# Patient Record
Sex: Female | Born: 1940 | Race: White | Hispanic: No | Marital: Married | Smoking: Former smoker
Health system: Southern US, Community
[De-identification: ages and names within clinical notes are randomized; demographics above are authoritative.]

## PROBLEM LIST (undated history)

## (undated) DIAGNOSIS — M810 Age-related osteoporosis without current pathological fracture: Secondary | ICD-10-CM

## (undated) DIAGNOSIS — I499 Cardiac arrhythmia, unspecified: Secondary | ICD-10-CM

## (undated) DIAGNOSIS — H269 Unspecified cataract: Secondary | ICD-10-CM

## (undated) DIAGNOSIS — I482 Chronic atrial fibrillation, unspecified: Secondary | ICD-10-CM

## (undated) DIAGNOSIS — E039 Hypothyroidism, unspecified: Secondary | ICD-10-CM

## (undated) DIAGNOSIS — N390 Urinary tract infection, site not specified: Secondary | ICD-10-CM

## (undated) DIAGNOSIS — M1990 Unspecified osteoarthritis, unspecified site: Secondary | ICD-10-CM

## (undated) DIAGNOSIS — T7840XA Allergy, unspecified, initial encounter: Secondary | ICD-10-CM

## (undated) DIAGNOSIS — I38 Endocarditis, valve unspecified: Secondary | ICD-10-CM

## (undated) DIAGNOSIS — E079 Disorder of thyroid, unspecified: Secondary | ICD-10-CM

## (undated) HISTORY — DX: Allergy, unspecified, initial encounter: T78.40XA

## (undated) HISTORY — DX: Disorder of thyroid, unspecified: E07.9

## (undated) HISTORY — DX: Unspecified osteoarthritis, unspecified site: M19.90

## (undated) HISTORY — PX: TUBAL LIGATION: SHX77

## (undated) HISTORY — DX: Unspecified cataract: H26.9

## (undated) HISTORY — PX: COLONOSCOPY: SHX174

## (undated) HISTORY — DX: Age-related osteoporosis without current pathological fracture: M81.0

## (undated) HISTORY — DX: Chronic atrial fibrillation, unspecified: I48.20

---

## 2000-04-22 ENCOUNTER — Encounter: Payer: Self-pay | Admitting: Emergency Medicine

## 2000-04-22 ENCOUNTER — Emergency Department (HOSPITAL_COMMUNITY): Admission: EM | Admit: 2000-04-22 | Discharge: 2000-04-22 | Payer: Self-pay | Admitting: Emergency Medicine

## 2012-03-04 ENCOUNTER — Ambulatory Visit (INDEPENDENT_AMBULATORY_CARE_PROVIDER_SITE_OTHER): Payer: Medicare Other | Admitting: Emergency Medicine

## 2012-03-04 VITALS — BP 122/80 | HR 72 | Temp 97.6°F | Resp 16 | Ht 60.5 in | Wt 124.6 lb

## 2012-03-04 DIAGNOSIS — E039 Hypothyroidism, unspecified: Secondary | ICD-10-CM

## 2012-03-04 LAB — TSH: TSH: 96.356 u[IU]/mL — ABNORMAL HIGH (ref 0.350–4.500)

## 2012-03-04 NOTE — Progress Notes (Signed)
   Date:  03/04/2012   Name:  Kelly Mcdowell   DOB:  24-Sep-1940   MRN:  [id number] Gender: female Age: 71 y.o.  PCP:  No primary provider on file.    Chief Complaint: Labs Only   History of Present Illness:  Kelly Mcdowell is a 71 y.o. pleasant patient who presents with the following:  History of hypothyroidism.  Has come in today for labs and a refill on her medications.  Has not had a level done in nearly one year. Not symptomatic.  No complaints.  There is no problem list on file for this patient.   No past medical history on file.  No past surgical history on file.  History  Substance Use Topics  . Smoking status: Former Smoker    Quit date: 03/04/2005  . Smokeless tobacco: Not on file  . Alcohol Use: Not on file    No family history on file.  No Known Allergies  Medication list has been reviewed and updated.  Current Outpatient Prescriptions on File Prior to Visit  Medication Sig Dispense Refill  . Calcium Carbonate-Vitamin D (CALCIUM-VITAMIN D) 500-200 MG-UNIT per tablet Take 1 tablet by mouth 2 (two) times daily with a meal.      . levothyroxine (SYNTHROID, LEVOTHROID) 75 MCG tablet Take 75 mcg by mouth daily.        Review of Systems:  As per HPI, otherwise negative.    Physical Examination: Filed Vitals:   03/04/12 1338  BP: 122/80  Pulse: 72  Temp: 97.6 F (36.4 C)  Resp: 16   Filed Vitals:   03/04/12 1338  Height: 5' 0.5" (1.537 m)  Weight: 124 lb 9.6 oz (56.518 kg)   Body mass index is 23.93 kg/(m^2). Ideal Body Weight: Weight in (lb) to have BMI = 25: 129.9    GEN: WDWN, NAD, Non-toxic, Alert & Oriented x 3 HEENT: Atraumatic, Normocephalic.  Ears and Nose: No external deformity. EXTR: No clubbing/cyanosis/edema NEURO: Normal gait.  PSYCH: Normally interactive. Conversant. Not depressed or anxious appearing.  Calm demeanor.    Assessment and Plan: Hypothyroid Labs Call in med refill based on labs Follow up as needed  Carmelina Dane, MD

## 2012-03-05 ENCOUNTER — Other Ambulatory Visit: Payer: Self-pay | Admitting: Emergency Medicine

## 2012-03-05 MED ORDER — SYNTHROID 125 MCG PO TABS: 125.0000 ug | ORAL_TABLET | Freq: Every day | ORAL | Status: DC

## 2012-06-18 ENCOUNTER — Ambulatory Visit (INDEPENDENT_AMBULATORY_CARE_PROVIDER_SITE_OTHER): Payer: Medicare Other | Admitting: Emergency Medicine

## 2012-06-18 VITALS — BP 102/66 | HR 63 | Temp 98.2°F | Resp 17 | Ht 60.0 in | Wt 125.0 lb

## 2012-06-18 DIAGNOSIS — E039 Hypothyroidism, unspecified: Secondary | ICD-10-CM

## 2012-06-18 LAB — TSH: TSH: 0.043 u[IU]/mL — ABNORMAL LOW (ref 0.350–4.500)

## 2012-06-18 NOTE — Progress Notes (Signed)
[facility] [address] [place] [address] [phone]  Date:  06/18/2012   Name:  Kelly Mcdowell   DOB:  10/18/1940   MRN:  [id number]  PCP:  No primary provider on file.    Chief Complaint: Follow-up   History of Present Illness:  Kelly Mcdowell is a 71 y.o. very pleasant female patient who presents with the following:  Seen in September with TSH over 90 and increased her synthroid.  Now for recheck.  Says she is not as cold as she was but has not detected any difference in skin, hair, energy, fatigue, or mood with increased dose.  Weight is stable.  Not constipated.  Patient Active Problem List  Diagnosis  . Hypothyroidism (acquired)    No past medical history on file.  No past surgical history on file.  History  Substance Use Topics  . Smoking status: Former Smoker    Quit date: 03/04/2005  . Smokeless tobacco: Not on file  . Alcohol Use: Not on file    No family history on file.  No Known Allergies  Medication list has been reviewed and updated.  Current Outpatient Prescriptions on File Prior to Visit  Medication Sig Dispense Refill  . Calcium Carbonate-Vitamin D (CALCIUM-VITAMIN D) 500-200 MG-UNIT per tablet Take 1 tablet by mouth 2 (two) times daily with a meal.      . Multiple Vitamins-Minerals (MULTIVITAMIN WITH MINERALS) tablet Take 1 tablet by mouth daily.      Marland Kitchen SYNTHROID 125 MCG tablet Take 1 tablet (125 mcg total) by mouth daily.  90 tablet  0    Review of Systems:  As per HPI, otherwise negative.    Physical Examination: Filed Vitals:   06/18/12 1045  BP: 102/66  Pulse: 63  Temp: 98.2 F (36.8 C)  Resp: 17   Filed Vitals:   06/18/12 1045  Height: 5' (1.524 m)  Weight: 125 lb (56.7 kg)   Body mass index is 24.41 kg/(m^2). Ideal Body Weight: Weight in (lb) to have BMI = 25: 127.7   GEN: WDWN, NAD, Non-toxic, A & O x 3 HEENT: Atraumatic, Normocephalic. Neck supple. No masses, No LAD. Ears and Nose: No  external deformity. Neck:  No thyromegaly CV: RRR, No M/G/R. No JVD. No thrill. No extra heart sounds. PULM: CTA B, no wheezes, crackles, rhonchi. No retractions. No resp. distress. No accessory muscle use. ABD: S, NT, ND, +BS. No rebound. No HSM. EXTR: No c/c/e NEURO Normal gait.  PSYCH: Normally interactive. Conversant. Not depressed or anxious appearing.  Calm demeanor.    Assessment and Plan: Hypothyroidism Labs Follow up based on labs  Carmelina Dane, MD

## 2012-06-20 ENCOUNTER — Other Ambulatory Visit: Payer: Self-pay | Admitting: Emergency Medicine

## 2012-06-20 MED ORDER — CALCIUM-VITAMIN D 500-200 MG-UNIT PO TABS: 1.0000 | ORAL_TABLET | Freq: Two times a day (BID) | ORAL | Status: DC

## 2012-06-20 MED ORDER — SYNTHROID 100 MCG PO TABS: 100.0000 ug | ORAL_TABLET | Freq: Every day | ORAL | Status: DC

## 2012-10-02 ENCOUNTER — Ambulatory Visit (INDEPENDENT_AMBULATORY_CARE_PROVIDER_SITE_OTHER): Payer: Medicare Other | Admitting: Emergency Medicine

## 2012-10-02 VITALS — BP 112/62 | HR 56 | Temp 98.1°F | Resp 16 | Ht 60.5 in | Wt 125.0 lb

## 2012-10-02 DIAGNOSIS — E039 Hypothyroidism, unspecified: Secondary | ICD-10-CM | POA: Diagnosis not present

## 2012-10-02 LAB — TSH: TSH: 0.092 u[IU]/mL — ABNORMAL LOW (ref 0.350–4.500)

## 2012-10-02 NOTE — Progress Notes (Signed)
Patient here for labs only. 

## 2012-10-04 ENCOUNTER — Other Ambulatory Visit: Payer: Self-pay

## 2012-10-04 MED ORDER — LEVOTHYROXINE SODIUM 88 MCG PO TABS: 88.0000 ug | ORAL_TABLET | Freq: Every day | ORAL | Status: DC

## 2012-10-04 MED ORDER — SYNTHROID 100 MCG PO TABS: 100.0000 ug | ORAL_TABLET | Freq: Every day | ORAL | Status: DC

## 2012-10-04 NOTE — Addendum Note (Signed)
Addended by: Carmelina Dane on: 10/04/2012 03:31 PM   Modules accepted: Orders

## 2012-10-04 NOTE — Telephone Encounter (Signed)
Pt CB and verified that she has been taking the 100 mcg, not the 88 mcg, name brand only. Dr Moishe Spice the 100 mcg in lab results and I have sent in the RFs.

## 2013-06-01 ENCOUNTER — Telehealth: Payer: Self-pay

## 2013-06-01 NOTE — Telephone Encounter (Signed)
Patient calling to refill synthroid. States she called last week and we told her to call pharmacy and have pharmacy send request. She called pharmacy who states they sent request with no response from [place]. Told patient to call pharmacy and make sure they sent it electronically and that I would put in request as well. Knows she needs OV for blood work but she is not feeling well and would like someone to rx one month until she can come in to be seen.

## 2013-06-02 MED ORDER — SYNTHROID 100 MCG PO TABS: 100.0000 ug | ORAL_TABLET | Freq: Every day | ORAL | Status: DC

## 2013-06-02 NOTE — Telephone Encounter (Signed)
There has been nothing from the pharmacy.  I have sent her in a 1 month supply.

## 2013-06-02 NOTE — Telephone Encounter (Signed)
Pt.notified

## 2013-06-05 ENCOUNTER — Ambulatory Visit (INDEPENDENT_AMBULATORY_CARE_PROVIDER_SITE_OTHER): Payer: Medicare Other | Admitting: Family Medicine

## 2013-06-05 ENCOUNTER — Ambulatory Visit: Payer: Medicare Other

## 2013-06-05 VITALS — BP 110/70 | HR 65 | Temp 98.3°F | Resp 18 | Ht 60.5 in | Wt 130.0 lb

## 2013-06-05 DIAGNOSIS — M25559 Pain in unspecified hip: Secondary | ICD-10-CM | POA: Diagnosis not present

## 2013-06-05 DIAGNOSIS — M7062 Trochanteric bursitis, left hip: Secondary | ICD-10-CM

## 2013-06-05 DIAGNOSIS — M25552 Pain in left hip: Secondary | ICD-10-CM

## 2013-06-05 DIAGNOSIS — E039 Hypothyroidism, unspecified: Secondary | ICD-10-CM | POA: Diagnosis not present

## 2013-06-05 MED ORDER — SYNTHROID 100 MCG PO TABS: 100.0000 ug | ORAL_TABLET | Freq: Every day | ORAL | Status: DC

## 2013-06-05 MED ORDER — DICLOFENAC SODIUM 75 MG PO TBEC: 75.0000 mg | DELAYED_RELEASE_TABLET | Freq: Two times a day (BID) | ORAL | Status: DC

## 2013-06-05 NOTE — Patient Instructions (Addendum)
Apply ice tonight and tomorrow as directed  Take diclofenac 75 mg one twice daily for pain and inflammation. You can take Tylenol in addition to that if needed.  Return if not improved  Continue same dose of thyroid medicine. Return as directed or in one year

## 2013-06-05 NOTE — Progress Notes (Signed)
Subjective Patient has been having problems for a couple of months with her left hip hurting her. When she lays on that side it hurts. The pain goes from the lateral area of the hip down into the upper lateral thigh. Knows of no specific injury.  She also is out of her lowering medications. She missed a couple doses this weekend until was called in. She would like a renewed prescription for that. She is overdue to get checked for that.  Objective: Pleasant alert lady in no distress. The thyroid is not enlarged. Chest clear. Heart regular without murmurs. Her abdomen was soft. The left hip is painful laterally and tender over the greater trochanter. Straight leg raise test is negative.  Assessment: Pain left hip, probably trochanteric bursitis Hypothyroidism  Plan: TSH X-ray hip  [facility] reading (PRIMARY) by  Dr. Alwyn Ren Normal pelvis and hip joints.. The greater trochanters on both sides have a little prominence of calcifications consistent with a little calcification of the tendons in that area.   Assessment: Probable trochanter bursitiS    Plan: Inject with cortisone  Procedure note: The surgery was explained to the patient and she agrees, knowing that it may hurt more the next day or 2. She was prepped, using Betadine and alcohol. The area was superficially anesthetized with ethyl chloride. 40 mg of Depo-Medrol and 1 cc of 2% lidocaine were injected without difficulty into the trigger point area of the greater trochanter. This tolerated the procedure well.   She will be given a little course of diclofenac to take for pain and inflammation

## 2013-06-07 ENCOUNTER — Encounter: Payer: Self-pay | Admitting: Family Medicine

## 2013-08-30 ENCOUNTER — Other Ambulatory Visit: Payer: Self-pay | Admitting: Family Medicine

## 2013-08-30 DIAGNOSIS — Z1231 Encounter for screening mammogram for malignant neoplasm of breast: Secondary | ICD-10-CM

## 2013-09-05 ENCOUNTER — Ambulatory Visit (HOSPITAL_COMMUNITY): Payer: Self-pay

## 2013-09-16 DIAGNOSIS — M545 Low back pain, unspecified: Secondary | ICD-10-CM | POA: Diagnosis not present

## 2013-09-16 DIAGNOSIS — M25559 Pain in unspecified hip: Secondary | ICD-10-CM | POA: Diagnosis not present

## 2013-09-16 DIAGNOSIS — M79605 Pain in left leg: Secondary | ICD-10-CM | POA: Insufficient documentation

## 2013-09-18 ENCOUNTER — Ambulatory Visit (HOSPITAL_COMMUNITY): Payer: Self-pay

## 2013-09-23 DIAGNOSIS — M25559 Pain in unspecified hip: Secondary | ICD-10-CM | POA: Diagnosis not present

## 2013-09-23 DIAGNOSIS — M7150 Other bursitis, not elsewhere classified, unspecified site: Secondary | ICD-10-CM | POA: Diagnosis not present

## 2013-09-26 DIAGNOSIS — M25559 Pain in unspecified hip: Secondary | ICD-10-CM | POA: Diagnosis not present

## 2013-09-26 DIAGNOSIS — M7150 Other bursitis, not elsewhere classified, unspecified site: Secondary | ICD-10-CM | POA: Diagnosis not present

## 2014-03-28 DIAGNOSIS — H2510 Age-related nuclear cataract, unspecified eye: Secondary | ICD-10-CM | POA: Diagnosis not present

## 2014-03-28 DIAGNOSIS — H40039 Anatomical narrow angle, unspecified eye: Secondary | ICD-10-CM | POA: Diagnosis not present

## 2014-03-28 DIAGNOSIS — H01029 Squamous blepharitis unspecified eye, unspecified eyelid: Secondary | ICD-10-CM | POA: Diagnosis not present

## 2014-04-04 DIAGNOSIS — H40033 Anatomical narrow angle, bilateral: Secondary | ICD-10-CM | POA: Diagnosis not present

## 2014-04-18 DIAGNOSIS — H40033 Anatomical narrow angle, bilateral: Secondary | ICD-10-CM | POA: Diagnosis not present

## 2014-04-18 DIAGNOSIS — H2513 Age-related nuclear cataract, bilateral: Secondary | ICD-10-CM | POA: Diagnosis not present

## 2014-04-18 DIAGNOSIS — H35373 Puckering of macula, bilateral: Secondary | ICD-10-CM | POA: Diagnosis not present

## 2014-04-18 DIAGNOSIS — H01022 Squamous blepharitis right lower eyelid: Secondary | ICD-10-CM | POA: Diagnosis not present

## 2014-06-21 ENCOUNTER — Other Ambulatory Visit: Payer: Self-pay | Admitting: Family Medicine

## 2014-08-11 ENCOUNTER — Ambulatory Visit (INDEPENDENT_AMBULATORY_CARE_PROVIDER_SITE_OTHER): Payer: Medicare Other | Admitting: Family Medicine

## 2014-08-11 VITALS — BP 116/70 | HR 65 | Temp 97.7°F | Resp 18 | Ht 60.5 in | Wt 118.4 lb

## 2014-08-11 DIAGNOSIS — R002 Palpitations: Secondary | ICD-10-CM

## 2014-08-11 DIAGNOSIS — E039 Hypothyroidism, unspecified: Secondary | ICD-10-CM | POA: Diagnosis not present

## 2014-08-11 DIAGNOSIS — L299 Pruritus, unspecified: Secondary | ICD-10-CM

## 2014-08-11 MED ORDER — SYNTHROID 100 MCG PO TABS: 100.0000 ug | ORAL_TABLET | Freq: Every day | ORAL | Status: DC

## 2014-08-11 MED ORDER — HYDROXYZINE HCL 25 MG PO TABS: ORAL_TABLET | ORAL | Status: DC

## 2014-08-11 NOTE — Progress Notes (Addendum)
[facility] [address], [place] [address] [phone]  Date:  08/11/2014   Name:  Kelly Mcdowell   DOB:  1941-04-25   MRN:  [id number]  PCP:  No primary care provider on file.    Chief Complaint: Follow-up   History of Present Illness:  Kelly Mcdowell is a 74 y.o. very pleasant female patient who presents with the following:  Here today to follow-up her TSH. Last TSH 06/2013.  She is on 100 mcg; she has been on this for about one year and has been taking it consistently.  She feels well, she does not notice any suggestion that her dosage needs adjustment.   Sometimes at night she will feel like she is having heart palpations. She does not notice any chest pain. Palpitations may last 10- 15 minutes.  Sitting up or distracting herself generally helps.  She does not notice these during the day She has noted the palpitations for about 2 months.   She is not aware of any heart trouble. No chest pain with activity.    Patient Active Problem List   Diagnosis Date Noted  . Hypothyroidism (acquired) 06/18/2012    Past Medical History  Diagnosis Date  . Thyroid disease   . Arthritis   . Chronic kidney disease     Past Surgical History  Procedure Laterality Date  . Tubal ligation    . Cesarean section      History  Substance Use Topics  . Smoking status: Former Smoker    Quit date: 03/04/2005  . Smokeless tobacco: Not on file  . Alcohol Use: No    Family History  Problem Relation Age of Onset  . Congestive Heart Failure Father   . Cancer Sister     breast  . Cancer Maternal Grandmother     No Known Allergies  Medication list has been reviewed and updated.  Current Outpatient Prescriptions on File Prior to Visit  Medication Sig Dispense Refill  . Calcium Carbonate-Vitamin D (CALCIUM-VITAMIN D) 500-200 MG-UNIT per tablet Take 1 tablet by mouth 2 (two) times daily with a meal. 60 tablet 5  . diclofenac (VOLTAREN) 75 MG EC tablet Take 1  tablet (75 mg total) by mouth 2 (two) times daily. 20 tablet 0  . Multiple Vitamins-Minerals (MULTIVITAMIN WITH MINERALS) tablet Take 1 tablet by mouth daily.    Marland Kitchen SYNTHROID 100 MCG tablet Take 1 tablet (100 mcg total) by mouth daily. PATIENT NEEDS OFFICE VISIT FOR ADDITIONAL REFILLS 30 tablet 0   No current facility-administered medications on file prior to visit.    Review of Systems:  As per HPI- otherwise negative.   Physical Examination: Filed Vitals:   08/11/14 1419  BP: 116/70  Pulse: 65  Temp: 97.7 F (36.5 C)  Resp: 18   Filed Vitals:   08/11/14 1419  Height: 5' 0.5" (1.537 m)  Weight: 118 lb 6.4 oz (53.706 kg)   Body mass index is 22.73 kg/(m^2). Ideal Body Weight: Weight in (lb) to have BMI = 25: 129.9  GEN: WDWN, NAD, Non-toxic, A & O x 3, slight build, looks well HEENT: Atraumatic, Normocephalic. Neck supple. No masses, No LAD. Ears and Nose: No external deformity. CV: RRR, No M/G/R. No JVD. No thrill. No extra heart sounds. PULM: CTA B, no wheezes, crackles, rhonchi. No retractions. No resp. distress. No accessory muscle use. ABD: S, NT, ND, +BS. No rebound. No HSM. EXTR: No c/c/e NEURO Normal gait.  PSYCH: Normally interactive.  Conversant. Not depressed or anxious appearing.  Calm demeanor.   EKG:  NSR, rate of 59.  q in V2 but no old for comparison  Assessment and Plan: Acquired hypothyroidism - Plan: EKG 12-Lead, SYNTHROID 100 MCG tablet  Palpitations - Plan: TSH  Itching - Plan: hydrOXYzine (ATARAX/VISTARIL) 25 MG tablet  Check TSH and adjust dose if needed. Also refilled her atarax that she uses for occasional itching Palpitations could be due to thyroid issue, await level.  Discussed abnormal EKG with her. It is possible that she could have had an MI.  She doubts this as she has never had any CP.  Offered cardiology referral but she prefers to wait on TSH.  Showed her how to feel her radial pulse. She will check this when she feels palpitations to  see if her rhythm is irregular Will plan further follow- up pending labs.   Signed Abbe AmsterdamJessica Copland, MD  2/9: received her TSH,  Called and LMOM that her level is ok, continue same dosage.  Please let me know if she would like to go ahead with cardiology referral  Results for orders placed or performed in visit on 08/11/14  TSH  Result Value Ref Range   TSH 1.112 0.350 - 4.500 uIU/mL

## 2014-08-11 NOTE — Patient Instructions (Signed)
I will be in touch with your thyroid labs when they come in. Your EKG looks fine- let me know if you want further evaluation for your heart palpitations assuming that your thyroid is normal

## 2014-08-12 ENCOUNTER — Encounter: Payer: Self-pay | Admitting: Family Medicine

## 2014-08-12 LAB — TSH: TSH: 1.112 u[IU]/mL (ref 0.350–4.500)

## 2014-10-29 ENCOUNTER — Telehealth: Payer: Self-pay | Admitting: Family Medicine

## 2014-10-29 ENCOUNTER — Encounter: Payer: Self-pay | Admitting: Family Medicine

## 2014-10-30 NOTE — Telephone Encounter (Signed)
Opened note in error.

## 2015-04-27 DIAGNOSIS — H35373 Puckering of macula, bilateral: Secondary | ICD-10-CM | POA: Diagnosis not present

## 2015-04-27 DIAGNOSIS — H40033 Anatomical narrow angle, bilateral: Secondary | ICD-10-CM | POA: Diagnosis not present

## 2015-04-27 DIAGNOSIS — H2513 Age-related nuclear cataract, bilateral: Secondary | ICD-10-CM | POA: Diagnosis not present

## 2015-09-27 ENCOUNTER — Other Ambulatory Visit: Payer: Self-pay | Admitting: Family Medicine

## 2015-10-28 ENCOUNTER — Ambulatory Visit (INDEPENDENT_AMBULATORY_CARE_PROVIDER_SITE_OTHER): Payer: Medicare Other | Admitting: Physician Assistant

## 2015-10-28 VITALS — BP 112/74 | HR 70 | Temp 97.6°F | Resp 18 | Ht 60.5 in | Wt 120.2 lb

## 2015-10-28 DIAGNOSIS — E039 Hypothyroidism, unspecified: Secondary | ICD-10-CM

## 2015-10-28 LAB — TSH: TSH: 0.95 m[IU]/L

## 2015-10-28 MED ORDER — SYNTHROID 100 MCG PO TABS: 100.0000 ug | ORAL_TABLET | Freq: Every day | ORAL | Status: DC

## 2015-10-28 NOTE — Progress Notes (Signed)
   10/28/2015 12:04 PM   DOB: 1941-05-16 / MRN: [id number]  SUBJECTIVE:  Kelly Mcdowell is a 75 y.o. female presenting for a thyroid check.  She is taking Levothyroxine 100 mcg daily.  CHL reveals no significant change in weight or vitals.  She denies significant hair and nail changes, and does not complain of diarrhea or constipation.   She has No Known Allergies.   She  has a past medical history of Thyroid disease; Arthritis; and Chronic kidney disease.    She  reports that she quit smoking about 10 years ago. She does not have any smokeless tobacco history on file. She reports that she does not drink alcohol or use illicit drugs. She  reports that she does not engage in sexual activity. The patient  has past surgical history that includes Tubal ligation and Cesarean section.  Her family history includes Cancer in her maternal grandmother and sister; Congestive Heart Failure in her father.  ROS   Per HPI  Problem list and medications reviewed and updated by myself where necessary, and exist elsewhere in the encounter.   OBJECTIVE:  BP 112/74 mmHg  Pulse 70  Temp(Src) 97.6 F (36.4 C) (Oral)  Resp 18  Ht 5' 0.5" (1.537 m)  Wt 120 lb 3.2 oz (54.522 kg)  BMI 23.08 kg/m2  SpO2 98%  Physical Exam  Constitutional: She is oriented to person, place, and time. She appears well-nourished. No distress.  Eyes: EOM are normal. Pupils are equal, round, and reactive to light.  Neck: No thyroid mass and no thyromegaly present.  Cardiovascular: Normal rate.   Pulmonary/Chest: Effort normal.  Abdominal: She exhibits no distension.  Neurological: She is alert and oriented to person, place, and time. No cranial nerve deficit. Gait normal.  Skin: Skin is dry. She is not diaphoretic.  Psychiatric: She has a normal mood and affect.  Vitals reviewed.   No results found for this or any previous visit (from the past 72 hour(s)).  No results found.  ASSESSMENT AND PLAN  Para MarchJeanette was seen  today for medication refill.  Diagnoses and all orders for this visit:  Hypothyroidism, unspecified hypothyroidism type: She is doing very well.  Will refill for one year.   -     TSH -     SYNTHROID 100 MCG tablet; Take 1 tablet (100 mcg total) by mouth daily before breakfast. Return to clinic in April 2018 for refills.    The patient was advised to call or return to clinic if she does not see an improvement in symptoms or to seek the care of the closest emergency department if she worsens with the above plan.   Deliah BostonMichael Javious Hallisey, MHS, PA-C [facility] 10/28/2015 12:04 PM

## 2015-10-28 NOTE — Patient Instructions (Signed)
     IF you received an x-ray today, you will receive an invoice from [facility]. Please contact [facility] at [phone] with questions or concerns regarding your invoice.   IF you received labwork today, you will receive an invoice from [facility]. Please contact [facility] at [phone] with questions or concerns regarding your invoice.   Our billing staff will not be able to assist you with questions regarding bills from these companies.  You will be contacted with the lab results as soon as they are available. The fastest way to get your results is to activate your My Chart account. Instructions are located on the last page of this paperwork. If you have not heard from us regarding the results in 2 weeks, please contact this office.      

## 2016-04-27 DIAGNOSIS — H35373 Puckering of macula, bilateral: Secondary | ICD-10-CM | POA: Diagnosis not present

## 2016-04-27 DIAGNOSIS — H2513 Age-related nuclear cataract, bilateral: Secondary | ICD-10-CM | POA: Diagnosis not present

## 2016-04-27 DIAGNOSIS — H40033 Anatomical narrow angle, bilateral: Secondary | ICD-10-CM | POA: Diagnosis not present

## 2016-12-15 DIAGNOSIS — E039 Hypothyroidism, unspecified: Secondary | ICD-10-CM | POA: Insufficient documentation

## 2016-12-15 DIAGNOSIS — Z13820 Encounter for screening for osteoporosis: Secondary | ICD-10-CM | POA: Diagnosis not present

## 2016-12-15 DIAGNOSIS — Z13220 Encounter for screening for lipoid disorders: Secondary | ICD-10-CM | POA: Diagnosis not present

## 2016-12-15 DIAGNOSIS — Z1231 Encounter for screening mammogram for malignant neoplasm of breast: Secondary | ICD-10-CM | POA: Diagnosis not present

## 2016-12-15 DIAGNOSIS — Z1211 Encounter for screening for malignant neoplasm of colon: Secondary | ICD-10-CM | POA: Diagnosis not present

## 2016-12-15 DIAGNOSIS — E559 Vitamin D deficiency, unspecified: Secondary | ICD-10-CM | POA: Diagnosis not present

## 2016-12-16 ENCOUNTER — Other Ambulatory Visit: Payer: Self-pay | Admitting: Nurse Practitioner

## 2016-12-16 DIAGNOSIS — Z1211 Encounter for screening for malignant neoplasm of colon: Secondary | ICD-10-CM

## 2016-12-16 DIAGNOSIS — R5381 Other malaise: Secondary | ICD-10-CM

## 2016-12-24 DIAGNOSIS — E039 Hypothyroidism, unspecified: Secondary | ICD-10-CM | POA: Diagnosis not present

## 2017-03-13 ENCOUNTER — Encounter: Payer: Self-pay | Admitting: Family Medicine

## 2017-03-13 ENCOUNTER — Ambulatory Visit (INDEPENDENT_AMBULATORY_CARE_PROVIDER_SITE_OTHER): Payer: Medicare Other | Admitting: Family Medicine

## 2017-03-13 VITALS — BP 138/68 | HR 63 | Temp 97.7°F | Resp 16 | Ht 60.24 in | Wt 118.0 lb

## 2017-03-13 DIAGNOSIS — Z1239 Encounter for other screening for malignant neoplasm of breast: Secondary | ICD-10-CM

## 2017-03-13 DIAGNOSIS — Z1211 Encounter for screening for malignant neoplasm of colon: Secondary | ICD-10-CM

## 2017-03-13 DIAGNOSIS — Z13820 Encounter for screening for osteoporosis: Secondary | ICD-10-CM | POA: Diagnosis not present

## 2017-03-13 DIAGNOSIS — Z1231 Encounter for screening mammogram for malignant neoplasm of breast: Secondary | ICD-10-CM | POA: Diagnosis not present

## 2017-03-13 DIAGNOSIS — E039 Hypothyroidism, unspecified: Secondary | ICD-10-CM | POA: Diagnosis not present

## 2017-03-13 DIAGNOSIS — E2839 Other primary ovarian failure: Secondary | ICD-10-CM

## 2017-03-13 DIAGNOSIS — Z23 Encounter for immunization: Secondary | ICD-10-CM | POA: Diagnosis not present

## 2017-03-13 MED ORDER — LEVOTHYROXINE SODIUM 100 MCG PO TABS
100.0000 ug | ORAL_TABLET | Freq: Every day | ORAL | 1 refills | Status: DC
Start: 2017-03-13 — End: 2017-03-29

## 2017-03-13 NOTE — Progress Notes (Signed)
Subjective:    Patient ID: Kelly Mcdowell, female    DOB: 10-05-1940, 76 y.o.   MRN: [id number]  HPI Kelly Mcdowell is a 76 y.o. female Presents today for: Chief Complaint  Patient presents with  . Hypothyroidism    follow-up   History of hypothyroidism, here for medication refill and labs. I'm listed as her primary care provider, but no recent visit with me.. Takes Synthroid 100 MCG daily. Was seen by urgent care recently, did have bloodwork and results were ok. Did have generic Rx, but had been taking name brand prior.  Has been on generic  Lab Results  Component Value Date   TSH 0.95 10/28/2015  tsh: 2.31, free t4 2.8 on 12/15/16.   Health maintenance: Reviewed notes from [facility]: Planned to refer for mammogram, bone density, and referred to gastroenterology, but only if she was planning to continue care at that office.   Patient Active Problem List   Diagnosis Date Noted  . Hypothyroidism (acquired) 06/18/2012   Past Medical History:  Diagnosis Date  . Arthritis   . Chronic kidney disease   . Thyroid disease    Past Surgical History:  Procedure Laterality Date  . CESAREAN SECTION    . TUBAL LIGATION     No Known Allergies Prior to Admission medications   Medication Sig Start Date End Date Taking? Authorizing Provider  Calcium Carbonate-Vitamin D (CALCIUM-VITAMIN D) 500-200 MG-UNIT per tablet Take 1 tablet by mouth 2 (two) times daily with a meal. 06/20/12  Yes Carmelina Dane, MD  hydrOXYzine (ATARAX/VISTARIL) 25 MG tablet Take 1/2 tablet to one whole tablet by mouth daily as needed for itching 08/11/14  Yes Copland, Gwenlyn Found, MD  Multiple Vitamins-Minerals (MULTIVITAMIN WITH MINERALS) tablet Take 1 tablet by mouth daily.   Yes [provider]  SYNTHROID 100 MCG tablet Take 1 tablet (100 mcg total) by mouth daily before breakfast. Return to clinic in April 2018 for refills. 10/28/15  Yes Ofilia Neas, PA-C   Social History   Social History    . Marital status: Married    Spouse name: N/A  . Number of children: N/A  . Years of education: N/A   Occupational History  . Not on file.   Social History Main Topics  . Smoking status: Former Smoker    Quit date: 03/04/2005  . Smokeless tobacco: Never Used  . Alcohol use No  . Drug use: No  . Sexual activity: No   Other Topics Concern  . Not on file   Social History Narrative  . No narrative on file     Review of Systems  Constitutional: Negative for fatigue and unexpected weight change.  Respiratory: Negative for chest tightness and shortness of breath.   Cardiovascular: Negative for chest pain, palpitations and leg swelling.  Gastrointestinal: Negative for abdominal pain and blood in stool.  Neurological: Negative for dizziness, syncope, light-headedness and headaches.       Objective:   Physical Exam  Constitutional: She is oriented to person, place, and time. She appears well-developed and well-nourished.  HENT:  Head: Normocephalic and atraumatic.  Eyes: Pupils are equal, round, and reactive to light. Conjunctivae and EOM are normal.  Neck: Neck supple. Carotid bruit is not present. No thyromegaly present.  Cardiovascular: Normal rate, regular rhythm, normal heart sounds and intact distal pulses.   Pulmonary/Chest: Effort normal and breath sounds normal.  Abdominal: Soft. She exhibits no pulsatile midline mass. There is no tenderness.  Neurological: She  is alert and oriented to person, place, and time.  Skin: Skin is warm and dry.  Psychiatric: She has a normal mood and affect. Her behavior is normal.  Vitals reviewed.  Vitals:   03/13/17 1406 03/13/17 1407  BP: (!) 154/63 138/68  Pulse: 63   Resp: 16   Temp: 97.7 F (36.5 C)   TempSrc: Oral   SpO2: 98%   Weight: 118 lb (53.5 kg)   Height: 5' 0.24" (1.53 m)         Assessment & Plan:   Kelly Mcdowell is a 76 y.o. female Hypothyroidism, unspecified type - Plan: TSH, levothyroxine (SYNTHROID)  100 MCG tablet  - Check TSH, continue same dose of Synthroid for now. Suspect TSH will be stable, but as switched from brand name to generic, will check TSH for stability  Screen for colon cancer - Plan: Ambulatory referral to Gastroenterology  Screening for breast cancer - Plan: MM Digital Screening  Need for prophylactic vaccination against Streptococcus pneumoniae (pneumococcus) - Plan: Pneumococcal conjugate vaccine 13-valent IM  Screening for osteoporosis - Plan: DG Bone Density Estrogen deficiency - Plan: DG Bone Density   Meds ordered this encounter  Medications  . levothyroxine (SYNTHROID) 100 MCG tablet    Sig: Take 1 tablet (100 mcg total) by mouth daily before breakfast.    Dispense:  90 tablet    Refill:  1   Patient Instructions   No change in thyroid medication dosing for now. Since you were switched from name brand to generic, I will repeat your thyroid test today. I referred you for colonoscopy, bone density screening, and mammogram. First of 2 pneumonia vaccines given today. Please schedule a physical in the next 3-6 months. If you change your mind on flu vaccine, return here or other location in community prescribing that medication.   IF you received an x-ray today, you will receive an invoice from [facility]. Please contact [facility] at [phone] with questions or concerns regarding your invoice.   IF you received labwork today, you will receive an invoice from [facility]. Please contact [facility] at [phone] with questions or concerns regarding your invoice.   Our billing staff will not be able to assist you with questions regarding bills from these companies.  You will be contacted with the lab results as soon as they are available. The fastest way to get your results is to activate your My Chart account. Instructions are located on the last page of this paperwork. If you have not heard from us regarding the results in 2 weeks, please  contact this office.       I personally performed the services described in this documentation, which was scribed in my presence. The recorded information has been reviewed and considered for accuracy and completeness, addended by me as needed, and agree with information above.  Signed,   Meredith StaggersJeffrey Kortny Lirette, MD [facility].  03/15/17 4:42 PM

## 2017-03-13 NOTE — Patient Instructions (Addendum)
No change in thyroid medication dosing for now. Since you were switched from name brand to generic, I will repeat your thyroid test today. I referred you for colonoscopy, bone density screening, and mammogram. First of 2 pneumonia vaccines given today. Please schedule a physical in the next 3-6 months. If you change your mind on flu vaccine, return here or other location in community prescribing that medication.   IF you received an x-ray today, you will receive an invoice from [facility]. Please contact [facility] at [phone] with questions or concerns regarding your invoice.   IF you received labwork today, you will receive an invoice from [facility]. Please contact [facility] at [phone] with questions or concerns regarding your invoice.   Our billing staff will not be able to assist you with questions regarding bills from these companies.  You will be contacted with the lab results as soon as they are available. The fastest way to get your results is to activate your My Chart account. Instructions are located on the last page of this paperwork. If you have not heard from us regarding the results in 2 weeks, please contact this office.

## 2017-03-14 LAB — TSH: TSH: 0.33 u[IU]/mL — ABNORMAL LOW (ref 0.450–4.500)

## 2017-03-17 ENCOUNTER — Other Ambulatory Visit: Payer: Self-pay | Admitting: Family Medicine

## 2017-03-17 DIAGNOSIS — E2839 Other primary ovarian failure: Secondary | ICD-10-CM

## 2017-03-19 ENCOUNTER — Other Ambulatory Visit: Payer: Self-pay | Admitting: Family Medicine

## 2017-03-19 DIAGNOSIS — E039 Hypothyroidism, unspecified: Secondary | ICD-10-CM

## 2017-03-19 NOTE — Progress Notes (Signed)
Low TSH on recent labs, orders placed for lab only visit.

## 2017-03-21 ENCOUNTER — Ambulatory Visit (INDEPENDENT_AMBULATORY_CARE_PROVIDER_SITE_OTHER): Payer: Medicare Other | Admitting: Family Medicine

## 2017-03-21 DIAGNOSIS — E039 Hypothyroidism, unspecified: Secondary | ICD-10-CM | POA: Diagnosis not present

## 2017-03-22 LAB — TSH+FREE T4
FREE T4: 2.02 ng/dL — AB (ref 0.82–1.77)
TSH: 0.244 u[IU]/mL — AB (ref 0.450–4.500)

## 2017-03-22 LAB — T3, FREE: T3 FREE: 3.1 pg/mL (ref 2.0–4.4)

## 2017-03-24 ENCOUNTER — Encounter: Payer: Self-pay | Admitting: Gastroenterology

## 2017-03-24 NOTE — Progress Notes (Signed)
Lab visit only. 

## 2017-03-29 ENCOUNTER — Other Ambulatory Visit: Payer: Self-pay | Admitting: Family Medicine

## 2017-03-29 DIAGNOSIS — E039 Hypothyroidism, unspecified: Secondary | ICD-10-CM

## 2017-03-29 MED ORDER — LEVOTHYROXINE SODIUM 88 MCG PO TABS: 88.0000 ug | ORAL_TABLET | Freq: Every day | ORAL | 1 refills | Status: DC

## 2017-03-29 NOTE — Progress Notes (Signed)
See lab notes.  Low tsh, elevated free t4. Will decrease to dose of synthroid, recheck levels in 6 weeks.

## 2017-03-30 ENCOUNTER — Ambulatory Visit
Admission: RE | Admit: 2017-03-30 | Discharge: 2017-03-30 | Disposition: A | Discharge status: Home / Self Care | Payer: Medicare Other | Source: Ambulatory Visit | Attending: Family Medicine | Admitting: Family Medicine

## 2017-03-30 DIAGNOSIS — Z1239 Encounter for other screening for malignant neoplasm of breast: Secondary | ICD-10-CM

## 2017-03-30 DIAGNOSIS — Z1231 Encounter for screening mammogram for malignant neoplasm of breast: Secondary | ICD-10-CM | POA: Diagnosis not present

## 2017-04-03 ENCOUNTER — Other Ambulatory Visit: Payer: Self-pay | Admitting: Family Medicine

## 2017-04-03 DIAGNOSIS — R928 Other abnormal and inconclusive findings on diagnostic imaging of breast: Secondary | ICD-10-CM

## 2017-04-06 ENCOUNTER — Ambulatory Visit: Payer: Medicare Other

## 2017-04-06 ENCOUNTER — Ambulatory Visit
Admission: RE | Admit: 2017-04-06 | Discharge: 2017-04-06 | Disposition: A | Discharge status: Home / Self Care | Payer: Medicare Other | Source: Ambulatory Visit | Attending: Family Medicine | Admitting: Family Medicine

## 2017-04-06 DIAGNOSIS — R928 Other abnormal and inconclusive findings on diagnostic imaging of breast: Secondary | ICD-10-CM | POA: Diagnosis not present

## 2017-04-19 ENCOUNTER — Ambulatory Visit
Admission: RE | Admit: 2017-04-19 | Discharge: 2017-04-19 | Disposition: A | Discharge status: Home / Self Care | Payer: Medicare Other | Source: Ambulatory Visit | Attending: Family Medicine | Admitting: Family Medicine

## 2017-04-19 DIAGNOSIS — Z780 Asymptomatic menopausal state: Secondary | ICD-10-CM | POA: Diagnosis not present

## 2017-04-19 DIAGNOSIS — M810 Age-related osteoporosis without current pathological fracture: Secondary | ICD-10-CM | POA: Diagnosis not present

## 2017-04-19 DIAGNOSIS — E2839 Other primary ovarian failure: Secondary | ICD-10-CM

## 2017-04-28 DIAGNOSIS — H35373 Puckering of macula, bilateral: Secondary | ICD-10-CM | POA: Diagnosis not present

## 2017-04-28 DIAGNOSIS — H40033 Anatomical narrow angle, bilateral: Secondary | ICD-10-CM | POA: Diagnosis not present

## 2017-04-28 DIAGNOSIS — H2513 Age-related nuclear cataract, bilateral: Secondary | ICD-10-CM | POA: Diagnosis not present

## 2017-05-02 ENCOUNTER — Ambulatory Visit (INDEPENDENT_AMBULATORY_CARE_PROVIDER_SITE_OTHER): Payer: Medicare Other

## 2017-05-02 ENCOUNTER — Telehealth: Payer: Self-pay

## 2017-05-02 VITALS — BP 140/80 | HR 80 | Temp 97.8°F | Ht 60.0 in | Wt 117.4 lb

## 2017-05-02 DIAGNOSIS — Z0000 Encounter for general adult medical examination without abnormal findings: Secondary | ICD-10-CM | POA: Diagnosis not present

## 2017-05-02 NOTE — Telephone Encounter (Signed)
FYI- Patient was here today for her AWV and I was prepared to order a Lipid, CMP and CBC on this patient. Patient stated that she has an appointment with you next week for recheck on her thyroid and wanted those levels checked also. I explained to patient that thyroid levels are not apart of my standing orders and she wants to have all the blood work drawn at her appointment on 05/09/17 with you so she only gets stuck once.

## 2017-05-02 NOTE — Patient Instructions (Addendum)
Ms. Kelly Mcdowell , Thank you for taking time to come for your Medicare Wellness Visit. I appreciate your ongoing commitment to your health goals. Please review the following plan we discussed and let me know if I can assist you in the future.   Screening recommendations/referrals: Colonoscopy: due, You state you have this scheduled for May 22, 2017. Mammogram: up to date, next due 04/07/2019 Bone Density: up to date, next due 04/19/2022 Recommended yearly ophthalmology/optometry visit for glaucoma screening and checkup Recommended yearly dental visit for hygiene and checkup  Vaccinations: Influenza vaccine: declined Pneumococcal vaccine: up to date, Pneumovax 23 due after 03/13/2018 Tdap vaccine: declined due to insurance Shingles vaccine: Check with your pharmacy about receiving this.     Advanced directives: copy in chart  Conditions/risks identified: Try to improve your posture and hopefully you will be able to stand straight up.    Next appointment: 05/09/17 @ 10:20 am with Dr. Neva SeatGreene   Preventive Care 65 Years and Older, Female Preventive care refers to lifestyle choices and visits with your health care provider that can promote health and wellness. What does preventive care include?  A yearly physical exam. This is also called an annual well check.  Dental exams once or twice a year.  Routine eye exams. Ask your health care provider how often you should have your eyes checked.  Personal lifestyle choices, including:  Daily care of your teeth and gums.  Regular physical activity.  Eating a healthy diet.  Avoiding tobacco and drug use.  Limiting alcohol use.  Practicing safe sex.  Taking low-dose aspirin every day.  Taking vitamin and mineral supplements as recommended by your health care provider. What happens during an annual well check? The services and screenings done by your health care provider during your annual well check will depend on your age, overall health,  lifestyle risk factors, and family history of disease. Counseling  Your health care provider may ask you questions about your:  Alcohol use.  Tobacco use.  Drug use.  Emotional well-being.  Home and relationship well-being.  Sexual activity.  Eating habits.  History of falls.  Memory and ability to understand (cognition).  Work and work Astronomerenvironment.  Reproductive health. Screening  You may have the following tests or measurements:  Height, weight, and BMI.  Blood pressure.  Lipid and cholesterol levels. These may be checked every 5 years, or more frequently if you are over 74 years old.  Skin check.  Lung cancer screening. You may have this screening every year starting at age 74 if you have a 30-pack-year history of smoking and currently smoke or have quit within the past 15 years.  Fecal occult blood test (FOBT) of the stool. You may have this test every year starting at age 74.  Flexible sigmoidoscopy or colonoscopy. You may have a sigmoidoscopy every 5 years or a colonoscopy every 10 years starting at age 74.  Hepatitis C blood test.  Hepatitis B blood test.  Sexually transmitted disease (STD) testing.  Diabetes screening. This is done by checking your blood sugar (glucose) after you have not eaten for a while (fasting). You may have this done every 1-3 years.  Bone density scan. This is done to screen for osteoporosis. You may have this done starting at age 74.  Mammogram. This may be done every 1-2 years. Talk to your health care provider about how often you should have regular mammograms. Talk with your health care provider about your test results, treatment options, and if  necessary, the need for more tests. Vaccines  Your health care provider may recommend certain vaccines, such as:  Influenza vaccine. This is recommended every year.  Tetanus, diphtheria, and acellular pertussis (Tdap, Td) vaccine. You may need a Td booster every 10 years.  Zoster  vaccine. You may need this after age 24.  Pneumococcal 13-valent conjugate (PCV13) vaccine. One dose is recommended after age 27.  Pneumococcal polysaccharide (PPSV23) vaccine. One dose is recommended after age 23. Talk to your health care provider about which screenings and vaccines you need and how often you need them. This information is not intended to replace advice given to you by your health care provider. Make sure you discuss any questions you have with your health care provider. Document Released: 07/17/2015 Document Revised: 03/09/2016 Document Reviewed: 04/21/2015 Elsevier Interactive Patient Education  2017 [place] Prevention in the Home Falls can cause injuries. They can happen to people of all ages. There are many things you can do to make your home safe and to help prevent falls. What can I do on the outside of my home?  Regularly fix the edges of walkways and driveways and fix any cracks.  Remove anything that might make you trip as you walk through a door, such as a raised step or threshold.  Trim any bushes or trees on the path to your home.  Use bright outdoor lighting.  Clear any walking paths of anything that might make someone trip, such as rocks or tools.  Regularly check to see if handrails are loose or broken. Make sure that both sides of any steps have handrails.  Any raised decks and porches should have guardrails on the edges.  Have any leaves, snow, or ice cleared regularly.  Use sand or salt on walking paths during winter.  Clean up any spills in your garage right away. This includes oil or grease spills. What can I do in the bathroom?  Use night lights.  Install grab bars by the toilet and in the tub and shower. Do not use towel bars as grab bars.  Use non-skid mats or decals in the tub or shower.  If you need to sit down in the shower, use a plastic, non-slip stool.  Keep the floor dry. Clean up any water that spills on the  floor as soon as it happens.  Remove soap buildup in the tub or shower regularly.  Attach bath mats securely with double-sided non-slip rug tape.  Do not have throw rugs and other things on the floor that can make you trip. What can I do in the bedroom?  Use night lights.  Make sure that you have a light by your bed that is easy to reach.  Do not use any sheets or blankets that are too big for your bed. They should not hang down onto the floor.  Have a firm chair that has side arms. You can use this for support while you get dressed.  Do not have throw rugs and other things on the floor that can make you trip. What can I do in the kitchen?  Clean up any spills right away.  Avoid walking on wet floors.  Keep items that you use a lot in easy-to-reach places.  If you need to reach something above you, use a strong step stool that has a grab bar.  Keep electrical cords out of the way.  Do not use floor polish or wax that makes floors slippery. If you must  use wax, use non-skid floor wax.  Do not have throw rugs and other things on the floor that can make you trip. What can I do with my stairs?  Do not leave any items on the stairs.  Make sure that there are handrails on both sides of the stairs and use them. Fix handrails that are broken or loose. Make sure that handrails are as long as the stairways.  Check any carpeting to make sure that it is firmly attached to the stairs. Fix any carpet that is loose or worn.  Avoid having throw rugs at the top or bottom of the stairs. If you do have throw rugs, attach them to the floor with carpet tape.  Make sure that you have a light switch at the top of the stairs and the bottom of the stairs. If you do not have them, ask someone to add them for you. What else can I do to help prevent falls?  Wear shoes that:  Do not have high heels.  Have rubber bottoms.  Are comfortable and fit you well.  Are closed at the toe. Do not wear  sandals.  If you use a stepladder:  Make sure that it is fully opened. Do not climb a closed stepladder.  Make sure that both sides of the stepladder are locked into place.  Ask someone to hold it for you, if possible.  Clearly mark and make sure that you can see:  Any grab bars or handrails.  First and last steps.  Where the edge of each step is.  Use tools that help you move around (mobility aids) if they are needed. These include:  Canes.  Walkers.  Scooters.  Crutches.  Turn on the lights when you go into a dark area. Replace any light bulbs as soon as they burn out.  Set up your furniture so you have a clear path. Avoid moving your furniture around.  If any of your floors are uneven, fix them.  If there are any pets around you, be aware of where they are.  Review your medicines with your doctor. Some medicines can make you feel dizzy. This can increase your chance of falling. Ask your doctor what other things that you can do to help prevent falls. This information is not intended to replace advice given to you by your health care provider. Make sure you discuss any questions you have with your health care provider. Document Released: 04/16/2009 Document Revised: 11/26/2015 Document Reviewed: 07/25/2014 Elsevier Interactive Patient Education  2017 [employer].

## 2017-05-02 NOTE — Telephone Encounter (Signed)
No problem. I'm happy to do the blood work next week. I did adjust her Synthroid last visit, so can recheck those levels at that time. Thank you.

## 2017-05-02 NOTE — Progress Notes (Signed)
Subjective:   Kelly Mcdowell is a 76 y.o. female who presents for an Initial Medicare Annual Wellness Visit.  Review of Systems    N/A  Cardiac Risk Factors include: advanced age (>[id number], 39>65 women)     Objective:    Today's Vitals   05/02/17 1027  BP: 140/80  Pulse: 80  Temp: 97.8 F (36.6 C)  TempSrc: Oral  SpO2: 96%  Weight: 117 lb 6 oz (53.2 kg)  Height: 5' (1.524 m)   Body mass index is 22.92 kg/m.   Current Medications (verified) Outpatient Encounter Prescriptions as of 05/02/2017  Medication Sig  . Calcium Carbonate-Vitamin D (CALCIUM-VITAMIN D) 500-200 MG-UNIT per tablet Take 1 tablet by mouth 2 (two) times daily with a meal.  . hydrOXYzine (ATARAX/VISTARIL) 25 MG tablet Take 1/2 tablet to one whole tablet by mouth daily as needed for itching  . levothyroxine (SYNTHROID, LEVOTHROID) 88 MCG tablet Take 1 tablet (88 mcg total) by mouth daily before breakfast.  . Multiple Vitamins-Minerals (MULTIVITAMIN WITH MINERALS) tablet Take 1 tablet by mouth daily.   No facility-administered encounter medications on file as of 05/02/2017.     Allergies (verified) Patient has no known allergies.   History: Past Medical History:  Diagnosis Date  . Arthritis   . Chronic kidney disease   . Thyroid disease    Past Surgical History:  Procedure Laterality Date  . CESAREAN SECTION    . TUBAL LIGATION     Family History  Problem Relation Age of Onset  . Congestive Heart Failure Father   . Cancer Sister        breast  . Breast cancer Sister   . Cancer Maternal Grandmother   . Breast cancer Maternal Aunt    Social History   Occupational History  . Not on file.   Social History Main Topics  . Smoking status: Former Smoker    Quit date: 03/04/2005  . Smokeless tobacco: Never Used  . Alcohol use No  . Drug use: No  . Sexual activity: No    Tobacco Counseling Counseling given: Not Answered   Activities of Daily Living In your present state of health, do  you have any difficulty performing the following activities: 05/02/2017  Hearing? N  Vision? N  Difficulty concentrating or making decisions? N  Walking or climbing stairs? N  Dressing or bathing? N  Doing errands, shopping? N  Preparing Food and eating ? N  Using the Toilet? N  In the past six months, have you accidently leaked urine? Y  Comment Wears a pantyliner sometimes for this issue  Do you have problems with loss of bowel control? Y  Comment has issues with loss of bowel control sometimes  Managing your Medications? N  Managing your Finances? N  Housekeeping or managing your Housekeeping? N  Some recent data might be hidden    Immunizations and Health Maintenance Immunization History  Administered Date(s) Administered  . Pneumococcal Conjugate-13 03/13/2017   Health Maintenance Due  Topic Date Due  . COLONOSCOPY  04/23/1993    Patient Care Team: Shade FloodGreene, Jeffrey R, MD as PCP - General (Family Medicine) Sallye LatGroat, Christopher, MD as Consulting Physician (Ophthalmology)  Indicate any recent Medical Services you may have received from other than [facility] providers in the past year (date may be approximate).     Assessment:   This is a routine wellness examination for Kelly Mcdowell.   Hearing/Vision screen Vision Screening Comments: Patient sees Dr. Dione BoozeGroat for routine eye exams. She goes on  a yearly basis.   Dietary issues and exercise activities discussed: Current Exercise Habits: The patient does not participate in regular exercise at present (stays active at work), Exercise limited by: None identified  Goals    . improve posture          Patient states that she wants to try to improve her posture and be able to stand straight up.       Depression Screen PHQ 2/9 Scores 05/02/2017 03/13/2017 10/28/2015  PHQ - 2 Score 0 0 0    Fall Risk Fall Risk  05/02/2017 03/13/2017 10/28/2015  Falls in the past year? Yes Yes No  Number falls in past yr: 2 or more 1 -  Injury with  Fall? No No -  Risk for fall due to : (No Data) - -  Risk for fall due to: Comment wearing flip flops tripped and fell - -  Follow up Falls prevention discussed - -    Cognitive Function:     6CIT Screen 05/02/2017  What Year? 0 points  What month? 0 points  What time? 0 points  Count back from 20 0 points  Months in reverse 0 points  Repeat phrase 0 points  Total Score 0    Screening Tests Health Maintenance  Topic Date Due  . COLONOSCOPY  04/23/1993  . INFLUENZA VACCINE  05/02/2018 (Originally 02/01/2017)  . TETANUS/TDAP  05/02/2018 (Originally 04/23/1962)  . PNA vac Low Risk Adult (2 of 2 - PPSV23) 03/13/2018  . MAMMOGRAM  03/31/2019  . DEXA SCAN  Completed      Plan:   I have personally reviewed and noted the following in the patient's chart:   . Medical and social history . Use of alcohol, tobacco or illicit drugs  . Current medications and supplements . Functional ability and status . Nutritional status . Physical activity . Advanced directives . List of other physicians . Hospitalizations, surgeries, and ER visits in previous 12 months . Vitals . Screenings to include cognitive, depression, and falls . Referrals and appointments  In addition, I have reviewed and discussed with patient certain preventive protocols, quality metrics, and best practice recommendations. A written personalized care plan for preventive services as well as general preventive health recommendations were provided to patient.  Patient has a colonoscopy scheduled for May 22, 2017.  Patient declined flu vaccine Patient wants to have a blood work completed at her visit next week with her PCP. Message sent to Dr. Neva Seat notifying him of this.    Janalyn Shy, LPN   [id number]

## 2017-05-08 ENCOUNTER — Ambulatory Visit (AMBULATORY_SURGERY_CENTER): Payer: Self-pay | Admitting: *Deleted

## 2017-05-08 VITALS — Ht 60.0 in | Wt 117.8 lb

## 2017-05-08 DIAGNOSIS — Z1211 Encounter for screening for malignant neoplasm of colon: Secondary | ICD-10-CM

## 2017-05-08 MED ORDER — NA SULFATE-K SULFATE-MG SULF 17.5-3.13-1.6 GM/177ML PO SOLN: 1.0000 [IU] | Freq: Once | ORAL | 0 refills | Status: AC

## 2017-05-08 NOTE — Progress Notes (Signed)
No egg or soy allergy known to patient  No issues with past sedation with any surgeries  or procedures, no intubation problems  No diet pills per patient No home 02 use per patient  No blood thinners per patient  Pt denies issues with constipation  No A fib or A flutter  EMMI video sent to pt's e mail  Pt. declined 

## 2017-05-09 ENCOUNTER — Encounter: Payer: Self-pay | Admitting: Family Medicine

## 2017-05-09 ENCOUNTER — Ambulatory Visit (INDEPENDENT_AMBULATORY_CARE_PROVIDER_SITE_OTHER): Payer: Medicare Other | Admitting: Family Medicine

## 2017-05-09 VITALS — BP 128/78 | HR 69 | Temp 98.2°F | Resp 17 | Ht 60.0 in | Wt 118.0 lb

## 2017-05-09 DIAGNOSIS — R9431 Abnormal electrocardiogram [ECG] [EKG]: Secondary | ICD-10-CM | POA: Diagnosis not present

## 2017-05-09 DIAGNOSIS — R55 Syncope and collapse: Secondary | ICD-10-CM | POA: Diagnosis not present

## 2017-05-09 DIAGNOSIS — Z131 Encounter for screening for diabetes mellitus: Secondary | ICD-10-CM

## 2017-05-09 DIAGNOSIS — Z13220 Encounter for screening for lipoid disorders: Secondary | ICD-10-CM

## 2017-05-09 DIAGNOSIS — R002 Palpitations: Secondary | ICD-10-CM

## 2017-05-09 DIAGNOSIS — E039 Hypothyroidism, unspecified: Secondary | ICD-10-CM

## 2017-05-09 NOTE — Progress Notes (Signed)
Subjective:    Patient ID: Kelly Mcdowell, female    DOB: 1941-01-17, 76 y.o.   MRN: [id number]  HPI Kelly Mcdowell is a 77 y.o. female Presents today for: Chief Complaint  Patient presents with  . Follow-up  . Thyroid Problem     TSH 0.95 last year, relatively low at 0.33 on September 10. Repeated at 0.244 with elevated free T4 of 2.02 on September 18. Synthroid was decreased to 88 MCG daily. Here for repeat testing. Feels about the same - has not noticed changes in feeling overall.   Palpitations:  Has fast heart rates at times, but feels like this is occurring less since change in dose. Noticed about once per month prior, only 1 episode since. No chest pain, dizziness or dyspnea during those episodes. Had some palpitations in 2016 - EKG at that time was slightly abnormal, with possible old MI, but declined further workup at that time. No recent EKG.   Plans on screening tests today that were discussed at recent [facility].   Feels like she is going to pass out at times with yawning - only notices few times per year. Symptoms for years, no recent changes.  Osteoporosis: Bone density testing 04/19/17, T score of -3.0 at t spine, -2.3 at left femoral neck. No known hx of osteoporosis.  No known hx of fractures/fragility fractures. No known FH of osteoporosis. Hx of tobacco use, quit 13 years ago. 1 pack per week for 40 years. Taking one a day vitamin unknown amount of calcium and vitamin D.   Wt Readings from Last 3 Encounters:  05/09/17 118 lb (53.5 kg)  05/08/17 117 lb 12.8 oz (53.4 kg)  05/02/17 117 lb 6 oz (53.2 kg)    Patient Active Problem List   Diagnosis Date Noted  . Hypothyroidism (acquired) 06/18/2012   Past Medical History:  Diagnosis Date  . Allergy   . Arthritis   . Cataract    both eyes  . 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 Thyroid disease    Past Surgical History:  Procedure Laterality Date  . CESAREAN SECTION    . TUBAL LIGATION       No Known Allergies Prior to Admission medications   Medication Sig Start Date End Date Taking? Authorizing Provider  GENERIC EXTERNAL MEDICATION 1 tablet daily as needed. As needed for diarrhea/OTC antidiarrheal medication    [provider]  hydrOXYzine (ATARAX/VISTARIL) 25 MG tablet Take 1/2 tablet to one whole tablet by mouth daily as needed for itching Patient not taking: Reported on 05/08/2017 08/11/14   Copland, Gwenlyn Found, MD  ibuprofen (ADVIL,MOTRIN) 400 MG tablet Take 400 mg every 6 (six) hours as needed by mouth.    [provider]  levothyroxine (SYNTHROID, LEVOTHROID) 88 MCG tablet Take 1 tablet (88 mcg total) by mouth daily before breakfast. 03/29/17   [name], MD  Multiple Vitamins-Minerals (MULTIVITAMIN WITH MINERALS) tablet Take 1 tablet by mouth daily.    [provider]   Social History   Socioeconomic History  . Marital status: Married    Spouse name: Not on file  . Number of children: Not on file  . Years of education: Not on file  . Highest education level: Not on file  Social Needs  . Financial resource strain: Not on file  . Food insecurity - worry: Not on file  . Food insecurity - inability: Not on file  . Transportation needs - medical: Not on  file  . Transportation needs - non-medical: Not on file  Occupational History  . Not on file  Tobacco Use  . Smoking status: Former Smoker    Last attempt to quit: 03/04/2005    Years since quitting: 12.1  . Smokeless tobacco: Never Used  Substance and Sexual Activity  . Alcohol use: No  . Drug use: No  . Sexual activity: No    Birth control/protection: Surgical  Other Topics Concern  . Not on file  Social History Narrative  . Not on file   Review of Systems  Constitutional: Negative for fatigue and unexpected weight change.  Respiratory: Negative for chest tightness and shortness of breath.   Cardiovascular: Negative for chest pain, palpitations and leg swelling.   Gastrointestinal: Negative for abdominal pain and blood in stool.  Neurological: Negative for dizziness, syncope, light-headedness and headaches.      Objective:   Physical Exam  Constitutional: She is oriented to person, place, and time. She appears well-developed and well-nourished.  HENT:  Head: Normocephalic and atraumatic.  Eyes: Conjunctivae and EOM are normal. Pupils are equal, round, and reactive to light.  Neck: Trachea normal. No JVD present. Carotid bruit is not present. No thyroid mass and no thyromegaly present.  Cardiovascular: Normal rate, regular rhythm, normal heart sounds and intact distal pulses.  Pulmonary/Chest: Effort normal and breath sounds normal.  Abdominal: Soft. She exhibits no pulsatile midline mass. There is no tenderness.  Neurological: She is alert and oriented to person, place, and time.  Skin: Skin is warm and dry.  Psychiatric: She has a normal mood and affect. Her behavior is normal.  Vitals reviewed.   Vitals:   05/09/17 1028  BP: 128/78  Pulse: 69  Resp: 17  Temp: 98.2 F (36.8 C)  TempSrc: Oral  SpO2: 98%  Weight: 118 lb (53.5 kg)  Height: 5' (1.524 m)   EKG: Sinus bradycardia, flat T-wave in lead III, no apparent acute findings or significant changes from EKG in 2016.    Assessment & Plan:  Kelly Mcdowell is a 76 y.o. female Hypothyroidism, unspecified type - Plan: TSH + free T4  - Discussed TSH, free T4 and interpretation. Recent change in dosing, check stability with labs today.  Heart palpitations - Plan: Comprehensive metabolic panel, CBC, EKG 12-Lead, Ambulatory referral to Cardiology  -Rare symptoms, but with reported episodic near syncope with yawning, would like to have her evaluated by cardiology. Did not hear a carotid bruit, but possible positional vagal symptoms with yawning. Symptoms are intermittent, but RTC/ER precautions discussed  -Repeat TSH as above, check CBC as well.  Nonspecific abnormal electrocardiogram (ECG)  (EKG) - Plan: EKG 12-Lead, Ambulatory referral to Cardiology  -Overall reassuring at current visit, prior EKG had questionable previous MI, but asymptomatic. Suspect normal variant, but as above we'll have evaluated further by cardiology.  Screening for diabetes mellitus - Plan: Comprehensive metabolic panel  Screening for hyperlipidemia - Plan: Lipid panel  Near syncope - Plan: Ambulatory referral to Cardiology  - As above, RTC/ER precautions.  No orders of the defined types were placed in this encounter.  Patient Instructions   I will check thyroid testing again to see if current dose is stable. If so, then repeat testing in 3 months.   For heart palpitations, possible abnormal EKG and the episodic symptoms of feeling like you will pass out with yawning, I would like you to meet with cardiology to see if other testing is recommended. We will refer you. Your EKG  today appears similar to prior one, but looks ok.   I check other blood tests today as well, and we will let you know the results.   For osteoporosis, see information below, including treatment options. Make sure you get at least 1200-1500mg  calcium per day and 800-1000units vitamin D per day. Please follow up in next few weeks to review treatment options further.   Return to the clinic or go to the nearest emergency room if any of your symptoms worsen or new symptoms occur. Let me know.    Osteoporosis Osteoporosis is the thinning and loss of density in the bones. Osteoporosis makes the bones more brittle, fragile, and likely to break (fracture). Over time, osteoporosis can cause the bones to become so weak that they fracture after a simple fall. The bones most likely to fracture are the bones in the hip, wrist, and spine. What are the causes? The exact cause is not known. What increases the risk? Anyone can develop osteoporosis. You may be at greater risk if you have a family history of the condition or have poor nutrition.  You may also have a higher risk if you are:  Female.  34 years old or older.  A smoker.  Not physically active.  White or Asian.  Slender.  What are the signs or symptoms? A fracture might be the first sign of the disease, especially if it results from a fall or injury that would not usually cause a bone to break. Other signs and symptoms include:  Low back and neck pain.  Stooped posture.  Height loss.  How is this diagnosed? To make a diagnosis, your health care provider may:  Take a medical history.  Perform a physical exam.  Order tests, such as: ? A bone mineral density test. ? A dual-energy X-ray absorptiometry test.  How is this treated? The goal of osteoporosis treatment is to strengthen your bones to reduce your risk of a fracture. Treatment may involve:  Making lifestyle changes, such as: ? Eating a diet rich in calcium. ? Doing weight-bearing and muscle-strengthening exercises. ? Stopping tobacco use. ? Limiting alcohol intake.  Taking medicine to slow the process of bone loss or to increase bone density.  Monitoring your levels of calcium and vitamin D.  Follow these instructions at home:  Include calcium and vitamin D in your diet. Calcium is important for bone health, and vitamin D helps the body absorb calcium.  Perform weight-bearing and muscle-strengthening exercises as directed by your health care provider.  Do not use any tobacco products, including cigarettes, chewing tobacco, and electronic cigarettes. If you need help quitting, ask your health care provider.  Limit your alcohol intake.  Take medicines only as directed by your health care provider.  Keep all follow-up visits as directed by your health care provider. This is important.  Take precautions at home to lower your risk of falling, such as: ? Keeping rooms well lit and clutter free. ? Installing safety rails on stairs. ? Using rubber mats in the bathroom and other areas  that are often wet or slippery. Get help right away if: You fall or injure yourself. This information is not intended to replace advice given to you by your health care provider. Make sure you discuss any questions you have with your health care provider. Document Released: 03/30/2005 Document Revised: 11/23/2015 Document Reviewed: 11/28/2013 Elsevier Interactive Patient Education  2017 [employer].    IF you received an x-ray today, you will receive an invoice  from [facility]. Please contact [facility] at [phone] with questions or concerns regarding your invoice.   IF you received labwork today, you will receive an invoice from [facility]. Please contact [facility] at [phone] with questions or concerns regarding your invoice.   Our billing staff will not be able to assist you with questions regarding bills from these companies.  You will be contacted with the lab results as soon as they are available. The fastest way to get your results is to activate your My Chart account. Instructions are located on the last page of this paperwork. If you have not heard from us regarding the results in 2 weeks, please contact this office.       Signed,   Meredith StaggersJeffrey Shanyce Daris, MD Primary Care at [facility].  05/10/17 2:22 PM

## 2017-05-09 NOTE — Patient Instructions (Addendum)
I will check thyroid testing again to see if current dose is stable. If so, then repeat testing in 3 months.   For heart palpitations, possible abnormal EKG and the episodic symptoms of feeling like you will pass out with yawning, I would like you to meet with cardiology to see if other testing is recommended. We will refer you. Your EKG today appears similar to prior one, but looks ok.   I check other blood tests today as well, and we will let you know the results.   For osteoporosis, see information below, including treatment options. Make sure you get at least 1200-1500mg  calcium per day and 800-1000units vitamin D per day. Please follow up in next few weeks to review treatment options further.   Return to the clinic or go to the nearest emergency room if any of your symptoms worsen or new symptoms occur. Let me know.    Osteoporosis Osteoporosis is the thinning and loss of density in the bones. Osteoporosis makes the bones more brittle, fragile, and likely to break (fracture). Over time, osteoporosis can cause the bones to become so weak that they fracture after a simple fall. The bones most likely to fracture are the bones in the hip, wrist, and spine. What are the causes? The exact cause is not known. What increases the risk? Anyone can develop osteoporosis. You may be at greater risk if you have a family history of the condition or have poor nutrition. You may also have a higher risk if you are:  Female.  74 years old or older.  A smoker.  Not physically active.  White or Asian.  Slender.  What are the signs or symptoms? A fracture might be the first sign of the disease, especially if it results from a fall or injury that would not usually cause a bone to break. Other signs and symptoms include:  Low back and neck pain.  Stooped posture.  Height loss.  How is this diagnosed? To make a diagnosis, your health care provider may:  Take a medical history.  Perform a  physical exam.  Order tests, such as: ? A bone mineral density test. ? A dual-energy X-ray absorptiometry test.  How is this treated? The goal of osteoporosis treatment is to strengthen your bones to reduce your risk of a fracture. Treatment may involve:  Making lifestyle changes, such as: ? Eating a diet rich in calcium. ? Doing weight-bearing and muscle-strengthening exercises. ? Stopping tobacco use. ? Limiting alcohol intake.  Taking medicine to slow the process of bone loss or to increase bone density.  Monitoring your levels of calcium and vitamin D.  Follow these instructions at home:  Include calcium and vitamin D in your diet. Calcium is important for bone health, and vitamin D helps the body absorb calcium.  Perform weight-bearing and muscle-strengthening exercises as directed by your health care provider.  Do not use any tobacco products, including cigarettes, chewing tobacco, and electronic cigarettes. If you need help quitting, ask your health care provider.  Limit your alcohol intake.  Take medicines only as directed by your health care provider.  Keep all follow-up visits as directed by your health care provider. This is important.  Take precautions at home to lower your risk of falling, such as: ? Keeping rooms well lit and clutter free. ? Installing safety rails on stairs. ? Using rubber mats in the bathroom and other areas that are often wet or slippery. Get help right away if: You fall or  injure yourself. This information is not intended to replace advice given to you by your health care provider. Make sure you discuss any questions you have with your health care provider. Document Released: 03/30/2005 Document Revised: 11/23/2015 Document Reviewed: 11/28/2013 Elsevier Interactive Patient Education  2017 [employer]    IF you received an x-ray today, you will receive an invoice from [facility]. Please contact [facility] at  [phone] with questions or concerns regarding your invoice.   IF you received labwork today, you will receive an invoice from [facility]. Please contact [facility] at [phone] with questions or concerns regarding your invoice.   Our billing staff will not be able to assist you with questions regarding bills from these companies.  You will be contacted with the lab results as soon as they are available. The fastest way to get your results is to activate your My Chart account. Instructions are located on the last page of this paperwork. If you have not heard from us regarding the results in 2 weeks, please contact this office.

## 2017-05-10 ENCOUNTER — Encounter: Payer: Self-pay | Admitting: Family Medicine

## 2017-05-10 LAB — LIPID PANEL
CHOLESTEROL TOTAL: 271 mg/dL — AB (ref 100–199)
Chol/HDL Ratio: 3.9 ratio (ref 0.0–4.4)
HDL: 70 mg/dL (ref >39)
LDL Calculated: 188 mg/dL — ABNORMAL HIGH (ref 0–99)
TRIGLYCERIDES: 64 mg/dL (ref 0–149)
VLDL Cholesterol Cal: 13 mg/dL (ref 5–40)

## 2017-05-10 LAB — COMPREHENSIVE METABOLIC PANEL
A/G RATIO: 2 (ref 1.2–2.2)
ALT: 15 IU/L (ref 0–32)
AST: 22 IU/L (ref 0–40)
Albumin: 4.5 g/dL (ref 3.5–4.8)
Alkaline Phosphatase: 99 IU/L (ref 39–117)
BUN/Creatinine Ratio: 19 (ref 12–28)
BUN: 15 mg/dL (ref 8–27)
Bilirubin Total: 0.4 mg/dL (ref 0.0–1.2)
CALCIUM: 9.4 mg/dL (ref 8.7–10.3)
CO2: 22 mmol/L (ref 20–29)
CREATININE: 0.79 mg/dL (ref 0.57–1.00)
Chloride: 106 mmol/L (ref 96–106)
GFR, EST AFRICAN AMERICAN: 85 mL/min/{1.73_m2} (ref >59)
GFR, EST NON AFRICAN AMERICAN: 74 mL/min/{1.73_m2} (ref >59)
GLOBULIN, TOTAL: 2.3 g/dL (ref 1.5–4.5)
Glucose: 82 mg/dL (ref 65–99)
POTASSIUM: 4 mmol/L (ref 3.5–5.2)
Sodium: 143 mmol/L (ref 134–144)
TOTAL PROTEIN: 6.8 g/dL (ref 6.0–8.5)

## 2017-05-10 LAB — CBC
HEMATOCRIT: 39.9 % (ref 34.0–46.6)
HEMOGLOBIN: 13.9 g/dL (ref 11.1–15.9)
MCH: 30 pg (ref 26.6–33.0)
MCHC: 34.8 g/dL (ref 31.5–35.7)
MCV: 86 fL (ref 79–97)
Platelets: 301 10*3/uL (ref 150–379)
RBC: 4.64 x10E6/uL (ref 3.77–5.28)
RDW: 14.1 % (ref 12.3–15.4)
WBC: 5.2 10*3/uL (ref 3.4–10.8)

## 2017-05-10 LAB — TSH+FREE T4
Free T4: 1.57 ng/dL (ref 0.82–1.77)
TSH: 3.15 u[IU]/mL (ref 0.450–4.500)

## 2017-05-22 ENCOUNTER — Encounter: Payer: Self-pay | Admitting: Gastroenterology

## 2017-05-22 ENCOUNTER — Ambulatory Visit (AMBULATORY_SURGERY_CENTER): Payer: Medicare Other | Admitting: Gastroenterology

## 2017-05-22 VITALS — BP 112/62 | HR 71 | Temp 97.5°F | Resp 13 | Ht 60.0 in | Wt 117.0 lb

## 2017-05-22 DIAGNOSIS — Z1211 Encounter for screening for malignant neoplasm of colon: Secondary | ICD-10-CM | POA: Diagnosis not present

## 2017-05-22 DIAGNOSIS — Z1212 Encounter for screening for malignant neoplasm of rectum: Secondary | ICD-10-CM | POA: Diagnosis not present

## 2017-05-22 DIAGNOSIS — E039 Hypothyroidism, unspecified: Secondary | ICD-10-CM | POA: Diagnosis not present

## 2017-05-22 MED ORDER — SODIUM CHLORIDE 0.9 % IV SOLN: 500.0000 mL | INTRAVENOUS | Status: DC

## 2017-05-22 NOTE — Op Note (Signed)
[facility] Patient Name: Kelly SantosJeanette Taite Procedure Date: 05/22/2017 10:48 AM MRN: [id number] Endoscopist: Viviann SpareSteven P. Abdulmalik Darco MD, MD Age: 76 Referring MD:  Date of Birth: 1940/09/14 Gender: Female Account #: [id number] Procedure:                Colonoscopy Indications:              Screening for colorectal malignant neoplasm, This                            is the patient's first colonoscopy Medicines:                Monitored Anesthesia Care Procedure:                Pre-Anesthesia Assessment:                           - Prior to the procedure, a History and Physical                            was performed, and patient medications and                            allergies were reviewed. The patient's tolerance of                            previous anesthesia was also reviewed. The risks                            and benefits of the procedure and the sedation                            options and risks were discussed with the patient.                            All questions were answered, and informed consent                            was obtained. Prior Anticoagulants: The patient has                            taken no previous anticoagulant or antiplatelet                            agents. ASA Grade Assessment: II - A patient with                            mild systemic disease. After reviewing the risks                            and benefits, the patient was deemed in                            satisfactory condition to undergo the procedure.  After obtaining informed consent, the colonoscope                            was passed under direct vision. Throughout the                            procedure, the patient's blood pressure, pulse, and                            oxygen saturations were monitored continuously. The                            Model PCF-[id number]) scope was introduced                            through the  anus and advanced to the the cecum,                            identified by appendiceal orifice and ileocecal                            valve. The colonoscopy was performed without                            difficulty. The patient tolerated the procedure                            well. The quality of the bowel preparation was                            adequate. The ileocecal valve, appendiceal orifice,                            and rectum were photographed. Scope In: 10:55:56 AM Scope Out: 11:13:48 AM Scope Withdrawal Time: 0 hours 12 minutes 52 seconds  Total Procedure Duration: 0 hours 17 minutes 52 seconds  Findings:                 The perianal and digital rectal examinations were                            normal.                           A few small-mouthed diverticula were found in the                            sigmoid colon.                           The exam was otherwise without abnormality. No                            polyps or mass lesions noted. Complications:            No immediate complications. Estimated blood  loss:                            None. Estimated Blood Loss:     Estimated blood loss: none. Impression:               - Diverticulosis in the sigmoid colon.                           - The examination was otherwise normal.                           - No specimens collected. Recommendation:           - Patient has a contact number available for                            emergencies. The signs and symptoms of potential                            delayed complications were discussed with the                            patient. Return to normal activities tomorrow.                            Written discharge instructions were provided to the                            patient.                           - Resume previous diet.                           - Continue present medications.                           - No repeat colonoscopy given you will not  need                            further screening for another 10 years, at which                            time you will be 76 years old. an age at which most                            patients stop screening for colon cancer Willaim RayasSteven P. Patsy Varma MD, MD 05/22/2017 11:16:54 AM This report has been signed electronically.

## 2017-05-22 NOTE — Progress Notes (Signed)
No problems noted in the recovery room. maw 

## 2017-05-22 NOTE — Progress Notes (Signed)
To recovery, report to RN, VSS. 

## 2017-05-22 NOTE — Patient Instructions (Signed)
YOU HAD AN ENDOSCOPIC PROCEDURE TODAY AT THE [facility]:   Refer to the procedure report that was given to you for any specific questions about what was found during the examination.  If the procedure report does not answer your questions, please call your gastroenterologist to clarify.  If you requested that your care partner not be given the details of your procedure findings, then the procedure report has been included in a sealed envelope for you to review at your convenience later.  YOU SHOULD EXPECT: Some feelings of bloating in the abdomen. Passage of more gas than usual.  Walking can help get rid of the air that was put into your GI tract during the procedure and reduce the bloating. If you had a lower endoscopy (such as a colonoscopy or flexible sigmoidoscopy) you may notice spotting of blood in your stool or on the toilet paper. If you underwent a bowel prep for your procedure, you may not have a normal bowel movement for a few days.  Please Note:  You might notice some irritation and congestion in your nose or some drainage.  This is from the oxygen used during your procedure.  There is no need for concern and it should clear up in a day or so.  SYMPTOMS TO REPORT IMMEDIATELY:   Following lower endoscopy (colonoscopy or flexible sigmoidoscopy):  Excessive amounts of blood in the stool  Significant tenderness or worsening of abdominal pains  Swelling of the abdomen that is new, acute  Fever of 100F or higher  For urgent or emergent issues, a gastroenterologist can be reached at any hour by calling [phone].   DIET:  We do recommend a small meal at first, but then you may proceed to your regular diet.  Drink plenty of fluids but you should avoid alcoholic beverages for 24 hours.  ACTIVITY:  You should plan to take it easy for the rest of today and you should NOT DRIVE or use heavy machinery until tomorrow (because of the sedation medicines used during the test).     FOLLOW UP: Our staff will call the number listed on your records the next business day following your procedure to check on you and address any questions or concerns that you may have regarding the information given to you following your procedure. If we do not reach you, we will leave a message.  However, if you are feeling well and you are not experiencing any problems, there is no need to return our call.  We will assume that you have returned to your regular daily activities without incident.  If any biopsies were taken you will be contacted by phone or by letter within the next 1-3 weeks.  Please call us at [phone] if you have not heard about the biopsies in 3 weeks.    SIGNATURES/CONFIDENTIALITY: You and/or your care partner have signed paperwork which will be entered into your electronic medical record.  These signatures attest to the fact that that the information above on your After Visit Summary has been reviewed and is understood.  Full responsibility of the confidentiality of this discharge information lies with you and/or your care-partner.    Handouts were given to your care partner on diverticulosis. You may resume your current medications today. No repeat screening colonoscopy due to age. Please call if any questions or concerns.

## 2017-05-23 ENCOUNTER — Telehealth: Payer: Self-pay | Admitting: *Deleted

## 2017-05-23 NOTE — Telephone Encounter (Signed)
  Follow up Call-  Call back number 05/22/2017  Post procedure Call Back phone  # [phone]  Permission to leave phone message Yes  Some recent data might be hidden     Patient questions:  Do you have a fever, pain , or abdominal swelling? No. Pain Score  0 *  Have you tolerated food without any problems? Yes.    Have you been able to return to your normal activities? Yes.    Do you have any questions about your discharge instructions: Diet   No. Medications  No. Follow up visit  No.  Do you have questions or concerns about your Care? No.  Actions: * If pain score is 4 or above: No action needed, pain <4.

## 2017-05-30 ENCOUNTER — Encounter: Payer: Self-pay | Admitting: Family Medicine

## 2017-05-30 ENCOUNTER — Other Ambulatory Visit: Payer: Self-pay

## 2017-05-30 ENCOUNTER — Ambulatory Visit (INDEPENDENT_AMBULATORY_CARE_PROVIDER_SITE_OTHER): Payer: Medicare Other | Admitting: Family Medicine

## 2017-05-30 VITALS — BP 132/62 | HR 60 | Temp 98.0°F | Resp 16 | Ht 60.0 in | Wt 117.6 lb

## 2017-05-30 DIAGNOSIS — L299 Pruritus, unspecified: Secondary | ICD-10-CM | POA: Diagnosis not present

## 2017-05-30 DIAGNOSIS — E039 Hypothyroidism, unspecified: Secondary | ICD-10-CM

## 2017-05-30 DIAGNOSIS — R002 Palpitations: Secondary | ICD-10-CM

## 2017-05-30 DIAGNOSIS — M810 Age-related osteoporosis without current pathological fracture: Secondary | ICD-10-CM | POA: Diagnosis not present

## 2017-05-30 MED ORDER — HYDROXYZINE HCL 25 MG PO TABS: ORAL_TABLET | ORAL | 1 refills | Status: DC

## 2017-05-30 NOTE — Patient Instructions (Addendum)
Keep follow up with cardiology. Im glad to hear the palpitations have improved.   I will refer you to specialist to discuss treatment options further for osteoporosis. Continue at least 1200-1500mg  calcium and at least 800 units of vitamin D per day.   Hydroxyzine refilled, but see info on dry skin and itching below.   Follow up in 6 months for thyroid.   Drink at least 64 ounces of water daily. Consider a humidifier for the room where you sleep. Bathe once daily. Avoid using HOT water, as it dries skin.  Avoid deodorant soaps (Dial is the worst!) and stick with gentle cleansers (I like Cetaphil Liquid Cleanser). After bathing, dry off completely, then apply a thick emollient cream (I like Cetaphil Moisturizing Cream). Apply the cream twice daily, or more!   Osteoporosis Osteoporosis is the thinning and loss of density in the bones. Osteoporosis makes the bones more brittle, fragile, and likely to break (fracture). Over time, osteoporosis can cause the bones to become so weak that they fracture after a simple fall. The bones most likely to fracture are the bones in the hip, wrist, and spine. What are the causes? The exact cause is not known. What increases the risk? Anyone can develop osteoporosis. You may be at greater risk if you have a family history of the condition or have poor nutrition. You may also have a higher risk if you are:  Female.  74 years old or older.  A smoker.  Not physically active.  White or Asian.  Slender.  What are the signs or symptoms? A fracture might be the first sign of the disease, especially if it results from a fall or injury that would not usually cause a bone to break. Other signs and symptoms include:  Low back and neck pain.  Stooped posture.  Height loss.  How is this diagnosed? To make a diagnosis, your health care provider may:  Take a medical history.  Perform a physical exam.  Order tests, such as: ? A bone mineral density  test. ? A dual-energy X-ray absorptiometry test.  How is this treated? The goal of osteoporosis treatment is to strengthen your bones to reduce your risk of a fracture. Treatment may involve:  Making lifestyle changes, such as: ? Eating a diet rich in calcium. ? Doing weight-bearing and muscle-strengthening exercises. ? Stopping tobacco use. ? Limiting alcohol intake.  Taking medicine to slow the process of bone loss or to increase bone density.  Monitoring your levels of calcium and vitamin D.  Follow these instructions at home:  Include calcium and vitamin D in your diet. Calcium is important for bone health, and vitamin D helps the body absorb calcium.  Perform weight-bearing and muscle-strengthening exercises as directed by your health care provider.  Do not use any tobacco products, including cigarettes, chewing tobacco, and electronic cigarettes. If you need help quitting, ask your health care provider.  Limit your alcohol intake.  Take medicines only as directed by your health care provider.  Keep all follow-up visits as directed by your health care provider. This is important.  Take precautions at home to lower your risk of falling, such as: ? Keeping rooms well lit and clutter free. ? Installing safety rails on stairs. ? Using rubber mats in the bathroom and other areas that are often wet or slippery. Get help right away if: You fall or injure yourself. This information is not intended to replace advice given to you by your health care provider. Make  sure you discuss any questions you have with your health care provider. Document Released: 03/30/2005 Document Revised: 11/23/2015 Document Reviewed: 11/28/2013 Elsevier Interactive Patient Education  2017 Elsevier Inc. Pruritus Pruritus is an itching feeling. There are many different conditions and factors that can make your skin itchy. Dry skin is one of the most common causes of itching. Most cases of itching do not  require medical attention. Itchy skin can turn into a rash. Follow these instructions at home: Watch your pruritus for any changes. Take these steps to help with your condition: Skin Care  Moisturize your skin as needed. A moisturizer that contains petroleum jelly is best for keeping moisture in your skin.  Take or apply medicines only as directed by your health care provider. This may include: ? Corticosteroid cream. ? Anti-itch lotions. ? Oral anti-histamines.  Apply cool compresses to the affected areas.  Try taking a bath with: ? Epsom salts. Follow the instructions on the packaging. You can get these at your local pharmacy or grocery store. ? Baking soda. Pour a small amount into the bath as directed by your health care provider. ? Colloidal oatmeal. Follow the instructions on the packaging. You can get this at your local pharmacy or grocery store.  Try applying baking soda paste to your skin. Stir water into baking soda until it reaches a paste-like consistency.  Do not scratch your skin.  Avoid hot showers or baths, which can make itching worse. A cold shower may help with itching as long as you use a moisturizer after.  Avoid scented soaps, detergents, and perfumes. Use gentle soaps, detergents, perfumes, and other cosmetic products. General instructions  Avoid wearing tight clothes.  Keep a journal to help track what causes your itch. Write down: ? What you eat. ? What cosmetic products you use. ? What you drink. ? What you wear. This includes jewelry.  Use a humidifier. This keeps the air moist, which helps to prevent dry skin. Contact a health care provider if:  The itching does not go away after several days.  You sweat at night.  You have weight loss.  You are unusually thirsty.  You urinate more than normal.  You are more tired than normal.  You have abdominal pain.  Your skin tingles.  You feel weak.  Your skin or the whites of your eyes look  yellow (jaundice).  Your skin feels numb. This information is not intended to replace advice given to you by your health care provider. Make sure you discuss any questions you have with your health care provider. Document Released: 03/02/2011 Document Revised: 11/26/2015 Document Reviewed: 06/16/2014 Elsevier Interactive Patient Education  2018 [employer]   IF you received an x-ray today, you will receive an invoice from [facility]. Please contact [facility] at [phone] with questions or concerns regarding your invoice.   IF you received labwork today, you will receive an invoice from [facility]. Please contact [facility] at [phone] with questions or concerns regarding your invoice.   Our billing staff will not be able to assist you with questions regarding bills from these companies.  You will be contacted with the lab results as soon as they are available. The fastest way to get your results is to activate your My Chart account. Instructions are located on the last page of this paperwork. If you have not heard from us regarding the results in 2 weeks, please contact this office.

## 2017-05-30 NOTE — Progress Notes (Signed)
Subjective:  By signing my name below, I, Kelly Mcdowell, attest that this documentation has been prepared under the direction and in the presence of Kelly FloodJeffrey R Maleke Feria, MD Electronically Signed: Charline BillsEssence Mcdowell, ED Scribe 05/30/2017 at 11:05 AM.   Patient ID: Kelly Mcdowell, female    DOB: 1940-11-21, 76 y.o.   MRN: [id number]  Chief Complaint  Patient presents with  . Follow-up    3 month f/u TSH recheck  . Medication Refill    hydroxyzine   HPI Kelly Mcdowell" is a 76 y.o. female who presents to Primary Care at [facility] for follow-up.   Hypothyroidism   Lab Results  Component Value Date   TSH 3.150 05/09/2017  Slightly elevated free T4 of 2.02 9/18. Synthroid decreased to 88 mcg qd.  Heart Palpitations  Discussed last visit on 11/6. Noticed some episodic vagal symptoms with yawning. Referred to cardiology for further eval. Normal CBC and TSH 11/6. [facility]. Pt has noticed decreased heart palpitations, reporting maybe 1-2 within the past week.   Osteoporosis  Bone density 10/17. T score -3.0 at T spine, -2.3 L femoral neck. She is currently taking 1200 mg calcium and 600 mg vitamin D.  Pruritus  Pt uses hydroxyzine for generalized pruritus a few days/week. States pruritis is exacerbated in the winter. She does use a humidifier but heats with wood stove which she states drys the air out.   Patient Active Problem List   Diagnosis Date Noted  . Hypothyroidism (acquired) 06/18/2012   Past Medical History:  Diagnosis Date  . Allergy   . Arthritis   . Cataract    both eyes  . 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 Thyroid disease    Past Surgical History:  Procedure Laterality Date  . CESAREAN SECTION    . TUBAL LIGATION     No Known Allergies Prior to Admission medications   Medication Sig Start Date End Date Taking? Authorizing Provider  Calcium Carb-Cholecalciferol (CALCIUM-VITAMIN D) 500-200 MG-UNIT tablet  Take 500 tablets 1 day or 1 dose by mouth. 2 tablets once daily    [provider]  GENERIC EXTERNAL MEDICATION 1 tablet daily as needed. As needed for diarrhea/OTC antidiarrheal medication    [provider]  hydrOXYzine (ATARAX/VISTARIL) 25 MG tablet Take 1/2 tablet to one whole tablet by mouth daily as needed for itching Patient not taking: Reported on 05/08/2017 08/11/14   Copland, Gwenlyn FoundJessica C, MD  ibuprofen (ADVIL,MOTRIN) 400 MG tablet Take 400 mg every 6 (six) hours as needed by mouth.    [provider]  levothyroxine (SYNTHROID, LEVOTHROID) 88 MCG tablet Take 1 tablet (88 mcg total) by mouth daily before breakfast. 03/29/17   Kelly FloodGreene, Nakima Fluegge R, MD  Multiple Vitamins-Minerals (MULTIVITAMIN WITH MINERALS) tablet Take 1 tablet by mouth daily.    [provider]   Social History   Socioeconomic History  . Marital status: Married    Spouse name: Not on file  . Number of children: Not on file  . Years of education: Not on file  . Highest education level: Not on file  Social Needs  . Financial resource strain: Not on file  . Food insecurity - worry: Not on file  . Food insecurity - inability: Not on file  . Transportation needs - medical: Not on file  . Transportation needs - non-medical: Not on file  Occupational History  . Not on file  Tobacco Use  .  Smoking status: Former Smoker    Last attempt to quit: 03/04/2005    Years since quitting: 12.2  . Smokeless tobacco: Never Used  Substance and Sexual Activity  . Alcohol use: No  . Drug use: No  . Sexual activity: No    Birth control/protection: Surgical  Other Topics Concern  . Not on file  Social History Narrative  . Not on file   Review of Systems  Cardiovascular: Positive for palpitations (improving).      Objective:   Physical Exam  Constitutional: She is oriented to person, place, and time. She appears well-developed and well-nourished. No distress.  HENT:  Head: Normocephalic and  atraumatic.  Eyes: Conjunctivae and EOM are normal. Pupils are equal, round, and reactive to light.  Neck: Neck supple. Carotid bruit is not present. No tracheal deviation present.  Cardiovascular: Normal rate, regular rhythm, normal heart sounds and intact distal pulses.  Pulmonary/Chest: Effort normal and breath sounds normal. No respiratory distress.  Abdominal: Soft. She exhibits no pulsatile midline mass. There is no tenderness.  Musculoskeletal: Normal range of motion.  Neurological: She is alert and oriented to person, place, and time.  Skin: Skin is warm and dry.  Dry skin with few excoriated areas on LE, L greater than R.  Psychiatric: She has a normal mood and affect. Her behavior is normal.  Nursing note and vitals reviewed.  Vitals:   05/30/17 1037  BP: 132/62  Pulse: 60  Resp: 16  Temp: 98 F (36.7 C)  TempSrc: Oral  SpO2: 98%  Weight: 117 lb 9.6 oz (53.3 kg)  Height: 5' (1.524 m)      Assessment & Plan:    Kelly Mcdowell is a 76 y.o. female Heart palpitations  - Improved/resolving. May have been related to abnormal TSH. Still would recommend continued follow-up with cardiology given her prior vagal symptoms with yawning and abnormal EKG. RTC precautions/ER precautions if worsening  Hypothyroidism, unspecified type  -Stable on new dose by last TSH. Continue same dose of 88 g daily, recheck in 6 months  Itching - Plan: hydrOXYzine (ATARAX/VISTARIL) 25 MG tablet  Suspect component of dry skin dermatitis. Refilled hydroxyzine, but other avoidance measures discussed with sufficient fluids, humidifier, lotion application, and avoidance of drying soaps  Osteoporosis without current pathological fracture, unspecified osteoporosis type - Plan: Ambulatory referral to Endocrinology  -Handout given with various treatment options last visit, discussed again weekly versus yearly therapy or other treatments. Would like to meet with endocrinology to discuss options further.  Can decide if other testing needed there as well. Referral placed  Meds ordered this encounter  Medications  . hydrOXYzine (ATARAX/VISTARIL) 25 MG tablet    Sig: Take 1/2 tablet to one whole tablet by mouth daily as needed for itching    Dispense:  30 tablet    Refill:  1   Patient Instructions   Keep follow up with cardiology. Im glad to hear the palpitations have improved.   I will refer you to specialist to discuss treatment options further for osteoporosis. Continue at least 1200-1500mg  calcium and at least 800 units of vitamin D per day.   Hydroxyzine refilled, but see info on dry skin and itching below.   Follow up in 6 months for thyroid.   Drink at least 64 ounces of water daily. Consider a humidifier for the room where you sleep. Bathe once daily. Avoid using HOT water, as it dries skin.  Avoid deodorant soaps (Dial is the worst!) and stick with gentle  cleansers (I like Cetaphil Liquid Cleanser). After bathing, dry off completely, then apply a thick emollient cream (I like Cetaphil Moisturizing Cream). Apply the cream twice daily, or more!   Osteoporosis Osteoporosis is the thinning and loss of density in the bones. Osteoporosis makes the bones more brittle, fragile, and likely to break (fracture). Over time, osteoporosis can cause the bones to become so weak that they fracture after a simple fall. The bones most likely to fracture are the bones in the hip, wrist, and spine. What are the causes? The exact cause is not known. What increases the risk? Anyone can develop osteoporosis. You may be at greater risk if you have a family history of the condition or have poor nutrition. You may also have a higher risk if you are:  Female.  41 years old or older.  A smoker.  Not physically active.  White or Asian.  Slender.  What are the signs or symptoms? A fracture might be the first sign of the disease, especially if it results from a fall or injury that would not  usually cause a bone to break. Other signs and symptoms include:  Low back and neck pain.  Stooped posture.  Height loss.  How is this diagnosed? To make a diagnosis, your health care provider may:  Take a medical history.  Perform a physical exam.  Order tests, such as: ? A bone mineral density test. ? A dual-energy X-ray absorptiometry test.  How is this treated? The goal of osteoporosis treatment is to strengthen your bones to reduce your risk of a fracture. Treatment may involve:  Making lifestyle changes, such as: ? Eating a diet rich in calcium. ? Doing weight-bearing and muscle-strengthening exercises. ? Stopping tobacco use. ? Limiting alcohol intake.  Taking medicine to slow the process of bone loss or to increase bone density.  Monitoring your levels of calcium and vitamin D.  Follow these instructions at home:  Include calcium and vitamin D in your diet. Calcium is important for bone health, and vitamin D helps the body absorb calcium.  Perform weight-bearing and muscle-strengthening exercises as directed by your health care provider.  Do not use any tobacco products, including cigarettes, chewing tobacco, and electronic cigarettes. If you need help quitting, ask your health care provider.  Limit your alcohol intake.  Take medicines only as directed by your health care provider.  Keep all follow-up visits as directed by your health care provider. This is important.  Take precautions at home to lower your risk of falling, such as: ? Keeping rooms well lit and clutter free. ? Installing safety rails on stairs. ? Using rubber mats in the bathroom and other areas that are often wet or slippery. Get help right away if: You fall or injure yourself. This information is not intended to replace advice given to you by your health care provider. Make sure you discuss any questions you have with your health care provider. Document Released: 03/30/2005 Document  Revised: 11/23/2015 Document Reviewed: 11/28/2013 Elsevier Interactive Patient Education  2017 Elsevier Inc. Pruritus Pruritus is an itching feeling. There are many different conditions and factors that can make your skin itchy. Dry skin is one of the most common causes of itching. Most cases of itching do not require medical attention. Itchy skin can turn into a rash. Follow these instructions at home: Watch your pruritus for any changes. Take these steps to help with your condition: Skin Care  Moisturize your skin as needed. A moisturizer that contains  petroleum jelly is best for keeping moisture in your skin.  Take or apply medicines only as directed by your health care provider. This may include: ? Corticosteroid cream. ? Anti-itch lotions. ? Oral anti-histamines.  Apply cool compresses to the affected areas.  Try taking a bath with: ? Epsom salts. Follow the instructions on the packaging. You can get these at your local pharmacy or grocery store. ? Baking soda. Pour a small amount into the bath as directed by your health care provider. ? Colloidal oatmeal. Follow the instructions on the packaging. You can get this at your local pharmacy or grocery store.  Try applying baking soda paste to your skin. Stir water into baking soda until it reaches a paste-like consistency.  Do not scratch your skin.  Avoid hot showers or baths, which can make itching worse. A cold shower may help with itching as long as you use a moisturizer after.  Avoid scented soaps, detergents, and perfumes. Use gentle soaps, detergents, perfumes, and other cosmetic products. General instructions  Avoid wearing tight clothes.  Keep a journal to help track what causes your itch. Write down: ? What you eat. ? What cosmetic products you use. ? What you drink. ? What you wear. This includes jewelry.  Use a humidifier. This keeps the air moist, which helps to prevent dry skin. Contact a health care provider  if:  The itching does not go away after several days.  You sweat at night.  You have weight loss.  You are unusually thirsty.  You urinate more than normal.  You are more tired than normal.  You have abdominal pain.  Your skin tingles.  You feel weak.  Your skin or the whites of your eyes look yellow (jaundice).  Your skin feels numb. This information is not intended to replace advice given to you by your health care provider. Make sure you discuss any questions you have with your health care provider. Document Released: 03/02/2011 Document Revised: 11/26/2015 Document Reviewed: 06/16/2014 Elsevier Interactive Patient Education  2018 [employer]   IF you received an x-ray today, you will receive an invoice from [facility]. Please contact [facility] at [phone] with questions or concerns regarding your invoice.   IF you received labwork today, you will receive an invoice from [facility]. Please contact [facility] at [phone] with questions or concerns regarding your invoice.   Our billing staff will not be able to assist you with questions regarding bills from these companies.  You will be contacted with the lab results as soon as they are available. The fastest way to get your results is to activate your My Chart account. Instructions are located on the last page of this paperwork. If you have not heard from us regarding the results in 2 weeks, please contact this office.      I personally performed the services described in this documentation, which was scribed in my presence. The recorded information has been reviewed and considered for accuracy and completeness, addended by me as needed, and agree with information above.  Signed,   Meredith StaggersJeffrey Randalyn Ahmed, MD Primary Care at [facility].  05/30/17 12:01 PM

## 2017-06-13 ENCOUNTER — Encounter: Payer: Medicare Other | Admitting: Family Medicine

## 2017-11-28 ENCOUNTER — Ambulatory Visit: Payer: Medicare Other | Admitting: Family Medicine

## 2017-12-01 ENCOUNTER — Ambulatory Visit (INDEPENDENT_AMBULATORY_CARE_PROVIDER_SITE_OTHER): Payer: Medicare Other | Admitting: Family Medicine

## 2017-12-01 ENCOUNTER — Encounter: Payer: Self-pay | Admitting: Family Medicine

## 2017-12-01 VITALS — BP 134/66 | HR 62 | Temp 98.3°F | Resp 17 | Ht 60.0 in | Wt 120.0 lb

## 2017-12-01 DIAGNOSIS — E039 Hypothyroidism, unspecified: Secondary | ICD-10-CM | POA: Diagnosis not present

## 2017-12-01 MED ORDER — LEVOTHYROXINE SODIUM 88 MCG PO TABS: 88.0000 ug | ORAL_TABLET | Freq: Every day | ORAL | 1 refills | Status: DC

## 2017-12-01 NOTE — Progress Notes (Signed)
Subjective:  By signing my name below, I, Kelly Mcdowell, attest that this documentation has been prepared under the direction and in the presence of [name], MD Electronically Signed: Charline Bills, ED Scribe 12/01/2017 at 11:42 AM.   Patient ID: Kelly Mcdowell, female    DOB: 10-11-1940, 76 y.o.   MRN: [id number]  Chief Complaint  Patient presents with  . recheck    thyroid    HPI Kelly Mcdowell is a 77 y.o. female who presents to Primary Care at [facility] for f/u of hypothyroidism. I did see her in Nov. She did have prev palpitations, referred to cardiology. Palpitations were decreasing at last OV. We decreased her synthroid to 88 mcg qd. - Pt states palpitations have improved overall and are very seldom. She never received a call from cardiology. Denies heat/cold intolerance, fatigue, changes in weight, skin, hair, nails.  Lab Results  Component Value Date   TSH 3.150 05/09/2017    Patient Active Problem List   Diagnosis Date Noted  . Hypothyroidism (acquired) 06/18/2012   Past Medical History:  Diagnosis Date  . Allergy   . Arthritis   . Cataract    both eyes  . 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 Thyroid disease    Past Surgical History:  Procedure Laterality Date  . CESAREAN SECTION    . TUBAL LIGATION     No Known Allergies Prior to Admission medications   Medication Sig Start Date End Date Taking? Authorizing Provider  Calcium Carb-Cholecalciferol (CALCIUM-VITAMIN D) 500-200 MG-UNIT tablet Take 1 tablet by mouth 1 day or 1 dose. 2 tablets once daily    [provider]  GENERIC EXTERNAL MEDICATION 1 tablet daily as needed. As needed for diarrhea/OTC antidiarrheal medication    [provider]  hydrOXYzine (ATARAX/VISTARIL) 25 MG tablet Take 1/2 tablet to one whole tablet by mouth daily as needed for itching 05/30/17   [name], MD  ibuprofen (ADVIL,MOTRIN) 400 MG tablet Take 400 mg every 6 (six) hours as  needed by mouth.    [provider]  levothyroxine (SYNTHROID, LEVOTHROID) 88 MCG tablet Take 1 tablet (88 mcg total) by mouth daily before breakfast. 03/29/17   [name], MD  Multiple Vitamins-Minerals (MULTIVITAMIN WITH MINERALS) tablet Take 1 tablet by mouth daily.    [provider]   Social History   Socioeconomic History  . Marital status: Married    Spouse name: Not on file  . Number of children: Not on file  . Years of education: Not on file  . Highest education level: Not on file  Occupational History  . Not on file  Social Needs  . Financial resource strain: Not on file  . Food insecurity:    Worry: Not on file    Inability: Not on file  . Transportation needs:    Medical: Not on file    Non-medical: Not on file  Tobacco Use  . Smoking status: Former Smoker    Last attempt to quit: 03/04/2005    Years since quitting: 12.7  . Smokeless tobacco: Never Used  Substance and Sexual Activity  . Alcohol use: No  . Drug use: No  . Sexual activity: Never    Birth control/protection: Surgical  Lifestyle  . Physical activity:    Days per week: Not on file    Minutes per session: Not on file  . Stress: Not on file  Relationships  . Social connections:  Talks on phone: Not on file    Gets together: Not on file    Attends religious service: Not on file    Active member of club or organization: Not on file    Attends meetings of clubs or organizations: Not on file    Relationship status: Not on file  . Intimate partner violence:    Fear of current or ex partner: Not on file    Emotionally abused: Not on file    Physically abused: Not on file    Forced sexual activity: Not on file  Other Topics Concern  . Not on file  Social History Narrative  . Not on file   Review of Systems  Constitutional: Negative for fatigue and unexpected weight change.  Endocrine: Negative for cold intolerance and heat intolerance.  Skin: Negative for color change.        Objective:   Physical Exam  Constitutional: She is oriented to person, place, and time. She appears well-developed and well-nourished. No distress.  HENT:  Head: Normocephalic and atraumatic.  Eyes: Pupils are equal, round, and reactive to light. Conjunctivae and EOM are normal.  Neck: Neck supple. Carotid bruit is not present. No tracheal deviation present. No thyroid mass and no thyromegaly present.  Cardiovascular: Normal rate, regular rhythm, normal heart sounds and intact distal pulses.  Pulmonary/Chest: Effort normal and breath sounds normal. No respiratory distress.  Abdominal: Soft. She exhibits no pulsatile midline mass. There is no tenderness.  Musculoskeletal: Normal range of motion.  Neurological: She is alert and oriented to person, place, and time.  Skin: Skin is warm and dry.  Psychiatric: She has a normal mood and affect. Her behavior is normal.  Nursing note and vitals reviewed.  Vitals:   12/01/17 1108  BP: 134/66  Pulse: 62  Resp: 17  Temp: 98.3 F (36.8 C)  TempSrc: Oral  SpO2: 98%  Weight: 120 lb (54.4 kg)  Height: 5' (1.524 m)      Assessment & Plan:    Kelly Mcdowell is a 77 y.o. female Hypothyroidism, unspecified type - Plan: TSH + free T4, levothyroxine (SYNTHROID, LEVOTHROID) 88 MCG tablet  -Tolerating current dose of Synthroid, has had decrease in palpitations, possibly slightly overtreated previously.  Continue same dose of 88 mcg, check TSH, free T4, repeat evaluation in 6 months.  Meds ordered this encounter  Medications  . levothyroxine (SYNTHROID, LEVOTHROID) 88 MCG tablet    Sig: Take 1 tablet (88 mcg total) by mouth daily before breakfast.    Dispense:  90 tablet    Refill:  1   Patient Instructions   I'm glad to hear that the heart palpitations have improved. I will recheck your thyroid tests today. No med changes for now as current dose appears stable.   Thank you for coming in today. Recheck in 6 months.     Hypothyroidism Hypothyroidism is a disorder of the thyroid. The thyroid is a large gland that is located in the lower front of the neck. The thyroid releases hormones that control how the body works. With hypothyroidism, the thyroid does not make enough of these hormones. What are the causes? Causes of hypothyroidism may include:  Viral infections.  Pregnancy.  Your own defense system (immune system) attacking your thyroid.  Certain medicines.  Birth defects.  Past radiation treatments to your head or neck.  Past treatment with radioactive iodine.  Past surgical removal of part or all of your thyroid.  Problems with the gland that is located  in the center of your brain (pituitary).  What are the signs or symptoms? Signs and symptoms of hypothyroidism may include:  Feeling as though you have no energy (lethargy).  Inability to tolerate cold.  Weight gain that is not explained by a change in diet or exercise habits.  Dry skin.  Coarse hair.  Menstrual irregularity.  Slowing of thought processes.  Constipation.  Sadness or depression.  How is this diagnosed? Your health care provider may diagnose hypothyroidism with blood tests and ultrasound tests. How is this treated? Hypothyroidism is treated with medicine that replaces the hormones that your body does not make. After you begin treatment, it may take several weeks for symptoms to go away. Follow these instructions at home:  Take medicines only as directed by your health care provider.  If you start taking any new medicines, tell your health care provider.  Keep all follow-up visits as directed by your health care provider. This is important. As your condition improves, your dosage needs may change. You will need to have blood tests regularly so that your health care provider can watch your condition. Contact a health care provider if:  Your symptoms do not get better with treatment.  You are taking  thyroid replacement medicine and: ? You sweat excessively. ? You have tremors. ? You feel anxious. ? You lose weight rapidly. ? You cannot tolerate heat. ? You have emotional swings. ? You have diarrhea. ? You feel weak. Get help right away if:  You develop chest pain.  You develop an irregular heartbeat.  You develop a rapid heartbeat. This information is not intended to replace advice given to you by your health care provider. Make sure you discuss any questions you have with your health care provider. Document Released: 06/20/2005 Document Revised: 11/26/2015 Document Reviewed: 11/05/2013 Elsevier Interactive Patient Education  2018 [employer].  IF you received an x-ray today, you will receive an invoice from [facility]. Please contact [facility] at [phone] with questions or concerns regarding your invoice.   IF you received labwork today, you will receive an invoice from [facility]. Please contact [facility] at [phone] with questions or concerns regarding your invoice.   Our billing staff will not be able to assist you with questions regarding bills from these companies.  You will be contacted with the lab results as soon as they are available. The fastest way to get your results is to activate your My Chart account. Instructions are located on the last page of this paperwork. If you have not heard from [place] regarding the results in 2 weeks, please contact this office.       I personally performed the services described in this documentation, which was scribed in my presence. The recorded information has been reviewed and considered for accuracy and completeness, addended by me as needed, and agree with information above.  Signed,   Meredith Staggers, MD Primary Care at [facility].  12/01/17 7:20 PM

## 2017-12-01 NOTE — Patient Instructions (Addendum)
I'm glad to hear that the heart palpitations have improved. I will recheck your thyroid tests today. No med changes for now as current dose appears stable.   Thank you for coming in today. Recheck in 6 months.    Hypothyroidism Hypothyroidism is a disorder of the thyroid. The thyroid is a large gland that is located in the lower front of the neck. The thyroid releases hormones that control how the body works. With hypothyroidism, the thyroid does not make enough of these hormones. What are the causes? Causes of hypothyroidism may include:  Viral infections.  Pregnancy.  Your own defense system (immune system) attacking your thyroid.  Certain medicines.  Birth defects.  Past radiation treatments to your head or neck.  Past treatment with radioactive iodine.  Past surgical removal of part or all of your thyroid.  Problems with the gland that is located in the center of your brain (pituitary).  What are the signs or symptoms? Signs and symptoms of hypothyroidism may include:  Feeling as though you have no energy (lethargy).  Inability to tolerate cold.  Weight gain that is not explained by a change in diet or exercise habits.  Dry skin.  Coarse hair.  Menstrual irregularity.  Slowing of thought processes.  Constipation.  Sadness or depression.  How is this diagnosed? Your health care provider may diagnose hypothyroidism with blood tests and ultrasound tests. How is this treated? Hypothyroidism is treated with medicine that replaces the hormones that your body does not make. After you begin treatment, it may take several weeks for symptoms to go away. Follow these instructions at home:  Take medicines only as directed by your health care provider.  If you start taking any new medicines, tell your health care provider.  Keep all follow-up visits as directed by your health care provider. This is important. As your condition improves, your dosage needs may change.  You will need to have blood tests regularly so that your health care provider can watch your condition. Contact a health care provider if:  Your symptoms do not get better with treatment.  You are taking thyroid replacement medicine and: ? You sweat excessively. ? You have tremors. ? You feel anxious. ? You lose weight rapidly. ? You cannot tolerate heat. ? You have emotional swings. ? You have diarrhea. ? You feel weak. Get help right away if:  You develop chest pain.  You develop an irregular heartbeat.  You develop a rapid heartbeat. This information is not intended to replace advice given to you by your health care provider. Make sure you discuss any questions you have with your health care provider. Document Released: 06/20/2005 Document Revised: 11/26/2015 Document Reviewed: 11/05/2013 Elsevier Interactive Patient Education  2018 [employer].  IF you received an x-ray today, you will receive an invoice from [facility]. Please contact [facility] at [phone] with questions or concerns regarding your invoice.   IF you received labwork today, you will receive an invoice from [facility]. Please contact [facility] at [phone] with questions or concerns regarding your invoice.   Our billing staff will not be able to assist you with questions regarding bills from these companies.  You will be contacted with the lab results as soon as they are available. The fastest way to get your results is to activate your My Chart account. Instructions are located on the last page of this paperwork. If you have not heard from [place] regarding the results in 2 weeks, please contact this office.

## 2017-12-02 LAB — TSH+FREE T4
Free T4: 1.31 ng/dL (ref 0.82–1.77)
TSH: 2.12 u[IU]/mL (ref 0.450–4.500)

## 2017-12-11 ENCOUNTER — Encounter: Payer: Self-pay | Admitting: *Deleted

## 2018-05-01 DIAGNOSIS — H40033 Anatomical narrow angle, bilateral: Secondary | ICD-10-CM | POA: Diagnosis not present

## 2018-05-01 DIAGNOSIS — H35373 Puckering of macula, bilateral: Secondary | ICD-10-CM | POA: Diagnosis not present

## 2018-05-01 DIAGNOSIS — H2513 Age-related nuclear cataract, bilateral: Secondary | ICD-10-CM | POA: Diagnosis not present

## 2018-06-05 ENCOUNTER — Encounter: Payer: Self-pay | Admitting: Family Medicine

## 2018-06-05 ENCOUNTER — Ambulatory Visit (INDEPENDENT_AMBULATORY_CARE_PROVIDER_SITE_OTHER): Payer: Medicare Other | Admitting: Family Medicine

## 2018-06-05 VITALS — BP 144/74 | HR 66 | Temp 97.8°F | Ht 61.5 in | Wt 120.8 lb

## 2018-06-05 DIAGNOSIS — E039 Hypothyroidism, unspecified: Secondary | ICD-10-CM

## 2018-06-05 DIAGNOSIS — R030 Elevated blood-pressure reading, without diagnosis of hypertension: Secondary | ICD-10-CM | POA: Diagnosis not present

## 2018-06-05 DIAGNOSIS — R12 Heartburn: Secondary | ICD-10-CM | POA: Diagnosis not present

## 2018-06-05 DIAGNOSIS — R002 Palpitations: Secondary | ICD-10-CM | POA: Diagnosis not present

## 2018-06-05 MED ORDER — LEVOTHYROXINE SODIUM 88 MCG PO TABS: 88.0000 ug | ORAL_TABLET | Freq: Every day | ORAL | 2 refills | Status: DC

## 2018-06-05 NOTE — Patient Instructions (Addendum)
Blood pressure slightly elevated in the office today.  Previous visits it had been normal.  You can check that outside of the office with instructions below.  If you notice readings above 140/90, let me know. I will check thyroid test, but if heart palpitations return, I would like you to meet with cardiology.   For heartburn and belching - see foods to avoid below.  If needed you can take over-the-counter Pepcid for heartburn symptoms but try to avoid the foods that cause it to begin with.  If the symptoms persist over the next 4 to 6 weeks come back and see me and we will look into other treatments.   Return to the clinic or go to the nearest emergency room if any of your symptoms worsen or new symptoms occur.   Palpitations A palpitation is the feeling that your heartbeat is irregular or is faster than normal. It may feel like your heart is fluttering or skipping a beat. Palpitations are usually not a serious problem. They may be caused by many things, including smoking, caffeine, alcohol, stress, and certain medicines. Although most causes of palpitations are not serious, palpitations can be a sign of a serious medical problem. In some cases, you may need further medical evaluation. Follow these instructions at home: Pay attention to any changes in your symptoms. Take these actions to help with your condition:  Avoid the following: ? Caffeinated coffee, tea, soft drinks, diet pills, and energy drinks. ? Chocolate. ? Alcohol.  Do not use any tobacco products, such as cigarettes, chewing tobacco, and e-cigarettes. If you need help quitting, ask your health care provider.  Try to reduce your stress and anxiety. Things that can help you relax include: ? Yoga. ? Meditation. ? Physical activity, such as swimming, jogging, or walking. ? Biofeedback. This is a method that helps you learn to use your mind to control things in your body, such as your heartbeats.  Get plenty of rest and  sleep.  Take over-the-counter and prescription medicines only as told by your health care provider.  Keep all follow-up visits as told by your health care provider. This is important.  Contact a health care provider if:  You continue to have a fast or irregular heartbeat after 24 hours.  Your palpitations occur more often. Get help right away if:  You have chest pain or shortness of breath.  You have a severe headache.  You feel dizzy or you faint. This information is not intended to replace advice given to you by your health care provider. Make sure you discuss any questions you have with your health care provider. Document Released: 06/17/2000 Document Revised: 11/23/2015 Document Reviewed: 03/05/2015 Elsevier Interactive Patient Education  2018 [employer].   Food Choices for Gastroesophageal Reflux Disease, Adult When you have gastroesophageal reflux disease (GERD), the foods you eat and your eating habits are very important. Choosing the right foods can help ease your discomfort. What guidelines do I need to follow?  Choose fruits, vegetables, whole grains, and low-fat dairy products.  Choose low-fat meat, fish, and poultry.  Limit fats such as oils, salad dressings, butter, nuts, and avocado.  Keep a food diary. This helps you identify foods that cause symptoms.  Avoid foods that cause symptoms. These may be different for everyone.  Eat small meals often instead of 3 large meals a day.  Eat your meals slowly, in a place where you are relaxed.  Limit fried foods.  Cook foods using methods other than  frying.  Avoid drinking alcohol.  Avoid drinking large amounts of liquids with your meals.  Avoid bending over or lying down until 2-3 hours after eating. What foods are not recommended? These are some foods and drinks that may make your symptoms worse: Vegetables Tomatoes. Tomato juice. Tomato and spaghetti sauce. Chili peppers. Onion and garlic.  Horseradish. Fruits Oranges, grapefruit, and lemon (fruit and juice). Meats High-fat meats, fish, and poultry. This includes hot dogs, ribs, ham, sausage, salami, and bacon. Dairy Whole milk and chocolate milk. Sour cream. Cream. Butter. Ice cream. Cream cheese. Drinks Coffee and tea. Bubbly (carbonated) drinks or energy drinks. Condiments Hot sauce. Barbecue sauce. Sweets/Desserts Chocolate and cocoa. Donuts. Peppermint and spearmint. Fats and Oils High-fat foods. This includes [place] fries and potato chips. Other Vinegar. Strong spices. This includes black pepper, white pepper, red pepper, cayenne, curry powder, cloves, ginger, and chili powder. The items listed above may not be a complete list of foods and drinks to avoid. Contact your dietitian for more information. This information is not intended to replace advice given to you by your health care provider. Make sure you discuss any questions you have with your health care provider. Document Released: 12/20/2011 Document Revised: 11/26/2015 Document Reviewed: 04/24/2013 Elsevier Interactive Patient Education  2017 [employer].   How to Take Your Blood Pressure You can take your blood pressure at home with a machine. You may need to check your blood pressure at home:  To check if you have high blood pressure (hypertension).  To check your blood pressure over time.  To make sure your blood pressure medicine is working.  Supplies needed: You will need a blood pressure machine, or monitor. You can buy one at a drugstore or online. When choosing one:  Choose one with an arm cuff.  Choose one that wraps around your upper arm. Only one finger should fit between your arm and the cuff.  Do not choose one that measures your blood pressure from your wrist or finger.  Your doctor can suggest a monitor. How to prepare Avoid these things for 30 minutes before checking your blood pressure:  Drinking caffeine.  Drinking  alcohol.  Eating.  Smoking.  Exercising.  Five minutes before checking your blood pressure:  Pee.  Sit in a dining chair. Avoid sitting in a soft couch or armchair.  Be quiet. Do not talk.  How to take your blood pressure Follow the instructions that came with your machine. If you have a digital blood pressure monitor, these may be the instructions: 1. Sit up straight. 2. Place your feet on the floor. Do not cross your ankles or legs. 3. Rest your left arm at the level of your heart. You may rest it on a table, desk, or chair. 4. Pull up your shirt sleeve. 5. Wrap the blood pressure cuff around the upper part of your left arm. The cuff should be 1 inch (2.5 cm) above your elbow. It is best to wrap the cuff around bare skin. 6. Fit the cuff snugly around your arm. You should be able to place only one finger between the cuff and your arm. 7. Put the cord inside the groove of your elbow. 8. Press the power button. 9. Sit quietly while the cuff fills with air and loses air. 10. Write down the numbers on the screen. 11. Wait 2-3 minutes and then repeat steps 1-10.  What do the numbers mean? Two numbers make up your blood pressure. The first number is called  systolic pressure. The second is called diastolic pressure. An example of a blood pressure reading is "120 over 80" (or 120/80). If you are an adult and do not have a medical condition, use this guide to find out if your blood pressure is normal: Normal  First number: below 120.  Second number: below 80. Elevated  First number: 120-129.  Second number: below 80. Hypertension stage 1  First number: 130-139.  Second number: 80-89. Hypertension stage 2  First number: 140 or above.  Second number: 90 or above. Your blood pressure is above normal even if only the top or bottom number is above normal. Follow these instructions at home:  Check your blood pressure as often as your doctor tells you to.  Take your  monitor to your next doctor's appointment. Your doctor will: ? Make sure you are using it correctly. ? Make sure it is working right.  Make sure you understand what your blood pressure numbers should be.  Tell your doctor if your medicines are causing side effects. Contact a doctor if:  Your blood pressure keeps being high. Get help right away if:  Your first blood pressure number is higher than 180.  Your second blood pressure number is higher than 120. This information is not intended to replace advice given to you by your health care provider. Make sure you discuss any questions you have with your health care provider. Document Released: 06/02/2008 Document Revised: 05/18/2016 Document Reviewed: 11/27/2015 Elsevier Interactive Patient Education  [employer]2018 Elsevier Inc.    If you have lab work done today you will be contacted with your lab results within the next 2 weeks.  If you have not heard from us then please contact us. The fastest way to get your results is to register for My Chart.   IF you received an x-ray today, you will receive an invoice from [facility]. Please contact [facility] at [phone] with questions or concerns regarding your invoice.   IF you received labwork today, you will receive an invoice from [facility]. Please contact [facility] at [phone] with questions or concerns regarding your invoice.   Our billing staff will not be able to assist you with questions regarding bills from these companies.  You will be contacted with the lab results as soon as they are available. The fastest way to get your results is to activate your My Chart account. Instructions are located on the last page of this paperwork. If you have not heard from us regarding the results in 2 weeks, please contact this office.

## 2018-06-05 NOTE — Progress Notes (Signed)
Subjective:    Patient ID: Kelly Mcdowell, female    DOB: 10-11-1940, 77 y.o.   MRN: [id number]  HPI Kelly Mcdowell is a 77 y.o. female Presents today for: Chief Complaint  Patient presents with  . Medication Refill    synthroid   Hypothyroidism: Lab Results  Component Value Date   TSH 2.120 12/01/2017  Currently on Synthroid 88 mcg daily.  Had palpitations on higher dosing.  Stable symptoms last visit.   Elevated BP: Slightly elevated today, normal last 2 visits. No new chest pains, rare palpitation - about once per month - few minutes. No associated symptoms.   BP Readings from Last 3 Encounters:  06/05/18 (!) 144/74  12/01/17 134/66  05/30/17 132/62   Acid reflux: Notes occasional belching and acid reflux symptoms. Comes and goes.  No nt sweats, wt loss, dysphagia, n/v, fevers, or blood in stool/dark stools.  Some sinus drainage at times. Rare heartburn. Tx: occasional rolaids or tums.    Patient Active Problem List   Diagnosis Date Noted  . Hypothyroidism (acquired) 06/18/2012   Past Medical History:  Diagnosis Date  . Allergy   . Arthritis   . Cataract    both eyes  . 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 Thyroid disease    Past Surgical History:  Procedure Laterality Date  . CESAREAN SECTION    . TUBAL LIGATION     No Known Allergies Prior to Admission medications   Medication Sig Start Date End Date Taking? Authorizing Provider  Calcium Carb-Cholecalciferol (CALCIUM-VITAMIN D) 500-200 MG-UNIT tablet Take 1 tablet by mouth 1 day or 1 dose. 2 tablets once daily   Yes [provider]  GENERIC EXTERNAL MEDICATION 1 tablet daily as needed. As needed for diarrhea/OTC antidiarrheal medication   Yes [provider]  hydrOXYzine (ATARAX/VISTARIL) 25 MG tablet Take 1/2 tablet to one whole tablet by mouth daily as needed for itching 05/30/17  Yes [name], MD  ibuprofen (ADVIL,MOTRIN) 400 MG tablet Take 400 mg every 6  (six) hours as needed by mouth.   Yes [provider]  levothyroxine (SYNTHROID, LEVOTHROID) 88 MCG tablet Take 1 tablet (88 mcg total) by mouth daily before breakfast. 12/01/17  Yes [name], MD  Multiple Vitamins-Minerals (MULTIVITAMIN WITH MINERALS) tablet Take 1 tablet by mouth daily.   Yes [provider]   Social History   Socioeconomic History  . Marital status: Married    Spouse name: Not on file  . Number of children: Not on file  . Years of education: Not on file  . Highest education level: Not on file  Occupational History  . Not on file  Social Needs  . Financial resource strain: Not on file  . Food insecurity:    Worry: Not on file    Inability: Not on file  . Transportation needs:    Medical: Not on file    Non-medical: Not on file  Tobacco Use  . Smoking status: Former Smoker    Last attempt to quit: 03/04/2005    Years since quitting: 13.2  . Smokeless tobacco: Never Used  Substance and Sexual Activity  . Alcohol use: No  . Drug use: No  . Sexual activity: Never    Birth control/protection: Surgical  Lifestyle  . Physical activity:    Days per week: Not on file    Minutes per session: Not on file  . Stress: Not on file  Relationships  .  Social connections:    Talks on phone: Not on file    Gets together: Not on file    Attends religious service: Not on file    Active member of club or organization: Not on file    Attends meetings of clubs or organizations: Not on file    Relationship status: Not on file  . Intimate partner violence:    Fear of current or ex partner: Not on file    Emotionally abused: Not on file    Physically abused: Not on file    Forced sexual activity: Not on file  Other Topics Concern  . Not on file  Social History Narrative  . Not on file    Review of Systems  Constitutional: Negative for fatigue and unexpected weight change.  Respiratory: Negative for chest tightness and shortness of breath.     Cardiovascular: Positive for palpitations (rare. ). Negative for chest pain and leg swelling.  Gastrointestinal: Negative for abdominal pain and blood in stool.  Neurological: Negative for dizziness, syncope, light-headedness and headaches.       Objective:   Physical Exam  Constitutional: She is oriented to person, place, and time. She appears well-developed and well-nourished.  HENT:  Head: Normocephalic and atraumatic.  Eyes: Pupils are equal, round, and reactive to light. Conjunctivae and EOM are normal.  Neck: Carotid bruit is not present.  Cardiovascular: Normal rate, regular rhythm, normal heart sounds and intact distal pulses.  Pulmonary/Chest: Effort normal and breath sounds normal.  Abdominal: Soft. She exhibits no pulsatile midline mass. There is no tenderness.  Neurological: She is alert and oriented to person, place, and time.  Skin: Skin is warm and dry.  Psychiatric: She has a normal mood and affect. Her behavior is normal.  Vitals reviewed.  Vitals:   06/05/18 1055 06/05/18 1059  BP: (!) 161/68 (!) 144/74  Pulse: 66   Temp: 97.8 F (36.6 C)   TempSrc: Oral   SpO2: 97%   Weight: 120 lb 12.8 oz (54.8 kg)   Height: 5' 1.5" (1.562 m)        Assessment & Plan:    Kelly Mcdowell is a 77 y.o. female Heartburn  -Intermittent symptoms, may be some component of air swallowing with drainage.  Handout on the list of foods to avoid, RTC precautions if persistent.  Over-the-counter Pepcid if needed short-term.  Hypothyroidism, unspecified type - Plan: TSH, levothyroxine (SYNTHROID, LEVOTHROID) 88 MCG tablet  -Denies any hair/skin changes, weight changes.  Rare palpitations.  Check TSH, continue same dose Synthroid  Palpitations - Plan: TSH  -Rare, sinus rhythm/reassuring exam in office.  No associated symptoms with palpitations.  Check TSH, but if symptoms persist would recommend cardiology eval as discussed previously.  RTC/ER precautions  Elevated blood pressure  reading  -Previously normal.  Monitor outside of office and RTC precautions if elevated.  Handout given.  Meds ordered this encounter  Medications  . levothyroxine (SYNTHROID, LEVOTHROID) 88 MCG tablet    Sig: Take 1 tablet (88 mcg total) by mouth daily before breakfast.    Dispense:  90 tablet    Refill:  2   Patient Instructions    Blood pressure slightly elevated in the office today.  Previous visits it had been normal.  You can check that outside of the office with instructions below.  If you notice readings above 140/90, let me know. I will check thyroid test, but if heart palpitations return, I would like you to meet with cardiology.  For heartburn and belching - see foods to avoid below.  If needed you can take over-the-counter Pepcid for heartburn symptoms but try to avoid the foods that cause it to begin with.  If the symptoms persist over the next 4 to 6 weeks come back and see me and we will look into other treatments.   Return to the clinic or go to the nearest emergency room if any of your symptoms worsen or new symptoms occur.   Palpitations A palpitation is the feeling that your heartbeat is irregular or is faster than normal. It may feel like your heart is fluttering or skipping a beat. Palpitations are usually not a serious problem. They may be caused by many things, including smoking, caffeine, alcohol, stress, and certain medicines. Although most causes of palpitations are not serious, palpitations can be a sign of a serious medical problem. In some cases, you may need further medical evaluation. Follow these instructions at home: Pay attention to any changes in your symptoms. Take these actions to help with your condition:  Avoid the following: ? Caffeinated coffee, tea, soft drinks, diet pills, and energy drinks. ? Chocolate. ? Alcohol.  Do not use any tobacco products, such as cigarettes, chewing tobacco, and e-cigarettes. If you need help quitting, ask your  health care provider.  Try to reduce your stress and anxiety. Things that can help you relax include: ? Yoga. ? Meditation. ? Physical activity, such as swimming, jogging, or walking. ? Biofeedback. This is a method that helps you learn to use your mind to control things in your body, such as your heartbeats.  Get plenty of rest and sleep.  Take over-the-counter and prescription medicines only as told by your health care provider.  Keep all follow-up visits as told by your health care provider. This is important.  Contact a health care provider if:  You continue to have a fast or irregular heartbeat after 24 hours.  Your palpitations occur more often. Get help right away if:  You have chest pain or shortness of breath.  You have a severe headache.  You feel dizzy or you faint. This information is not intended to replace advice given to you by your health care provider. Make sure you discuss any questions you have with your health care provider. Document Released: 06/17/2000 Document Revised: 11/23/2015 Document Reviewed: 03/05/2015 Elsevier Interactive Patient Education  2018 [employer].   Food Choices for Gastroesophageal Reflux Disease, Adult When you have gastroesophageal reflux disease (GERD), the foods you eat and your eating habits are very important. Choosing the right foods can help ease your discomfort. What guidelines do I need to follow?  Choose fruits, vegetables, whole grains, and low-fat dairy products.  Choose low-fat meat, fish, and poultry.  Limit fats such as oils, salad dressings, butter, nuts, and avocado.  Keep a food diary. This helps you identify foods that cause symptoms.  Avoid foods that cause symptoms. These may be different for everyone.  Eat small meals often instead of 3 large meals a day.  Eat your meals slowly, in a place where you are relaxed.  Limit fried foods.  Cook foods using methods other than frying.  Avoid drinking  alcohol.  Avoid drinking large amounts of liquids with your meals.  Avoid bending over or lying down until 2-3 hours after eating. What foods are not recommended? These are some foods and drinks that may make your symptoms worse: Vegetables Tomatoes. Tomato juice. Tomato and spaghetti sauce. Chili peppers. Onion and  garlic. Horseradish. Fruits Oranges, grapefruit, and lemon (fruit and juice). Meats High-fat meats, fish, and poultry. This includes hot dogs, ribs, ham, sausage, salami, and bacon. Dairy Whole milk and chocolate milk. Sour cream. Cream. Butter. Ice cream. Cream cheese. Drinks Coffee and tea. Bubbly (carbonated) drinks or energy drinks. Condiments Hot sauce. Barbecue sauce. Sweets/Desserts Chocolate and cocoa. Donuts. Peppermint and spearmint. Fats and Oils High-fat foods. This includes [place] fries and potato chips. Other Vinegar. Strong spices. This includes black pepper, white pepper, red pepper, cayenne, curry powder, cloves, ginger, and chili powder. The items listed above may not be a complete list of foods and drinks to avoid. Contact your dietitian for more information. This information is not intended to replace advice given to you by your health care provider. Make sure you discuss any questions you have with your health care provider. Document Released: 12/20/2011 Document Revised: 11/26/2015 Document Reviewed: 04/24/2013 Elsevier Interactive Patient Education  2017 [employer].   How to Take Your Blood Pressure You can take your blood pressure at home with a machine. You may need to check your blood pressure at home:  To check if you have high blood pressure (hypertension).  To check your blood pressure over time.  To make sure your blood pressure medicine is working.  Supplies needed: You will need a blood pressure machine, or monitor. You can buy one at a drugstore or online. When choosing one:  Choose one with an arm cuff.  Choose one that  wraps around your upper arm. Only one finger should fit between your arm and the cuff.  Do not choose one that measures your blood pressure from your wrist or finger.  Your doctor can suggest a monitor. How to prepare Avoid these things for 30 minutes before checking your blood pressure:  Drinking caffeine.  Drinking alcohol.  Eating.  Smoking.  Exercising.  Five minutes before checking your blood pressure:  Pee.  Sit in a dining chair. Avoid sitting in a soft couch or armchair.  Be quiet. Do not talk.  How to take your blood pressure Follow the instructions that came with your machine. If you have a digital blood pressure monitor, these may be the instructions: 1. Sit up straight. 2. Place your feet on the floor. Do not cross your ankles or legs. 3. Rest your left arm at the level of your heart. You may rest it on a table, desk, or chair. 4. Pull up your shirt sleeve. 5. Wrap the blood pressure cuff around the upper part of your left arm. The cuff should be 1 inch (2.5 cm) above your elbow. It is best to wrap the cuff around bare skin. 6. Fit the cuff snugly around your arm. You should be able to place only one finger between the cuff and your arm. 7. Put the cord inside the groove of your elbow. 8. Press the power button. 9. Sit quietly while the cuff fills with air and loses air. 10. Write down the numbers on the screen. 11. Wait 2-3 minutes and then repeat steps 1-10.  What do the numbers mean? Two numbers make up your blood pressure. The first number is called systolic pressure. The second is called diastolic pressure. An example of a blood pressure reading is "120 over 80" (or 120/80). If you are an adult and do not have a medical condition, use this guide to find out if your blood pressure is normal: Normal  First number: below 120.  Second number: below 80. Elevated  First number: 120-129.  Second number: below 80. Hypertension stage 1  First number:  130-139.  Second number: 80-89. Hypertension stage 2  First number: 140 or above.  Second number: 90 or above. Your blood pressure is above normal even if only the top or bottom number is above normal. Follow these instructions at home:  Check your blood pressure as often as your doctor tells you to.  Take your monitor to your next doctor's appointment. Your doctor will: ? Make sure you are using it correctly. ? Make sure it is working right.  Make sure you understand what your blood pressure numbers should be.  Tell your doctor if your medicines are causing side effects. Contact a doctor if:  Your blood pressure keeps being high. Get help right away if:  Your first blood pressure number is higher than 180.  Your second blood pressure number is higher than 120. This information is not intended to replace advice given to you by your health care provider. Make sure you discuss any questions you have with your health care provider. Document Released: 06/02/2008 Document Revised: 05/18/2016 Document Reviewed: 11/27/2015 Elsevier Interactive Patient Education  [employer].    If you have lab work done today you will be contacted with your lab results within the next 2 weeks.  If you have not heard from [place] then please contact us. The fastest way to get your results is to register for My Chart.   IF you received an x-ray today, you will receive an invoice from [facility]. Please contact [facility] at [phone] with questions or concerns regarding your invoice.   IF you received labwork today, you will receive an invoice from [facility]. Please contact [facility] at [phone] with questions or concerns regarding your invoice.   Our billing staff will not be able to assist you with questions regarding bills from these companies.  You will be contacted with the lab results as soon as they are available. The fastest way to get your results is to activate  your My Chart account. Instructions are located on the last page of this paperwork. If you have not heard from [place] regarding the results in 2 weeks, please contact this office.       Signed,   Meredith Staggers, MD [facility].  06/05/18 11:42 AM

## 2018-06-06 LAB — TSH: TSH: 2.47 u[IU]/mL (ref 0.450–4.500)

## 2018-12-12 ENCOUNTER — Ambulatory Visit: Payer: Medicare Other | Admitting: Family Medicine

## 2018-12-19 ENCOUNTER — Encounter: Payer: Self-pay | Admitting: Family Medicine

## 2018-12-19 ENCOUNTER — Ambulatory Visit (INDEPENDENT_AMBULATORY_CARE_PROVIDER_SITE_OTHER): Payer: Medicare Other | Admitting: Family Medicine

## 2018-12-19 ENCOUNTER — Other Ambulatory Visit: Payer: Self-pay

## 2018-12-19 VITALS — BP 140/76 | HR 67 | Temp 98.3°F | Resp 12 | Wt 120.8 lb

## 2018-12-19 DIAGNOSIS — M40204 Unspecified kyphosis, thoracic region: Secondary | ICD-10-CM

## 2018-12-19 DIAGNOSIS — M545 Low back pain, unspecified: Secondary | ICD-10-CM

## 2018-12-19 DIAGNOSIS — E039 Hypothyroidism, unspecified: Secondary | ICD-10-CM | POA: Diagnosis not present

## 2018-12-19 DIAGNOSIS — M25512 Pain in left shoulder: Secondary | ICD-10-CM | POA: Diagnosis not present

## 2018-12-19 DIAGNOSIS — M810 Age-related osteoporosis without current pathological fracture: Secondary | ICD-10-CM

## 2018-12-19 NOTE — Patient Instructions (Addendum)
I will check an x-ray of your shoulder, but for now okay to use Tylenol over-the-counter, heat or ice and gentle range of motion.  Avoid any activities that seem to make that shoulder pain worse for now and recheck in 2 weeks.  I will check your thyroid test levels, but no change in medications for now.  Osteoporosis was noted on bone density test in 2018, but not sure what happened with follow-up with endocrinology.  Based on the appearance of your back today, I am concerned for possible kyphosis or change in appearance of the spine, and can sometimes be seen with compression fractures or spine issues.  I will obtain some x-rays but also refer you to endocrinology for discussion of osteoporosis treatments.  Return to the clinic or go to the nearest emergency room if any of your symptoms worsen or new symptoms occur.   Shoulder Pain Many things can cause shoulder pain, including:  An injury to the shoulder.  Overuse of the shoulder.  Arthritis. The source of the pain can be:  Inflammation.  An injury to the shoulder joint.  An injury to a tendon, ligament, or bone. Follow these instructions at home: Pay attention to changes in your symptoms. Let your health care provider know about them. Follow these instructions to relieve your pain. If you have a sling:  Wear the sling as told by your health care provider. Remove it only as told by your health care provider.  Loosen the sling if your fingers tingle, become numb, or turn cold and blue.  Keep the sling clean.  If the sling is not waterproof: ? Do not let it get wet. Remove it to shower or bathe.  Move your arm as little as possible, but keep your hand moving to prevent swelling. Managing pain, stiffness, and swelling   If directed, put ice on the painful area: ? Put ice in a plastic bag. ? Place a towel between your skin and the bag. ? Leave the ice on for 20 minutes, 2-3 times per day. Stop applying ice if it does not  help with the pain.  Squeeze a soft ball or a foam pad as much as possible. This helps to keep the shoulder from swelling. It also helps to strengthen the arm. General instructions  Take over-the-counter and prescription medicines only as told by your health care provider.  Keep all follow-up visits as told by your health care provider. This is important. Contact a health care provider if:  Your pain gets worse.  Your pain is not relieved with medicines.  New pain develops in your arm, hand, or fingers. Get help right away if:  Your arm, hand, or fingers: ? Tingle. ? Become numb. ? Become swollen. ? Become painful. ? Turn white or blue. Summary  Shoulder pain can be caused by an injury, overuse, or arthritis.  Pay attention to changes in your symptoms. Let your health care provider know about them.  This condition may be treated with a sling, ice, and pain medicines.  Contact your health care provider if the pain gets worse or new pain develops. Get help right away if your arm, hand, or fingers tingle or become numb, swollen, or painful.  Keep all follow-up visits as told by your health care provider. This is important. This information is not intended to replace advice given to you by your health care provider. Make sure you discuss any questions you have with your health care provider. Document Released: 03/30/2005  Document Revised: 01/02/2018 Document Reviewed: 01/02/2018 Elsevier Interactive Patient Education  [employer]2019 Else[employer] Inc.   If you have lab work done today you will be contacted with your lab results within the next 2 weeks.  If you have not heard from us then please contact us. The fastest way to get your results is to register for My Chart.   IF you received an x-ray today, you will receive an invoice from [facility]. Please contact [facility] at [phone] with questions or concerns regarding your invoice.   IF you received labwork today,  you will receive an invoice from [facility]. Please contact [facility] at [phone] with questions or concerns regarding your invoice.   Our billing staff will not be able to assist you with questions regarding bills from these companies.  You will be contacted with the lab results as soon as they are available. The fastest way to get your results is to activate your My Chart account. Instructions are located on the last page of this paperwork. If you have not heard from us regarding the results in 2 weeks, please contact this office.

## 2018-12-19 NOTE — Progress Notes (Signed)
Subjective:    Patient ID: Kelly Mcdowell Cobern, female    DOB: Sep 15, 1940, 78 y.o.   MRN: [id number]  HPI Kelly Mcdowell Anstey is a 78 y.o. female Presents today for: Chief Complaint  Patient presents with  . Hypothyroidism     6 month f/u on thyroid  . Shoulder Pain    Patient stated she have been having left shoulder pain for the past 2-3 days. Hx of getting cortisone shots in left shlouder before for pain   Hypothyroidism: Lab Results  Component Value Date   TSH 2.470 06/05/2018  Continued on Synthroid 88 mcg as of 06/15/2018 visit. Elevated blood pressure readings noted at that time.  Have discussed intermittent palpitations and monitoring home readings with RTC precautions. Palpitations resolved.  Home blood pressures ok when checked at home. - unknown specific readings.  No new temp intolerance, no weight/hair/skin changes.  Less heartburn, no daily meds. Rare episodes.   L shoulder pain: Noticed in past week.  NKI,  Noticed acheing after picking up something? Pain up and down - sometimes worse. No neck pain. No arm radiation. Some limitation with elevation and backwards- sore. Comes and goes.  Tx: ibuprofen or tylenol - only at times.   L hand dominant.  Thinks had injection in L shoulder 20 years ago.   Osteoporosis: Prior back pain, not hurting now.  Walking hunched over/forward for years. Uses walker at times.   No known hx fragility fx. degenerative changes in lower lumbar spine noted in 2014 Hip XR.   Bone density in 2018 -3.0 at lumbar spine,-2.3 left fermoal neck. Plan for follow up to discuss treatment -  tx options discussec in 05/2017- plan to meet with endocrinology.   Has been trying to stay at home as much as possible.    Patient Active Problem List   Diagnosis Date Noted  . Hypothyroidism (acquired) 06/18/2012   Past Medical History:  Diagnosis Date  . Allergy   . Arthritis   . Cataract    both eyes  . 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 Thyroid disease    Past Surgical History:  Procedure Laterality Date  . CESAREAN SECTION    . TUBAL LIGATION     No Known Allergies Prior to Admission medications   Medication Sig Start Date End Date Taking? Authorizing Provider  Calcium Carb-Cholecalciferol (CALCIUM-VITAMIN D) 500-200 MG-UNIT tablet Take 1 tablet by mouth 1 day or 1 dose. 2 tablets once daily   Yes [provider]  GENERIC EXTERNAL MEDICATION 1 tablet daily as needed. As needed for diarrhea/OTC antidiarrheal medication   Yes [provider]  hydrOXYzine (ATARAX/VISTARIL) 25 MG tablet Take 1/2 tablet to one whole tablet by mouth daily as needed for itching 05/30/17  Yes Shade FloodGreene, Lanasia Porras R, MD  ibuprofen (ADVIL,MOTRIN) 400 MG tablet Take 400 mg every 6 (six) hours as needed by mouth.   Yes [provider]  levothyroxine (SYNTHROID, LEVOTHROID) 88 MCG tablet Take 1 tablet (88 mcg total) by mouth daily before breakfast. 06/05/18  Yes Shade FloodGreene, Jacora Hopkins R, MD  Multiple Vitamins-Minerals (MULTIVITAMIN WITH MINERALS) tablet Take 1 tablet by mouth daily.   Yes [provider]   Social History   Socioeconomic History  . Marital status: Married    Spouse name: Not on file  . Number of children: Not on file  . Years of education: Not on file  . Highest education level: Not on file  Occupational History  . Not  on file  Social Needs  . Financial resource strain: Not on file  . Food insecurity    Worry: Not on file    Inability: Not on file  . Transportation needs    Medical: Not on file    Non-medical: Not on file  Tobacco Use  . Smoking status: Former Smoker    Quit date: 03/04/2005    Years since quitting: 13.8  . Smokeless tobacco: Never Used  Substance and Sexual Activity  . Alcohol use: No  . Drug use: No  . Sexual activity: Never    Birth control/protection: Surgical  Lifestyle  . Physical activity    Days per week: Not on file    Minutes per session: Not on file   . Stress: Not on file  Relationships  . Social Musicianconnections    Talks on phone: Not on file    Gets together: Not on file    Attends religious service: Not on file    Active member of club or organization: Not on file    Attends meetings of clubs or organizations: Not on file    Relationship status: Not on file  . Intimate partner violence    Fear of current or ex partner: Not on file    Emotionally abused: Not on file    Physically abused: Not on file    Forced sexual activity: Not on file  Other Topics Concern  . Not on file  Social History Narrative  . Not on file    Review of Systems     Objective:   Physical Exam Vitals signs reviewed.  Constitutional:      Appearance: She is well-developed.  HENT:     Head: Normocephalic and atraumatic.  Eyes:     Conjunctiva/sclera: Conjunctivae normal.     Pupils: Pupils are equal, round, and reactive to light.  Neck:     Vascular: No carotid bruit.  Cardiovascular:     Rate and Rhythm: Normal rate and regular rhythm.     Heart sounds: Normal heart sounds.  Pulmonary:     Effort: Pulmonary effort is normal.     Breath sounds: Normal breath sounds.  Abdominal:     Palpations: Abdomen is soft. There is no pulsatile mass.     Tenderness: There is no abdominal tenderness.  Musculoskeletal:     Left shoulder: She exhibits decreased range of motion (Slight decreased abduction due to discomfort.  Slight discomfort with range of motion at extremes.  Internal rotation.  Equal to right side.  Minimal discomfort with Neer and Hawkins.  Full rotator cuff strength.  Neurovascular intact distally.). She exhibits no bony tenderness (Sternoclavicular, acromioclavicular joints and clavicle are nontender.).     Cervical back: She exhibits decreased range of motion (Minimal decrease of cervical range of motion, specifically rotation but does not reproduce shoulder symptoms.).     Thoracic back: She exhibits deformity (Appears to have predominantly  kyphosis in the mid thoracic spine.  No focal bony tenderness of lumbar or thoracic midline spine.).  Skin:    General: Skin is warm and dry.  Neurological:     Mental Status: She is alert and oriented to person, place, and time.  Psychiatric:        Behavior: Behavior normal.    Vitals:   12/19/18 1437  BP: 140/76  Pulse: 67  Resp: 12  Temp: 98.3 F (36.8 Mcdowell)  TempSrc: Oral  SpO2: 97%  Weight: 120 lb 12.8 oz (54.8 kg)  Assessment & Plan:   Kelly Mcdowell Motter is a 78 y.o. female Hypothyroidism, unspecified type - Plan: TSH + free T4  -Check TSH, free T4, continue Synthroid same dosing for now.  Acute pain of left shoulder - Plan: DG Shoulder Left  -No known injury, but based on exam may have component of rotator cuff tendinitis versus impingement.  Initially will try symptomatic care with ice or heat, Tylenol, range of motion as tolerated, check shoulder x-ray, and recheck in the next few weeks to determine if injection versus orthopedic evaluation.  Kyphosis of thoracic region, unspecified kyphosis type - Plan: Ambulatory referral to Endocrinology, CANCELED: DG Thoracic Spine 2 View Episodic low back pain - Plan: DG Lumbar Spine 2-3 Views Osteoporosis, unspecified osteoporosis type, unspecified pathological fracture presence - Plan: DG Lumbar Spine 2-3 Views, Ambulatory referral to Endocrinology, CANCELED: DG Thoracic Spine 2 View  -kyphotic appearing spine.  Osteoporosis previously noted but has not had evaluation with endocrinology or treatment.  Will check lumbar and thoracic x-rays given prior intermittent pain, evaluate for compression fractures.  Refer to endocrinology again to discuss treatment options.  No orders of the defined types were placed in this encounter.  Patient Instructions     I will check an x-ray of your shoulder, but for now okay to use Tylenol over-the-counter, heat or ice and gentle range of motion.  Avoid any activities that seem to make that  shoulder pain worse for now and recheck in 2 weeks.  I will check your thyroid test levels, but no change in medications for now.  Osteoporosis was noted on bone density test in 2018, but not sure what happened with follow-up with endocrinology.  Based on the appearance of your back today, I am concerned for possible kyphosis or change in appearance of the spine, and can sometimes be seen with compression fractures or spine issues.  I will obtain some x-rays but also refer you to endocrinology for discussion of osteoporosis treatments.  Return to the clinic or go to the nearest emergency room if any of your symptoms worsen or new symptoms occur.   Shoulder Pain Many things can cause shoulder pain, including:  An injury to the shoulder.  Overuse of the shoulder.  Arthritis. The source of the pain can be:  Inflammation.  An injury to the shoulder joint.  An injury to a tendon, ligament, or bone. Follow these instructions at home: Pay attention to changes in your symptoms. Let your health care provider know about them. Follow these instructions to relieve your pain. If you have a sling:  Wear the sling as told by your health care provider. Remove it only as told by your health care provider.  Loosen the sling if your fingers tingle, become numb, or turn cold and blue.  Keep the sling clean.  If the sling is not waterproof: ? Do not let it get wet. Remove it to shower or bathe.  Move your arm as little as possible, but keep your hand moving to prevent swelling. Managing pain, stiffness, and swelling   If directed, put ice on the painful area: ? Put ice in a plastic bag. ? Place a towel between your skin and the bag. ? Leave the ice on for 20 minutes, 2-3 times per day. Stop applying ice if it does not help with the pain.  Squeeze a soft ball or a foam pad as much as possible. This helps to keep the shoulder from swelling. It also helps to strengthen the  arm. General  instructions  Take over-the-counter and prescription medicines only as told by your health care provider.  Keep all follow-up visits as told by your health care provider. This is important. Contact a health care provider if:  Your pain gets worse.  Your pain is not relieved with medicines.  New pain develops in your arm, hand, or fingers. Get help right away if:  Your arm, hand, or fingers: ? Tingle. ? Become numb. ? Become swollen. ? Become painful. ? Turn white or blue. Summary  Shoulder pain can be caused by an injury, overuse, or arthritis.  Pay attention to changes in your symptoms. Let your health care provider know about them.  This condition may be treated with a sling, ice, and pain medicines.  Contact your health care provider if the pain gets worse or new pain develops. Get help right away if your arm, hand, or fingers tingle or become numb, swollen, or painful.  Keep all follow-up visits as told by your health care provider. This is important. This information is not intended to replace advice given to you by your health care provider. Make sure you discuss any questions you have with your health care provider. Document Released: 03/30/2005 Document Revised: 01/02/2018 Document Reviewed: 01/02/2018 Elsevier Interactive Patient Education  [employer].   If you have lab work done today you will be contacted with your lab results within the next 2 weeks.  If you have not heard from [place] then please contact us. The fastest way to get your results is to register for My Chart.   IF you received an x-ray today, you will receive an invoice from [facility]. Please contact [facility] at [phone] with questions or concerns regarding your invoice.   IF you received labwork today, you will receive an invoice from [facility]. Please contact [facility] at [phone] with questions or concerns regarding your invoice.   Our billing staff will not be  able to assist you with questions regarding bills from these companies.  You will be contacted with the lab results as soon as they are available. The fastest way to get your results is to activate your My Chart account. Instructions are located on the last page of this paperwork. If you have not heard from [place] regarding the results in 2 weeks, please contact this office.       Signed,   Merri Ray, MD [facility] [place].  12/20/18 12:39 PM

## 2018-12-20 ENCOUNTER — Ambulatory Visit
Admission: RE | Admit: 2018-12-20 | Discharge: 2018-12-20 | Disposition: A | Discharge status: Home / Self Care | Payer: Medicare Other | Source: Ambulatory Visit | Attending: Family Medicine | Admitting: Family Medicine

## 2018-12-20 ENCOUNTER — Other Ambulatory Visit: Payer: Self-pay | Admitting: Family Medicine

## 2018-12-20 DIAGNOSIS — M19012 Primary osteoarthritis, left shoulder: Secondary | ICD-10-CM | POA: Diagnosis not present

## 2018-12-20 DIAGNOSIS — M25512 Pain in left shoulder: Secondary | ICD-10-CM

## 2018-12-20 DIAGNOSIS — M40204 Unspecified kyphosis, thoracic region: Secondary | ICD-10-CM

## 2018-12-20 DIAGNOSIS — M810 Age-related osteoporosis without current pathological fracture: Secondary | ICD-10-CM

## 2018-12-20 DIAGNOSIS — M4184 Other forms of scoliosis, thoracic region: Secondary | ICD-10-CM | POA: Diagnosis not present

## 2018-12-20 DIAGNOSIS — M545 Low back pain, unspecified: Secondary | ICD-10-CM

## 2018-12-20 DIAGNOSIS — M47814 Spondylosis without myelopathy or radiculopathy, thoracic region: Secondary | ICD-10-CM | POA: Diagnosis not present

## 2018-12-20 DIAGNOSIS — M5137 Other intervertebral disc degeneration, lumbosacral region: Secondary | ICD-10-CM | POA: Diagnosis not present

## 2018-12-20 LAB — TSH+FREE T4
Free T4: 1.43 ng/dL (ref 0.82–1.77)
TSH: 3.25 u[IU]/mL (ref 0.450–4.500)

## 2019-01-02 ENCOUNTER — Ambulatory Visit: Payer: Medicare Other | Admitting: Family Medicine

## 2019-01-03 ENCOUNTER — Telehealth: Payer: Self-pay | Admitting: Family Medicine

## 2019-01-03 NOTE — Telephone Encounter (Signed)
01/03/2019 - PATIENT HAD AN OFFICE VISIT TO SEE DR. GREENE ON WED. (01/02/2019). THE OFFICE WAS CLOSED DUE TO PREVENTATIVE DEEP CLEANING. I TRIED TO CALL AND RESCHEDULE BUT HAD TO LEAVE A MESSAGE ON HER VOICE MAIL TO RETURN OUR CALL. [place]

## 2019-02-12 ENCOUNTER — Encounter: Payer: Self-pay | Admitting: Internal Medicine

## 2019-02-28 ENCOUNTER — Other Ambulatory Visit: Payer: Self-pay

## 2019-03-01 ENCOUNTER — Encounter: Payer: Self-pay | Admitting: Internal Medicine

## 2019-03-01 ENCOUNTER — Ambulatory Visit (INDEPENDENT_AMBULATORY_CARE_PROVIDER_SITE_OTHER): Payer: Medicare Other | Admitting: Internal Medicine

## 2019-03-01 VITALS — BP 142/78 | HR 72 | Temp 98.2°F | Ht 62.0 in | Wt 120.2 lb

## 2019-03-01 DIAGNOSIS — M810 Age-related osteoporosis without current pathological fracture: Secondary | ICD-10-CM | POA: Diagnosis not present

## 2019-03-01 LAB — COMPREHENSIVE METABOLIC PANEL
ALT: 12 U/L (ref 0–35)
AST: 21 U/L (ref 0–37)
Albumin: 4.4 g/dL (ref 3.5–5.2)
Alkaline Phosphatase: 96 U/L (ref 39–117)
BUN: 17 mg/dL (ref 6–23)
CO2: 25 mEq/L (ref 19–32)
Calcium: 9.7 mg/dL (ref 8.4–10.5)
Chloride: 106 mEq/L (ref 96–112)
Creatinine, Ser: 0.88 mg/dL (ref 0.40–1.20)
GFR: 62.5 mL/min (ref >60.00)
Glucose, Bld: 89 mg/dL (ref 70–99)
Potassium: 3.7 mEq/L (ref 3.5–5.1)
Sodium: 143 mEq/L (ref 135–145)
Total Bilirubin: 0.6 mg/dL (ref 0.2–1.2)
Total Protein: 7.5 g/dL (ref 6.0–8.3)

## 2019-03-01 LAB — VITAMIN D 25 HYDROXY (VIT D DEFICIENCY, FRACTURES): VITD: 31.64 ng/mL (ref 30.00–100.00)

## 2019-03-01 NOTE — Patient Instructions (Signed)
-   Please start Calcium Carbonate 500/or 600 mg, 1 tablet with Lunch and Supper  - Start Vitamin D3 1000 iu daily

## 2019-03-01 NOTE — Progress Notes (Signed)
Name: Kelly Mcdowell  MRN/ DOB: [id number], October 11, 1940    Age/ Sex: 78 y.o., female    PCP: [name], MD   Reason for Endocrinology Evaluation: Osteoporosis      Date of Initial Endocrinology Evaluation: 03/04/2019     HPI: Ms. Kelly Mcdowell is a 78 y.o. female with a past medical history of Osteoporosis, hypothyroidism and scoliosis/Kyphosis. The patient presented for initial endocrinology clinic visit on 03/04/2019 for consultative assistance with her Osteoporosis .   Pt was diagnosed with osteoporosis:2018  Menarche at age : 59 Menopausal at age : 57's Fracture Hx: no  Hx of HRT: Yes- few years  FH of osteoporosis or hip fracture: no Prior Hx of anti-estrogenic therapy :no Prior Hx of anti-resorptive therapy : no  No calcium and vitamin D   Has periodic heartburn, relieved by OTC antacid meds   No dental issues , last exam was last year.     HISTORY:  Past Medical History:  Past Medical History:  Diagnosis Date  . Allergy   . Arthritis   . Cataract    both eyes  . 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 Thyroid disease    Past Surgical History:  Past Surgical History:  Procedure Laterality Date  . CESAREAN SECTION    . TUBAL LIGATION        Social History:  reports that she quit smoking about 14 years ago. She has never used smokeless tobacco. She reports that she does not drink alcohol or use drugs.  Family History: family history includes Breast cancer in her maternal aunt and sister; Cancer in her maternal grandmother and sister; Colon polyps in her sister; Congestive Heart Failure in her father; Stomach cancer in her maternal aunt.   HOME MEDICATIONS: Allergies as of 03/01/2019   No Known Allergies     Medication List       Accurate as of March 01, 2019 11:59 PM. If you have any questions, ask your nurse or doctor.        alendronate 70 MG tablet Commonly known as: FOSAMAX Take 1 tablet (70 mg total) by mouth  once a week. Take with a full glass of water on an empty stomach. Started by: Scarlette Shorts, MD   calcium-vitamin D 500-200 MG-UNIT tablet Take 1 tablet by mouth 1 day or 1 dose. 2 tablets once daily   GENERIC EXTERNAL MEDICATION 1 tablet daily as needed. As needed for diarrhea/OTC antidiarrheal medication   hydrOXYzine 25 MG tablet Commonly known as: ATARAX/VISTARIL Take 1/2 tablet to one whole tablet by mouth daily as needed for itching   ibuprofen 400 MG tablet Commonly known as: ADVIL Take 400 mg every 6 (six) hours as needed by mouth.   levothyroxine 88 MCG tablet Commonly known as: SYNTHROID Take 1 tablet (88 mcg total) by mouth daily before breakfast.   multivitamin with minerals tablet Take 1 tablet by mouth daily.         REVIEW OF SYSTEMS: A comprehensive ROS was conducted with the patient and is negative except as per HPI and below:  Review of Systems  HENT: Negative for congestion.   Respiratory: Negative for cough, shortness of breath and stridor.   Cardiovascular: Negative for chest pain and palpitations.  Gastrointestinal: Positive for heartburn. Negative for diarrhea.  Musculoskeletal: Positive for back pain and joint pain.  Neurological: Negative for tingling and tremors.      OBJECTIVE:  VS: BP Marland Kitchen)  142/78 (BP Location: Left Arm, Patient Position: Sitting, Cuff Size: Normal)   Pulse 72   Temp 98.2 F (36.8 C)   Ht 5\' 2"  (1.575 m)   Wt 120 lb 3.2 oz (54.5 kg)   SpO2 98%   BMI 21.98 kg/m    Wt Readings from Last 3 Encounters:  03/01/19 120 lb 3.2 oz (54.5 kg)  12/19/18 120 lb 12.8 oz (54.8 kg)  06/05/18 120 lb 12.8 oz (54.8 kg)     EXAM: General: Pt in NAD, with kyphosis  Hydration: Well-hydrated with moist mucous membranes and good skin turgor  Eyes: External eye exam normal without stare, lid lag or exophthalmos.  EOM intact.    Ears, Nose, Throat: Hearing: Grossly intact bilaterally Dental: Good dentition  Throat: Clear without  mass, erythema or exudate  Neck: General: Supple without adenopathy. Thyroid: Thyroid size normal.  No goiter or nodules appreciated. No thyroid bruit.  Lungs: Clear with good BS bilat with no rales, rhonchi, or wheezes  Heart: Auscultation: RRR.  Abdomen: Normoactive bowel sounds, soft, nontender, without masses or organomegaly palpable  Extremities:  BL LE: No pretibial edema normal ROM and strength.  Skin: Hair: Texture and amount normal with gender appropriate distribution Skin Inspection: No rashes Skin Palpation: Skin temperature, texture, and thickness normal to palpation  Neuro: Cranial nerves: II - XII grossly intact  DTRs: 2+ and symmetric in UE without delay in relaxation phase  Mental Status: Judgment, insight: Intact Orientation: Oriented to time, place, and person Mood and affect: No depression, anxiety, or agitation     DATA REVIEWED: Results for Kelly, Mcdowell" (MRN [id number]) as of 03/04/2019 10:13  Ref. Range 03/01/2019 11:18  Sodium Latest Ref Range: 135 - 145 mEq/L 143  Potassium Latest Ref Range: 3.5 - 5.1 mEq/L 3.7  Chloride Latest Ref Range: 96 - 112 mEq/L 106  CO2 Latest Ref Range: 19 - 32 mEq/L 25  Glucose Latest Ref Range: 70 - 99 mg/dL 89  BUN Latest Ref Range: 6 - 23 mg/dL 17  Creatinine Latest Ref Range: 0.40 - 1.20 mg/dL 0.88  Calcium Latest Ref Range: 8.4 - 10.5 mg/dL 9.7  Alkaline Phosphatase Latest Ref Range: 39 - 117 U/L 96  Albumin Latest Ref Range: 3.5 - 5.2 g/dL 4.4  AST Latest Ref Range: 0 - 37 U/L 21  ALT Latest Ref Range: 0 - 35 U/L 12  Total Protein Latest Ref Range: 6.0 - 8.3 g/dL 7.5  Total Bilirubin Latest Ref Range: 0.2 - 1.2 mg/dL 0.6  GFR Latest Ref Range: >60.00 mL/min 62.50  VITD Latest Ref Range: 30.00 - 100.00 ng/mL 31.64    Results for Kelly, Mcdowell" (MRN [id number]) as of 03/01/2019 07:31  Ref. Range 12/19/2018 15:51  TSH Latest Ref Range: 0.450 - 4.500 uIU/mL 3.250  T4,Free(Direct) Latest Ref Range: 0.82 -  1.77 ng/dL 1.43     DXA 04/19/2017  Site Region Measured Date Measured Age YA BMD Significant CHANGE T-score AP Spine  L1-L2     04/19/2017    73.9         -3.0    0.809 g/cm2  DualFemur Neck Left 04/19/2017    73.9         -2.3    0.713 g/cm2  ASSESSMENT/PLAN/RECOMMENDATIONS:   1. Osteoporosis :   - Discussed the importance of calcium and vitamin D intake for bone health - Discussed she is at a high risk for fractures - Will repeat DXA - Discussed  antiresorptive treatment  options, if her GFR is normal will start her on Alendronate. -  We discussed rare side effect of atypical fractures and osteonecrosis. Pt encouraged to have an updated dental exam - If unable to tolerate oral will consider IV infusion.  - If GFR < 35, will consider prolia  - Pt encouraged to schedule a dental exam this year   Medications : Calcium Carbonate 600 mg BID  Vitamin D 1000 iu daily  Alendronate 70 mg weekly    F/u in 6 months   Addendum: Discussed labs with pt on 8/31 @ 1015am  Signed electronically by: Lyndle HerrlichAbby Jaralla , MD  [facility]  [facility] [address] [place], [place] [address] Phone: [phone] FAX: [phone]   CC: Shade FloodGreene, Jeffrey R, MD [address] [place] [address] Phone: [phone] Fax: [phone]   Return to Endocrinology clinic as below: Future Appointments  Date Time Provider Department Center  08/30/2019 10:30 AM , Konrad DoloresIbtehal Jaralla, MD [facility] None

## 2019-03-04 DIAGNOSIS — M810 Age-related osteoporosis without current pathological fracture: Secondary | ICD-10-CM | POA: Insufficient documentation

## 2019-03-04 MED ORDER — ALENDRONATE SODIUM 70 MG PO TABS: 70.0000 mg | ORAL_TABLET | ORAL | 3 refills | Status: DC

## 2019-04-22 ENCOUNTER — Ambulatory Visit (INDEPENDENT_AMBULATORY_CARE_PROVIDER_SITE_OTHER)
Admission: RE | Admit: 2019-04-22 | Discharge: 2019-04-22 | Disposition: A | Discharge status: Home / Self Care | Payer: Medicare Other | Source: Ambulatory Visit | Attending: Internal Medicine | Admitting: Internal Medicine

## 2019-04-22 ENCOUNTER — Other Ambulatory Visit: Payer: Self-pay

## 2019-04-22 DIAGNOSIS — M810 Age-related osteoporosis without current pathological fracture: Secondary | ICD-10-CM | POA: Diagnosis not present

## 2019-05-03 DIAGNOSIS — H2513 Age-related nuclear cataract, bilateral: Secondary | ICD-10-CM | POA: Diagnosis not present

## 2019-05-03 DIAGNOSIS — H35373 Puckering of macula, bilateral: Secondary | ICD-10-CM | POA: Diagnosis not present

## 2019-05-03 DIAGNOSIS — H40033 Anatomical narrow angle, bilateral: Secondary | ICD-10-CM | POA: Diagnosis not present

## 2019-05-06 ENCOUNTER — Encounter: Payer: Self-pay | Admitting: Internal Medicine

## 2019-05-09 ENCOUNTER — Telehealth: Payer: Self-pay | Admitting: Internal Medicine

## 2019-05-09 ENCOUNTER — Telehealth: Payer: Self-pay | Admitting: *Deleted

## 2019-05-09 NOTE — Telephone Encounter (Signed)
Please advise 

## 2019-05-09 NOTE — Telephone Encounter (Signed)
Schedule AWV.  

## 2019-05-09 NOTE — Telephone Encounter (Signed)
Patient called to advise that she is having pain in her right thigh bone. States that this pain started when she began taking Fosamax and is continuing to get worse.  Requests a call at [phone] to advise how to proceed

## 2019-05-10 NOTE — Telephone Encounter (Signed)
Spoke to the patient and gave her the advice below from the MD-patient has agreed to speak with her PCP about this issue moving forward

## 2019-05-15 ENCOUNTER — Ambulatory Visit (INDEPENDENT_AMBULATORY_CARE_PROVIDER_SITE_OTHER): Payer: Medicare Other | Admitting: Family Medicine

## 2019-05-15 VITALS — BP 142/78 | Ht 62.0 in | Wt 120.0 lb

## 2019-05-15 DIAGNOSIS — Z0000 Encounter for general adult medical examination without abnormal findings: Secondary | ICD-10-CM

## 2019-05-15 NOTE — Progress Notes (Signed)
Presents today for [employer] Annual Wellness Visit   Date of last exam: 12/19/2018  Interpreter used for this visit? No  I connected with  Kelly RochesterJeanette C Mcdowell on 05/15/19 by a telephone and verified that I am speaking with the correct person using two identifiers.   I discussed the limitations of evaluation and management by telemedicine. The patient expressed understanding and agreed to proceed.   Patient Care Team: Shade FloodGreene, Jeffrey R, MD as PCP - General (Family Medicine) Sallye LatGroat, Christopher, MD as Consulting Physician (Ophthalmology)   Other items to address today:   Discussed Immunizations Discussed Eye/Dental     Other Screening:  Last lipid screening: 05-09-2017  ADVANCE DIRECTIVES: Discussed:yes On File: no Materials Provided:  Yes (mailed)  Immunization status:  Immunization History  Administered Date(s) Administered  . Pneumococcal Conjugate-13 03/13/2017     Health Maintenance Due  Topic Date Due  . TETANUS/TDAP  04/23/1962  . INFLUENZA VACCINE  02/02/2019     Functional Status Survey: Is the patient deaf or have difficulty hearing?: No Does the patient have difficulty seeing, even when wearing glasses/contacts?: No Does the patient have difficulty concentrating, remembering, or making decisions?: No Does the patient have difficulty walking or climbing stairs?: No Does the patient have difficulty dressing or bathing?: No Does the patient have difficulty doing errands alone such as visiting a doctor's office or shopping?: No   6CIT Screen 05/15/2019 05/02/2017  What Year? 0 points 0 points  What month? 0 points 0 points  What time? 0 points 0 points  Count back from 20 0 points 0 points  Months in reverse 0 points 0 points  Repeat phrase 0 points 0 points  Total Score 0 0        Clinical Support [facility] 05/15/2019 [facility]  AUDIT-C Score  0       Home Environment:  No difficulty climbing stairs One story with basement  No scattered rugs No grab bars Adequate lighting/free clutter   Patient Active Problem List   Diagnosis Date Noted  . 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 Hypothyroidism (acquired) 06/18/2012     Past Medical History:  Diagnosis Date  . Allergy   . Arthritis   . Cataract    both eyes  . 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 Thyroid disease      Past Surgical History:  Procedure Laterality Date  . CESAREAN SECTION    . TUBAL LIGATION       Family History  Problem Relation Age of Onset  . Congestive Heart Failure Father   . Cancer Sister        breast  . Breast cancer Sister   . Colon polyps Sister   . Cancer Maternal Grandmother   . Breast cancer Maternal Aunt   . Stomach cancer Maternal Aunt   . Colon cancer Neg Hx   . Esophageal cancer Neg Hx   . Rectal cancer Neg Hx      Social History   Socioeconomic History  . Marital status: Married    Spouse name: Not on file  . Number of children: Not on file  . Years of education: Not on file  . Highest education level: Not on file  Occupational History  . Not on file  Social Needs  . Financial resource strain: Not on file  . Food insecurity    Worry: Not on file    Inability: Not  on file  . Transportation needs    Medical: Not on file    Non-medical: Not on file  Tobacco Use  . Smoking status: Former Smoker    Quit date: 03/04/2005    Years since quitting: 14.2  . Smokeless tobacco: Never Used  Substance and Sexual Activity  . Alcohol use: No  . Drug use: No  . Sexual activity: Never    Birth control/protection: Surgical  Lifestyle  . Physical activity    Days per week: Not on file    Minutes per session: Not on file  . Stress: Not on file  Relationships  . Social Herbalist on phone: Not on file    Gets together: Not on file    Attends religious service: Not on file    Active member of club or organization: Not on file     Attends meetings of clubs or organizations: Not on file    Relationship status: Not on file  . Intimate partner violence    Fear of current or ex partner: Not on file    Emotionally abused: Not on file    Physically abused: Not on file    Forced sexual activity: Not on file  Other Topics Concern  . Not on file  Social History Narrative  . Not on file     No Known Allergies   Prior to Admission medications   Medication Sig Start Date End Date Taking? Authorizing Provider  Calcium Carb-Cholecalciferol (CALCIUM-VITAMIN D) 500-200 MG-UNIT tablet Take 1 tablet by mouth 1 day or 1 dose. 2 tablets once daily   Yes [provider]  GENERIC EXTERNAL MEDICATION 1 tablet daily as needed. As needed for diarrhea/OTC antidiarrheal medication   Yes [provider]  hydrOXYzine (ATARAX/VISTARIL) 25 MG tablet Take 1/2 tablet to one whole tablet by mouth daily as needed for itching 05/30/17  Yes Wendie Agreste, MD  ibuprofen (ADVIL,MOTRIN) 400 MG tablet Take 400 mg every 6 (six) hours as needed by mouth.   Yes [provider]  levothyroxine (SYNTHROID, LEVOTHROID) 88 MCG tablet Take 1 tablet (88 mcg total) by mouth daily before breakfast. 06/05/18  Yes Wendie Agreste, MD  alendronate (FOSAMAX) 70 MG tablet Take 1 tablet (70 mg total) by mouth once a week. Take with a full glass of water on an empty stomach. Patient not taking: Reported on 05/15/2019 03/04/19   Shamleffer, Melanie Crazier, MD  Multiple Vitamins-Minerals (MULTIVITAMIN WITH MINERALS) tablet Take 1 tablet by mouth daily.    [provider]     Depression screen [facility] 2/9 05/15/2019 12/19/2018 06/05/2018 12/01/2017 05/30/2017  Decreased Interest 0 0 0 0 0  Down, Depressed, Hopeless 0 0 0 0 0  PHQ - 2 Score 0 0 0 0 0     Fall Risk  05/15/2019 12/19/2018 06/05/2018 12/01/2017 05/30/2017  Falls in the past year? 0 0 0 No No  Number falls in past yr: 0 0 - - -  Injury with Fall? 0 0 - - -  Risk for fall  due to : - - - - -  Risk for fall due to: Comment - - - - -  Follow up Falls evaluation completed;Education provided Falls evaluation completed - - -      PHYSICAL EXAM: BP (!) 142/78 Comment: taken from previous  Ht 5\' 2"  (1.575 m) Comment: per patient  Wt 120 lb (54.4 kg)   BMI 21.95 kg/m    Wt Readings from Last 3  Encounters:  05/15/19 120 lb (54.4 kg)  03/01/19 120 lb 3.2 oz (54.5 kg)  12/19/18 120 lb 12.8 oz (54.8 kg)     No exam data present    Physical Exam   Education/Counseling provided regarding diet and exercise, prevention of chronic diseases, smoking/tobacco cessation, if applicable, and reviewed "Covered Medicare Preventive Services."

## 2019-05-15 NOTE — Patient Instructions (Addendum)
Thank you for taking time to come for your Medicare Wellness Visit. I appreciate your ongoing commitment to your health goals. Please review the following plan we discussed and let me know if I can assist you in the future.  Julie Greer LPN  Preventive Care 65 Years and Older, Female Preventive care refers to lifestyle choices and visits with your health care provider that can promote health and wellness. This includes:  A yearly physical exam. This is also called an annual well check.  Regular dental and eye exams.  Immunizations.  Screening for certain conditions.  Healthy lifestyle choices, such as diet and exercise. What can I expect for my preventive care visit? Physical exam Your health care provider will check:  Height and weight. These may be used to calculate body mass index (BMI), which is a measurement that tells if you are at a healthy weight.  Heart rate and blood pressure.  Your skin for abnormal spots. Counseling Your health care provider may ask you questions about:  Alcohol, tobacco, and drug use.  Emotional well-being.  Home and relationship well-being.  Sexual activity.  Eating habits.  History of falls.  Memory and ability to understand (cognition).  Work and work environment.  Pregnancy and menstrual history. What immunizations do I need?  Influenza (flu) vaccine  This is recommended every year. Tetanus, diphtheria, and pertussis (Tdap) vaccine  You may need a Td booster every 10 years. Varicella (chickenpox) vaccine  You may need this vaccine if you have not already been vaccinated. Zoster (shingles) vaccine  You may need this after age 60. Pneumococcal conjugate (PCV13) vaccine  One dose is recommended after age 65. Pneumococcal polysaccharide (PPSV23) vaccine  One dose is recommended after age 65. Measles, mumps, and rubella (MMR) vaccine  You may need at least one dose of MMR if you were born in 1957 or later. You may also  need a second dose. Meningococcal conjugate (MenACWY) vaccine  You may need this if you have certain conditions. Hepatitis A vaccine  You may need this if you have certain conditions or if you travel or work in places where you may be exposed to hepatitis A. Hepatitis B vaccine  You may need this if you have certain conditions or if you travel or work in places where you may be exposed to hepatitis B. Haemophilus influenzae type b (Hib) vaccine  You may need this if you have certain conditions. You may receive vaccines as individual doses or as more than one vaccine together in one shot (combination vaccines). Talk with your health care provider about the risks and benefits of combination vaccines. What tests do I need? Blood tests  Lipid and cholesterol levels. These may be checked every 5 years, or more frequently depending on your overall health.  Hepatitis C test.  Hepatitis B test. Screening  Lung cancer screening. You may have this screening every year starting at age 55 if you have a 30-pack-year history of smoking and currently smoke or have quit within the past 15 years.  Colorectal cancer screening. All adults should have this screening starting at age 50 and continuing until age 75. Your health care provider may recommend screening at age 45 if you are at increased risk. You will have tests every 1-10 years, depending on your results and the type of screening test.  Diabetes screening. This is done by checking your blood sugar (glucose) after you have not eaten for a while (fasting). You may have this done every 1-3   years.  Mammogram. This may be done every 1-2 years. Talk with your health care provider about how often you should have regular mammograms.  BRCA-related cancer screening. This may be done if you have a family history of breast, ovarian, tubal, or peritoneal cancers. Other tests  Sexually transmitted disease (STD) testing.  Bone density scan. This is done  to screen for osteoporosis. You may have this done starting at age 6. Follow these instructions at home: Eating and drinking  Eat a diet that includes fresh fruits and vegetables, whole grains, lean protein, and low-fat dairy products. Limit your intake of foods with high amounts of sugar, saturated fats, and salt.  Take vitamin and mineral supplements as recommended by your health care provider.  Do not drink alcohol if your health care provider tells you not to drink.  If you drink alcohol: ? Limit how much you have to 0-1 drink a day. ? Be aware of how much alcohol is in your drink. In the [place] one drink equals one 12 oz bottle of beer (355 mL), one 5 oz glass of wine (148 mL), or one 1 oz glass of hard liquor (44 mL). Lifestyle  Take daily care of your teeth and gums.  Stay active. Exercise for at least 30 minutes on 5 or more days each week.  Do not use any products that contain nicotine or tobacco, such as cigarettes, e-cigarettes, and chewing tobacco. If you need help quitting, ask your health care provider.  If you are sexually active, practice safe sex. Use a condom or other form of protection in order to prevent STIs (sexually transmitted infections).  Talk with your health care provider about taking a low-dose aspirin or statin. What's next?  Go to your health care provider once a year for a well check visit.  Ask your health care provider how often you should have your eyes and teeth checked.  Stay up to date on all vaccines. This information is not intended to replace advice given to you by your health care provider. Make sure you discuss any questions you have with your health care provider. Document Released: 07/17/2015 Document Revised: 06/14/2018 Document Reviewed: 06/14/2018 Elsevier Patient Education  2020 [employer].

## 2019-08-28 ENCOUNTER — Other Ambulatory Visit: Payer: Self-pay

## 2019-08-30 ENCOUNTER — Ambulatory Visit: Payer: Medicare Other | Admitting: Internal Medicine

## 2019-10-09 ENCOUNTER — Ambulatory Visit (INDEPENDENT_AMBULATORY_CARE_PROVIDER_SITE_OTHER): Payer: Medicare Other

## 2019-10-09 ENCOUNTER — Other Ambulatory Visit: Payer: Self-pay

## 2019-10-09 ENCOUNTER — Ambulatory Visit (INDEPENDENT_AMBULATORY_CARE_PROVIDER_SITE_OTHER): Payer: Medicare Other | Admitting: Family Medicine

## 2019-10-09 ENCOUNTER — Encounter: Payer: Self-pay | Admitting: Family Medicine

## 2019-10-09 VITALS — BP 128/72 | HR 72 | Temp 98.4°F | Ht 62.0 in | Wt 122.0 lb

## 2019-10-09 DIAGNOSIS — M1611 Unilateral primary osteoarthritis, right hip: Secondary | ICD-10-CM | POA: Diagnosis not present

## 2019-10-09 DIAGNOSIS — M25551 Pain in right hip: Secondary | ICD-10-CM

## 2019-10-09 DIAGNOSIS — M898X5 Other specified disorders of bone, thigh: Secondary | ICD-10-CM | POA: Diagnosis not present

## 2019-10-09 DIAGNOSIS — E039 Hypothyroidism, unspecified: Secondary | ICD-10-CM | POA: Diagnosis not present

## 2019-10-09 DIAGNOSIS — M79651 Pain in right thigh: Secondary | ICD-10-CM

## 2019-10-09 MED ORDER — LEVOTHYROXINE SODIUM 88 MCG PO TABS: 88.0000 ug | ORAL_TABLET | Freq: Every day | ORAL | 2 refills | Status: DC

## 2019-10-09 NOTE — Patient Instructions (Addendum)
No change in thyroid medication for now, no for any concerns any labs.  Right leg pain may be due to arthritis of the hip but I will check x-rays.  Okay to continue Tylenol for now.  Anticipate I will probably refer you to orthopedics but will let you know once I review x-ray results.  Thank you for coming in today.  Follow-up in 6 months for thyroid.     If you have lab work done today you will be contacted with your lab results within the next 2 weeks.  If you have not heard from [place] then please contact us. The fastest way to get your results is to register for My Chart.   IF you received an x-ray today, you will receive an invoice from [facility]. Please contact [facility]iology at [phone] with questions or concerns regarding your invoice.   IF you received labwork today, you will receive an invoice from [facility]. Please contact [facility] at [phone] with questions or concerns regarding your invoice.   Our billing staff will not be able to assist you with questions regarding bills from these companies.  You will be contacted with the lab results as soon as they are available. The fastest way to get your results is to activate your My Chart account. Instructions are located on the last page of this paperwork. If you have not heard from [place] regarding the results in 2 weeks, please contact this office.

## 2019-10-09 NOTE — Progress Notes (Signed)
Subjective:  Patient ID: Kelly Mcdowell, female    DOB: 07-03-1941  Age: 79 y.o. MRN: [id number]  CC:  Chief Complaint  Patient presents with  . Medication Refill    on the synthroid. pt hasn't had any issues with this medication. pt don't express any physical symptoms of hypothyroidism.  . Leg Pain    PT has pain in her R leg. pt states it started shortly after pt was given FOSAMAX. pt has stopped this medication, but the pain is presistant. pt states pain is located in her postior thigh and kalf. pt states sometimes it hurts so bad she can't move it.    HPI Kelly Mcdowell presents for  Hypothyroidism: Lab Results  Component Value Date   TSH 7.750 (H) 10/09/2019   Taking medication daily. Synthroid 3mcg qd. No new hot or cold intolerance. No new hair or skin changes, heart palpitations or new fatigue. No new weight changes.   Osteoporosis, R leg/hip pain.  Most recent bone density 04/22/2019, T score -3.1.  Lumbar spine.  Right femoral neck -1.8, left femoral neck -2.7.  No history of fracture, on calcium, vitamin D, Fosamax at time of bone density. Followed by endocrinologist Dr. Kelton Pillar.  Phone note reviewed from November 5.  Pain in her right thigh since starting Fosamax. R upper thigh, radiates to R calf area at times. Noted after taking fosamax. Stopped fosamax after 2 months - pain improved off fosamax but still having pain. Hurts to put weight on leg at times - not persistent.  No back pain.  Tx: tylenol. Min relief, ibuprofen 800mg  few times per day at times - mostly once per day.  Usually on outside, sometimes groin. Pain with hip flexion at times. No knee pain.  Still active - loads wagon with wood for wood stove. 6 steps up and down throughout day.  No unexplained wt loss, night sweats, fever, chills   2014 L hip XR: Mild bilateral hip degenerative changes and moderate lower lumbar spine degenerative changes.  History Patient Active Problem List   Diagnosis  Date Noted  . 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 Hypothyroidism (acquired) 06/18/2012   Past Medical History:  Diagnosis Date  . Allergy   . Arthritis   . Cataract    both eyes  . 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 Thyroid disease    Past Surgical History:  Procedure Laterality Date  . CESAREAN SECTION    . TUBAL LIGATION     No Known Allergies Prior to Admission medications   Medication Sig Start Date End Date Taking? Authorizing Provider  Calcium Carb-Cholecalciferol (CALCIUM-VITAMIN D) 500-200 MG-UNIT tablet Take 1 tablet by mouth 1 day or 1 dose. 2 tablets once daily   Yes [provider]  GENERIC EXTERNAL MEDICATION 1 tablet daily as needed. As needed for diarrhea/OTC antidiarrheal medication   Yes [provider]  hydrOXYzine (ATARAX/VISTARIL) 25 MG tablet Take 1/2 tablet to one whole tablet by mouth daily as needed for itching 05/30/17  Yes Wendie Agreste, MD  ibuprofen (ADVIL,MOTRIN) 400 MG tablet Take 400 mg every 6 (six) hours as needed by mouth.   Yes [provider]  levothyroxine (SYNTHROID, LEVOTHROID) 88 MCG tablet Take 1 tablet (88 mcg total) by mouth daily before breakfast. 06/05/18  Yes Wendie Agreste, MD  Multiple Vitamins-Minerals (MULTIVITAMIN WITH MINERALS) tablet Take 1 tablet by mouth daily.   Yes [provider]  alendronate (FOSAMAX)  70 MG tablet Take 1 tablet (70 mg total) by mouth once a week. Take with a full glass of water on an empty stomach. Patient not taking: Reported on 05/15/2019 03/04/19   Shamleffer, Konrad Dolores, MD   Social History   Socioeconomic History  . Marital status: Married    Spouse name: Not on file  . Number of children: Not on file  . Years of education: Not on file  . Highest education level: Not on file  Occupational History  . Not on file  Tobacco Use  . Smoking status: Former Smoker    Quit date: 03/04/2005    Years  since quitting: 14.6  . Smokeless tobacco: Never Used  Substance and Sexual Activity  . Alcohol use: No  . Drug use: No  . Sexual activity: Never    Birth control/protection: Surgical  Other Topics Concern  . Not on file  Social History Narrative  . Not on file   Social Determinants of Health   Financial Resource Strain:   . Difficulty of Paying Living Expenses:   Food Insecurity:   . Worried About Programme researcher, broadcasting/film/video in the Last Year:   . Barista in the Last Year:   Transportation Needs:   . Freight forwarder (Medical):   Marland Kitchen Lack of Transportation (Non-Medical):   Physical Activity:   . Days of Exercise per Week:   . Minutes of Exercise per Session:   Stress:   . Feeling of Stress :   Social Connections:   . Frequency of Communication with Friends and Family:   . Frequency of Social Gatherings with Friends and Family:   . Attends Religious Services:   . Active Member of Clubs or Organizations:   . Attends Banker Meetings:   Marland Kitchen Marital Status:   Intimate Partner Violence:   . Fear of Current or Ex-Partner:   . Emotionally Abused:   Marland Kitchen Physically Abused:   . Sexually Abused:     Review of Systems Per HPI  Objective:   Vitals:   10/09/19 1557  BP: 128/72  Pulse: 72  Temp: 98.4 F (36.9 C)  TempSrc: Temporal  SpO2: 98%  Weight: 122 lb (55.3 kg)  Height: 5\' 2"  (1.575 m)     Physical Exam Vitals reviewed.  Constitutional:      Appearance: She is well-developed.  HENT:     Head: Normocephalic and atraumatic.  Eyes:     Conjunctiva/sclera: Conjunctivae normal.     Pupils: Pupils are equal, round, and reactive to light.  Neck:     Vascular: No carotid bruit.     Comments: No thyromegaly, nodule.  Cardiovascular:     Rate and Rhythm: Normal rate and regular rhythm.     Heart sounds: Normal heart sounds.  Pulmonary:     Effort: Pulmonary effort is normal.     Breath sounds: Normal breath sounds.  Abdominal:     Palpations:  Abdomen is soft. There is no pulsatile mass.     Tenderness: There is no abdominal tenderness.  Musculoskeletal:     Comments: Lumbar spine slight decreased rotation, lateral flexion, minimal discomfort with extension, flexion intact.  Does not reproduce leg symptoms.  Negative seated straight leg raise  Right hip decreased internal and external rotation, positive , some reproduction of pain with internal rotation. Minimal tenderness over trochanteric bursa.  Right knee pain free range of motion, right lower leg, calf nontender  Skin:    General: Skin  is warm and dry.  Neurological:     Mental Status: She is alert and oriented to person, place, and time.  Psychiatric:        Behavior: Behavior normal.     DG HIP UNILAT W OR W/O PELVIS 2-3 VIEWS RIGHT  Result Date: 10/09/2019 CLINICAL DATA:  Right hip and femur pain. EXAM: DG HIP (WITH OR WITHOUT PELVIS) 2-3V RIGHT COMPARISON:  Right femur series performed today.  06/05/2013. FINDINGS: Degenerative changes in the hips bilaterally, right greater than left with joint space narrowing and spurring. SI joints symmetric and unremarkable. No acute bony abnormality. Specifically, no fracture, subluxation, or dislocation. 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 No acute bony abnormality. Electronically Signed   By: Charlett Nose M.D.   On: 10/09/2019 17:37   DG FEMUR, MIN 2 VIEWS RIGHT  Result Date: 10/09/2019 CLINICAL DATA:  Right hip and femur pain. EXAM: RIGHT FEMUR 2 VIEWS COMPARISON:  06/05/2013 FINDINGS: Degenerative changes in the right hip with joint space narrowing and spurring. No acute bony abnormality. Specifically, no fracture, subluxation, or dislocation. IMPRESSION: Moderate degenerative changes in the right hip. No acute bony abnormality. Electronically Signed   By: Charlett Nose M.D.   On: 10/09/2019 17:36      Assessment & Plan:  Kelly Mcdowell is a 79 y.o. female . Right hip pain - Plan: DG  HIP UNILAT W OR W/O PELVIS 2-3 VIEWS RIGHT Pain in right femur - Plan: DG FEMUR, MIN 2 VIEWS RIGHT  -Noted some improvement off Fosamax, but based on exam, x-ray, suspect degenerative joint disease as primary contributor.  Rare lower leg symptoms could be sciatica with lumbar source.  Potential PT for back symptoms, but will have orthopedic evaluation for right hip pain, DJD.    Hypothyroidism, unspecified type - Plan: TSH, levothyroxine (SYNTHROID) 88 MCG tablet  -Tolerating current dose Synthroid, check TSH.   Meds ordered this encounter  Medications  . levothyroxine (SYNTHROID) 88 MCG tablet    Sig: Take 1 tablet (88 mcg total) by mouth daily before breakfast.    Dispense:  90 tablet    Refill:  2   Patient Instructions   No change in thyroid medication for now, no for any concerns any labs.  Right leg pain may be due to arthritis of the hip but I will check x-rays.  Okay to continue Tylenol for now.  Anticipate I will probably refer you to orthopedics but will let you know once I review x-ray results.  Thank you for coming in today.  Follow-up in 6 months for thyroid.     If you have lab work done today you will be contacted with your lab results within the next 2 weeks.  If you have not heard from [place] then please contact us. The fastest way to get your results is to register for My Chart.   IF you received an x-ray today, you will receive an invoice from [facility]. Please contact [facility] Radiology at [phone] with questions or concerns regarding your invoice.   IF you received labwork today, you will receive an invoice from [facility]. Please contact [facility] at [phone] with questions or concerns regarding your invoice.   Our billing staff will not be able to assist you with questions regarding bills from these companies.  You will be contacted with the lab results as soon as they are available. The fastest way to get your results is to activate your My Chart  account. Instructions are located  on the last page of this paperwork. If you have not heard from [place] regarding the results in 2 weeks, please contact this office.         Signed, Merri Ray, MD [facility]

## 2019-10-10 LAB — TSH: TSH: 7.75 u[IU]/mL — ABNORMAL HIGH (ref 0.450–4.500)

## 2019-10-15 ENCOUNTER — Encounter: Payer: Self-pay | Admitting: Radiology

## 2019-11-20 ENCOUNTER — Other Ambulatory Visit: Payer: Self-pay

## 2019-11-20 ENCOUNTER — Ambulatory Visit (INDEPENDENT_AMBULATORY_CARE_PROVIDER_SITE_OTHER): Payer: Medicare Other | Admitting: Family Medicine

## 2019-11-20 ENCOUNTER — Encounter: Payer: Self-pay | Admitting: Family Medicine

## 2019-11-20 ENCOUNTER — Ambulatory Visit (INDEPENDENT_AMBULATORY_CARE_PROVIDER_SITE_OTHER): Payer: Medicare Other | Admitting: Cardiology

## 2019-11-20 ENCOUNTER — Encounter: Payer: Self-pay | Admitting: Cardiology

## 2019-11-20 VITALS — BP 120/74 | HR 73 | Temp 96.4°F | Ht 62.0 in | Wt 121.8 lb

## 2019-11-20 VITALS — BP 111/71 | HR 64 | Temp 97.2°F | Resp 15 | Ht 62.0 in | Wt 121.4 lb

## 2019-11-20 DIAGNOSIS — I4891 Unspecified atrial fibrillation: Secondary | ICD-10-CM | POA: Diagnosis not present

## 2019-11-20 DIAGNOSIS — R002 Palpitations: Secondary | ICD-10-CM | POA: Diagnosis not present

## 2019-11-20 DIAGNOSIS — E039 Hypothyroidism, unspecified: Secondary | ICD-10-CM

## 2019-11-20 DIAGNOSIS — R9431 Abnormal electrocardiogram [ECG] [EKG]: Secondary | ICD-10-CM

## 2019-11-20 MED ORDER — APIXABAN 5 MG PO TABS: 5.0000 mg | ORAL_TABLET | Freq: Two times a day (BID) | ORAL | 11 refills | Status: DC

## 2019-11-20 NOTE — Patient Instructions (Addendum)
  I will recheck thyroid test today.  Your heart is in atrial fibrillation. I will have you evaluated by cardiology today at 2:40pm to discuss this further.    Appointment today 11/20/2019 @2 :30 with  [facility] at [facility] in [place], [place] [address], [place], [place] Phone: [phone]  If you have lab work done today you will be contacted with your lab results within the next 2 weeks.  If you have not heard from [phone] then please contact us. The fastest way to get your results is to register for My Chart.   IF you received an x-ray today, you will receive an invoice from [facility]. Please contact [facility] at [phone] with questions or concerns regarding your invoice.   IF you received labwork today, you will receive an invoice from [place]. Please contact [facility] at [phone] with questions or concerns regarding your invoice.   Our billing staff will not be able to assist you with questions regarding bills from these companies.  You will be contacted with the lab results as soon as they are available. The fastest way to get your results is to activate your My Chart account. Instructions are located on the last page of this paperwork. If you have not heard from [phone] regarding the results in 2 weeks, please contact this office.

## 2019-11-20 NOTE — Progress Notes (Signed)
Subjective:  Patient ID: Kelly Mcdowell, female    DOB: 03-20-1941  Age: 79 y.o. MRN: [id number]  CC:  Chief Complaint  Patient presents with  . Hypothyroidism    pt is here today for recheck TSH, no concerns     HPI Kelly Mcdowell presents for   Hypothyroidism: Lab Results  Component Value Date   TSH 7.750 (H) 10/09/2019  slight elevation in tsh in April.  Taking medication daily. Synthroid 25mcg qd.  Has been more cold natured recently - past few months. No new hair or skin changes, or new fatigue. No new weight changes.  Felt some palpitations this am around 5:30 or 6.  heart racing about 50min. No CP/dizziness.  BP 120/[id number]. 1st episode in awhile. Few months ago felt sx's - takes deep breathe to help sx's.  No current palpitations. Rare missed dose - one day last week. 2-3 missed doses per month.     History Patient Active Problem List   Diagnosis Date Noted  . 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 Hypothyroidism (acquired) 06/18/2012   Past Medical History:  Diagnosis Date  . Allergy   . Arthritis   . Cataract    both eyes  . 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 Thyroid disease    Past Surgical History:  Procedure Laterality Date  . CESAREAN SECTION    . TUBAL LIGATION     No Known Allergies Prior to Admission medications   Medication Sig Start Date End Date Taking? Authorizing Provider  Calcium Carb-Cholecalciferol (CALCIUM-VITAMIN D) 500-200 MG-UNIT tablet Take 1 tablet by mouth 1 day or 1 dose. 2 tablets once daily   Yes [provider]  GENERIC EXTERNAL MEDICATION 1 tablet daily as needed. As needed for diarrhea/OTC antidiarrheal medication   Yes [provider]  hydrOXYzine (ATARAX/VISTARIL) 25 MG tablet Take 1/2 tablet to one whole tablet by mouth daily as needed for itching 05/30/17  Yes Wendie Agreste, MD  ibuprofen (ADVIL,MOTRIN) 400 MG tablet Take 400 mg every 6  (six) hours as needed by mouth.   Yes [provider]  levothyroxine (SYNTHROID) 88 MCG tablet Take 1 tablet (88 mcg total) by mouth daily before breakfast. 10/09/19  Yes Wendie Agreste, MD  Multiple Vitamins-Minerals (MULTIVITAMIN WITH MINERALS) tablet Take 1 tablet by mouth daily.   Yes [provider]   Social History   Socioeconomic History  . Marital status: Married    Spouse name: Not on file  . Number of children: Not on file  . Years of education: Not on file  . Highest education level: Not on file  Occupational History  . Not on file  Tobacco Use  . Smoking status: Former Smoker    Quit date: 03/04/2005    Years since quitting: 14.7  . Smokeless tobacco: Never Used  Substance and Sexual Activity  . Alcohol use: No  . Drug use: No  . Sexual activity: Never    Birth control/protection: Surgical  Other Topics Concern  . Not on file  Social History Narrative  . Not on file   Social Determinants of Health   Financial Resource Strain:   . Difficulty of Paying Living Expenses:   Food Insecurity:   . Worried About Charity fundraiser in the Last Year:   . Arboriculturist in the Last Year:   Transportation Needs:   . Film/video editor (Medical):   Marland Kitchen Lack of  Transportation (Non-Medical):   Physical Activity:   . Days of Exercise per Week:   . Minutes of Exercise per Session:   Stress:   . Feeling of Stress :   Social Connections:   . Frequency of Communication with Friends and Family:   . Frequency of Social Gatherings with Friends and Family:   . Attends Religious Services:   . Active Member of Clubs or Organizations:   . Attends Banker Meetings:   Marland Kitchen Marital Status:   Intimate Partner Violence:   . Fear of Current or Ex-Partner:   . Emotionally Abused:   Marland Kitchen Physically Abused:   . Sexually Abused:     Review of Systems   Objective:   Vitals:   11/20/19 0909  BP: 111/71  Pulse: 64  Resp: 15  Temp: (!) 97.2 F (36.2  C)  TempSrc: Temporal  SpO2: 94%  Weight: 121 lb 6.4 oz (55.1 kg)  Height: 5\' 2"  (1.575 m)     Physical Exam Vitals reviewed.  Constitutional:      Appearance: She is well-developed.  HENT:     Head: Normocephalic and atraumatic.  Eyes:     Conjunctiva/sclera: Conjunctivae normal.     Pupils: Pupils are equal, round, and reactive to light.  Neck:     Vascular: No carotid bruit.  Cardiovascular:     Rate and Rhythm: Tachycardia present. Rhythm irregular.     Heart sounds: Normal heart sounds.     Comments: Irregular irregular rhythm.  Pulmonary:     Effort: Pulmonary effort is normal.     Breath sounds: Normal breath sounds.  Abdominal:     Palpations: Abdomen is soft. There is no pulsatile mass.     Tenderness: There is no abdominal tenderness.  Skin:    General: Skin is warm and dry.  Neurological:     Mental Status: She is alert and oriented to person, place, and time.  Psychiatric:        Behavior: Behavior normal.     EKG atrial fibrillation with rate 135.  Nonspecific ST depression in 2, aVF, V4 through V6.  New from November 2018 EKG.   Assessment & Plan:  Kelly Mcdowell is a 79 y.o. female . Hypothyroidism, unspecified type - Plan: TSH  - borderline control prior by tsh. Repeat testing to determine changes.   Palpitations - Plan: EKG 12-Lead Atrial fibrillation, unspecified type (HCC)  - new onset afib this am vs. Paroxysmal possible with prior palpitations. No current anticoagulation.   - asymptomatic at present. BP stable. D/w cardiology - she will be seen this afternoon. ER precautions given.   No orders of the defined types were placed in this encounter.  Patient Instructions       If you have lab work done today you will be contacted with your lab results within the next 2 weeks.  If you have not heard from [phone] then please contact us. The fastest way to get your results is to register for My Chart.   IF you received an x-ray today, you will  receive an invoice from [facility]. Please contact [facility] at [phone] with questions or concerns regarding your invoice.   IF you received labwork today, you will receive an invoice from [place]. Please contact [facility] at [phone] with questions or concerns regarding your invoice.   Our billing staff will not be able to assist you with questions regarding bills from these companies.  You will be contacted with the  lab results as soon as they are available. The fastest way to get your results is to activate your My Chart account. Instructions are located on the last page of this paperwork. If you have not heard from [place] regarding the results in 2 weeks, please contact this office.         Signed, Merri Ray, MD [facility]

## 2019-11-20 NOTE — Progress Notes (Signed)
Cardiology Office Note   Date:  11/20/2019   ID:  Kelly, Mcdowell February 04, 1941, MRN [id number]  PCP:  [name], MD  Cardiologist:   No primary care provider on file. Referring:  [name], MD  Chief Complaint  Patient presents with  . Atrial Fibrillation      History of Present Illness: Kelly Mcdowell is a 79 y.o. female who presents for evaluation of atrial fibrillation.  She presented to her primary care today.  She is being managed for hypothyroidism.  She is noted to be in atrial fibrillation which seemed to start this morning.  Her rate was in the 120s.  She is not particularly symptomatic.  She has had no prior cardiac history.  She said she rolled over this morning and she noticed her heart was racing.  She had the appointment with her primary care doctor.  She says she felt this before but does not exert frequently and cannot really quantify or qualify it.  She is back in sinus rhythm right now and actually does not feel palpitations.  She otherwise feels okay.  She somewhat active.  She denies any chest pressure, neck or arm discomfort.  She has had no new shortness of breath, PND or orthopnea.  She has no palpitations, presyncope or syncope.  She has no weight gain or edema.   Past Medical History:  Diagnosis Date  . Allergy   . Arthritis   . Cataract    both eyes  . 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 Thyroid disease     Past Surgical History:  Procedure Laterality Date  . CESAREAN SECTION    . TUBAL LIGATION       Current Outpatient Medications  Medication Sig Dispense Refill  . Calcium Carb-Cholecalciferol (CALCIUM-VITAMIN D) 500-200 MG-UNIT tablet Take 1 tablet by mouth 2 (two) times daily. 2 tablets once daily/// d3 1000    . GENERIC EXTERNAL MEDICATION 1 tablet daily as needed. As needed for diarrhea/OTC antidiarrheal medication    . hydrOXYzine (ATARAX/VISTARIL) 25 MG tablet Take 1/2 tablet to one whole  tablet by mouth daily as needed for itching 30 tablet 1  . ibuprofen (ADVIL,MOTRIN) 400 MG tablet Take 400 mg every 6 (six) hours as needed by mouth.    . levothyroxine (SYNTHROID) 88 MCG tablet Take 1 tablet (88 mcg total) by mouth daily before breakfast. 90 tablet 2  . Multiple Vitamins-Minerals (MULTIVITAMIN WITH MINERALS) tablet Take 1 tablet by mouth daily.    Marland Kitchen apixaban (ELIQUIS) 5 MG TABS tablet Take 1 tablet (5 mg total) by mouth 2 (two) times daily. 60 tablet 11   No current facility-administered medications for this visit.    Allergies:   Patient has no known allergies.    Social History:  The patient  reports that she quit smoking about 14 years ago. She has never used smokeless tobacco. She reports that she does not drink alcohol or use drugs.   Family History:  The patient's family history includes Breast cancer in her maternal aunt and sister; Cancer in her maternal grandmother and sister; Colon polyps in her sister; Congestive Heart Failure in her father; Stomach cancer in her maternal aunt.    ROS:  Please see the history of present illness.   Otherwise, review of systems are positive for none.   All other systems are reviewed and negative.    PHYSICAL EXAM: VS:  BP 120/74   Pulse  73   Temp (!) 96.4 F (35.8 C)   Ht 5\' 2"  (1.575 m)   Wt 121 lb 12.8 oz (55.2 kg)   SpO2 96%   BMI 22.28 kg/m  , BMI Body mass index is 22.28 kg/m. GENERAL:  Well appearing HEENT:  Pupils equal round and reactive, fundi not visualized, oral mucosa unremarkable NECK:  No jugular venous distention, waveform within normal limits, carotid upstroke brisk and symmetric, no bruits, no thyromegaly LYMPHATICS:  No cervical, inguinal adenopathy LUNGS:  Clear to auscultation bilaterally BACK:  No CVA tenderness CHEST:  Unremarkable HEART:  PMI not displaced or sustained,S1 and S2 within normal limits, no S3, no S4, no clicks, no rubs, no murmurs ABD:  Flat, positive bowel sounds normal in  frequency in pitch, no bruits, no rebound, no guarding, no midline pulsatile mass, no hepatomegaly, no splenomegaly EXT:  2 plus pulses throughout, no edema, no cyanosis no clubbing SKIN:  No rashes no nodules NEURO:  Cranial nerves II through XII grossly intact, motor grossly intact throughout PSYCH:  Cognitively intact, oriented to person place and time    EKG:  EKG is ordered today. The ekg ordered today demonstrates limb lead reversal, unusual P wave axis, low voltage chest and precordial leads, poor anterior R wave progression, possible left atrial enlargement, no acute ST-T wave changes.   Recent Labs: 03/01/2019: ALT 12; BUN 17; Creatinine, Ser 0.88; Potassium 3.7; Sodium 143 10/09/2019: TSH 7.750    Lipid Panel    Component Value Date/Time   CHOL 271 (H) 05/09/2017 1202   TRIG 64 05/09/2017 1202   HDL 70 05/09/2017 1202   CHOLHDL 3.9 05/09/2017 1202   LDLCALC 188 (H) 05/09/2017 1202      Wt Readings from Last 3 Encounters:  11/20/19 121 lb 12.8 oz (55.2 kg)  11/20/19 121 lb 6.4 oz (55.1 kg)  10/09/19 122 lb (55.3 kg)      Other studies Reviewed: Additional studies/ records that were reviewed today include: Labs. Review of the above records demonstrates:  Please see elsewhere in the note.     ASSESSMENT AND PLAN:  ATRIAL FIB:   The patient had paroxysmal atrial fibrillation documented this morning.  I suspect she has done this previously.  Her CHA2DS2-VASc is 3.  She has no contraindication anticoagulation.  At this point I will not start beta-blockers or calcium channel blocker but would if she has recurrent palpitations.  I am going to check labs today to include CBC and a basic metabolic profile.  I will check an echocardiogram.  COVID EDUCATION: The patient is not interested in getting her vaccine but I educated her today.  Current medicines are reviewed at length with the patient today.  The patient does not have concerns regarding medicines.  The following  changes have been made:  no change  Labs/ tests ordered today include:   Orders Placed This Encounter  Procedures  . Basic metabolic panel  . CBC  . EKG 12-Lead  . ECHOCARDIOGRAM COMPLETE     Disposition:   FU with me in  3 months.   Signed, Minus Breeding, MD  11/20/2019 3:38 PM    [facility]

## 2019-11-20 NOTE — Patient Instructions (Signed)
Medication Instructions:  START ELIQUIS 5MG  TWICE A DAY *If you need a refill on your cardiac medications before your next appointment, please call your pharmacy*  Lab Work: Your physician recommends that you return for lab work TODAY (CBC, BMP) If you have labs (blood work) drawn today and your tests are completely normal, you will receive your results only by: MyChart Message (if you have MyChart) OR . A paper copy in the mail If you have any lab test that is abnormal or we need to change your treatment, we will call you to review the results.  Testing/Procedures: Your physician has requested that you have an echocardiogram. Echocardiography is a painless test that uses sound waves to create images of your heart. It provides your doctor with information about the size and shape of your heart and how well your heart's chambers and valves are working. This procedure takes approximately one hour. There are no restrictions for this procedure. [address]  Follow-Up: At [facility], you and your health needs are our priority.  As part of our continuing mission to provide you with exceptional heart care, we have created designated Provider Care Teams.  These Care Teams include your primary Cardiologist (physician) and Advanced Practice Providers (APPs -  Physician Assistants and Nurse Practitioners) who all work together to provide you with the care you need, when you need it.    Your next appointment:   3 month(s)  The format for your next appointment:   In Person  Provider:   [facility], MD

## 2019-11-21 LAB — CBC
Hematocrit: 41.7 % (ref 34.0–46.6)
Hemoglobin: 14.5 g/dL (ref 11.1–15.9)
MCH: 31.1 pg (ref 26.6–33.0)
MCHC: 34.8 g/dL (ref 31.5–35.7)
MCV: 90 fL (ref 79–97)
Platelets: 289 10*3/uL (ref 150–450)
RBC: 4.66 x10E6/uL (ref 3.77–5.28)
RDW: 12.4 % (ref 11.7–15.4)
WBC: 7.9 10*3/uL (ref 3.4–10.8)

## 2019-11-21 LAB — BASIC METABOLIC PANEL
BUN/Creatinine Ratio: 20 (ref 12–28)
BUN: 19 mg/dL (ref 8–27)
CO2: 25 mmol/L (ref 20–29)
Calcium: 9.9 mg/dL (ref 8.7–10.3)
Chloride: 108 mmol/L — ABNORMAL HIGH (ref 96–106)
Creatinine, Ser: 0.93 mg/dL (ref 0.57–1.00)
GFR calc Af Amer: 69 mL/min/{1.73_m2} (ref >59)
GFR calc non Af Amer: 60 mL/min/{1.73_m2} (ref >59)
Glucose: 88 mg/dL (ref 65–99)
Potassium: 4.7 mmol/L (ref 3.5–5.2)
Sodium: 149 mmol/L — ABNORMAL HIGH (ref 134–144)

## 2019-11-21 LAB — TSH: TSH: 6.23 u[IU]/mL — ABNORMAL HIGH (ref 0.450–4.500)

## 2019-11-26 ENCOUNTER — Telehealth: Payer: Self-pay

## 2019-11-26 NOTE — Telephone Encounter (Signed)
Prior auth submitted to cover my meds for Eliquis. [id number]

## 2019-12-09 NOTE — Telephone Encounter (Signed)
Eliquis approved by [facility] on 11/26/19, good through 07/03/20.

## 2019-12-10 ENCOUNTER — Other Ambulatory Visit: Payer: Self-pay

## 2019-12-10 ENCOUNTER — Ambulatory Visit (HOSPITAL_COMMUNITY): Payer: Medicare Other | Attending: Cardiovascular Disease

## 2019-12-10 DIAGNOSIS — I4891 Unspecified atrial fibrillation: Secondary | ICD-10-CM

## 2019-12-11 ENCOUNTER — Telehealth: Payer: Self-pay

## 2019-12-11 NOTE — Telephone Encounter (Signed)
-----   Message from Rollene Rotunda, MD sent at 12/10/2019 10:13 PM EDT ----- She has normal LV function.  There is some mild AI and I will follow this with another echo in a couple of years.  Call Ms. Punt with the results and send results to [name], MD

## 2019-12-11 NOTE — Telephone Encounter (Signed)
Follow up   Patient is returning call for test results. Please call. 

## 2019-12-11 NOTE — Telephone Encounter (Signed)
Spoke with patient. See result note.  

## 2019-12-11 NOTE — Telephone Encounter (Signed)
Left message for patient to call back for results of echo.

## 2020-01-01 ENCOUNTER — Ambulatory Visit (INDEPENDENT_AMBULATORY_CARE_PROVIDER_SITE_OTHER): Payer: Medicare Other | Admitting: Family Medicine

## 2020-01-01 ENCOUNTER — Other Ambulatory Visit: Payer: Self-pay

## 2020-01-01 DIAGNOSIS — E039 Hypothyroidism, unspecified: Secondary | ICD-10-CM | POA: Diagnosis not present

## 2020-01-02 LAB — TSH+FREE T4
Free T4: 1.95 ng/dL — ABNORMAL HIGH (ref 0.82–1.77)
TSH: 0.061 u[IU]/mL — ABNORMAL LOW (ref 0.450–4.500)

## 2020-02-20 DIAGNOSIS — Z7189 Other specified counseling: Secondary | ICD-10-CM | POA: Insufficient documentation

## 2020-02-20 DIAGNOSIS — I4891 Unspecified atrial fibrillation: Secondary | ICD-10-CM | POA: Insufficient documentation

## 2020-02-20 NOTE — Progress Notes (Signed)
Cardiology Office Note   Date:  02/21/2020   ID:  Kelly Mcdowell, Kelly Mcdowell July 29, 1940, MRN [id number]  PCP:  [name], MD  Cardiologist:   No primary care provider on file. Referring:  [name], MD  Chief Complaint  Patient presents with  . Atrial Fibrillation      History of Present Illness: Kelly Mcdowell is a 79 y.o. female who presents for evaluation of atrial fibrillation.  I sent her for an echo and she had normal LV function with mild AI.   She has her hypothyroidism followed by [name], MD but was found to have recently a low TSH and mildly elevated free T4.   Since I last saw her she has had no symptomatic paroxysms.  She clearly felt that the day.  She was in sinus rhythm with PACs on the most recent EKG.   Dr. Chilton Si is managing her Synthroid.   She denies any new cardiovascular symptoms.  She is not having any chest pressure, neck or arm discomfort.  She is not having any shortness of breath, PND or orthopnea.  She has no weight gain or edema.   Past Medical History:  Diagnosis Date  . Allergy   . Arthritis   . Cataract    both eyes  . 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 Thyroid disease     Past Surgical History:  Procedure Laterality Date  . CESAREAN SECTION    . TUBAL LIGATION       Current Outpatient Medications  Medication Sig Dispense Refill  . apixaban (ELIQUIS) 5 MG TABS tablet Take 1 tablet (5 mg total) by mouth 2 (two) times daily. 60 tablet 11  . Calcium Carb-Cholecalciferol (CALCIUM-VITAMIN D) 500-200 MG-UNIT tablet Take 1 tablet by mouth 2 (two) times daily. 2 tablets once daily/// d3 1000    . GENERIC EXTERNAL MEDICATION 1 tablet daily as needed. As needed for diarrhea/OTC antidiarrheal medication    . hydrOXYzine (ATARAX/VISTARIL) 25 MG tablet Take 1/2 tablet to one whole tablet by mouth daily as needed for itching 30 tablet 1  . ibuprofen (ADVIL,MOTRIN) 400 MG tablet Take 400 mg every 6 (six)  hours as needed by mouth.    . levothyroxine (SYNTHROID) 88 MCG tablet Take 1 tablet (88 mcg total) by mouth daily before breakfast. 90 tablet 2   No current facility-administered medications for this visit.    Allergies:   Patient has no known allergies.    ROS:  Please see the history of present illness.   Otherwise, review of systems are positive for mild ankle edema.   All other systems are reviewed and negative.    PHYSICAL EXAM: VS:  BP 138/64   Pulse 63   Ht 5\' 2"  (1.575 m)   Wt 121 lb 12.8 oz (55.2 kg)   SpO2 98%   BMI 22.28 kg/m  , BMI Body mass index is 22.28 kg/m. GENERAL:  Well appearing NECK:  No jugular venous distention, waveform within normal limits, carotid upstroke brisk and symmetric, no bruits, no thyromegaly LUNGS:  Clear to auscultation bilaterally CHEST:  Unremarkable HEART:  PMI not displaced or sustained,S1 and S2 within normal limits, no S3, no S4, no clicks, no rubs, no murmurs ABD:  Flat, positive bowel sounds normal in frequency in pitch, no bruits, no rebound, no guarding, no midline pulsatile mass, no hepatomegaly, no splenomegaly EXT:  2 plus pulses throughout, no edema, no cyanosis no  clubbing      EKG:  EKG is not ordered today.    Recent Labs: 03/01/2019: ALT 12 11/20/2019: BUN 19; Creatinine, Ser 0.93; Hemoglobin 14.5; Platelets 289; Potassium 4.7; Sodium 149 01/01/2020: TSH 0.061    Lipid Panel    Component Value Date/Time   CHOL 271 (H) 05/09/2017 1202   TRIG 64 05/09/2017 1202   HDL 70 05/09/2017 1202   CHOLHDL 3.9 05/09/2017 1202   LDLCALC 188 (H) 05/09/2017 1202      Wt Readings from Last 3 Encounters:  02/21/20 121 lb 12.8 oz (55.2 kg)  11/20/19 121 lb 12.8 oz (55.2 kg)  11/20/19 121 lb 6.4 oz (55.1 kg)      Other studies Reviewed: Additional studies/ records that were reviewed today include: Labs Review of the above records demonstrates:  Please see elsewhere in the note.     ASSESSMENT AND PLAN:  ATRIAL FIB:    The patient had paroxysmal atrial fibrillation.  Her CHA2DS2-VASc is 3.    She had no symptomatic paroxysms.  No change in therapy.  She will continue the anticoagulation.   HYPOTHYROIDISM: This is being regulated by [name], MD  AI:   This was found on the echo.  I will follow this clinically.  COVID EDUCATION:   She does not want to take the vaccine and we again had a long conversation about this.  Current medicines are reviewed at length with the patient today.  The patient does not have concerns regarding medicines.  The following changes have been made:  None  Labs/ tests ordered today include:  None No orders of the defined types were placed in this encounter.    Disposition:   FU with me in 12 months.   Signed, Rollene Rotunda, MD  02/21/2020 12:30 PM    [facility]

## 2020-02-21 ENCOUNTER — Encounter: Payer: Self-pay | Admitting: Cardiology

## 2020-02-21 ENCOUNTER — Other Ambulatory Visit: Payer: Self-pay

## 2020-02-21 ENCOUNTER — Ambulatory Visit (INDEPENDENT_AMBULATORY_CARE_PROVIDER_SITE_OTHER): Payer: Medicare Other | Admitting: Cardiology

## 2020-02-21 VITALS — BP 138/64 | HR 63 | Ht 62.0 in | Wt 121.8 lb

## 2020-02-21 DIAGNOSIS — I4891 Unspecified atrial fibrillation: Secondary | ICD-10-CM

## 2020-02-21 DIAGNOSIS — Z7189 Other specified counseling: Secondary | ICD-10-CM | POA: Diagnosis not present

## 2020-02-21 DIAGNOSIS — E039 Hypothyroidism, unspecified: Secondary | ICD-10-CM | POA: Diagnosis not present

## 2020-02-21 NOTE — Patient Instructions (Signed)
Medication Instructions:  No changes *If you need a refill on your cardiac medications before your next appointment, please call your pharmacy*  Lab Work: None ordered this visit If you have labs (blood work) drawn today and your tests are completely normal, you will receive your results only by: Marland Kitchen MyChart Message (if you have MyChart) OR . A paper copy in the mail If you have any lab test that is abnormal or we need to change your treatment, we will call you to review the results.  Testing/Procedures: None ordered this visit  Follow-Up: At [facility], you and your health needs are our priority.  As part of our continuing mission to provide you with exceptional heart care, we have created designated Provider Care Teams.  These Care Teams include your primary Cardiologist (physician) and Advanced Practice Providers (APPs -  Physician Assistants and Nurse Practitioners) who all work together to provide you with the care you need, when you need it.  Your next appointment:   12 month(s)  You will receive a reminder letter in the mail two months in advance. If you don't receive a letter, please call our office to schedule the follow-up appointment.  The format for your next appointment:   In Person  Provider:   Rollene Rotunda, MD

## 2020-02-26 ENCOUNTER — Encounter: Payer: Self-pay | Admitting: Family Medicine

## 2020-02-26 ENCOUNTER — Ambulatory Visit (INDEPENDENT_AMBULATORY_CARE_PROVIDER_SITE_OTHER): Payer: Medicare Other | Admitting: Family Medicine

## 2020-02-26 ENCOUNTER — Other Ambulatory Visit: Payer: Self-pay

## 2020-02-26 VITALS — BP 142/72 | HR 71 | Temp 97.2°F | Ht 62.0 in | Wt 121.0 lb

## 2020-02-26 DIAGNOSIS — E039 Hypothyroidism, unspecified: Secondary | ICD-10-CM | POA: Diagnosis not present

## 2020-02-26 DIAGNOSIS — E870 Hyperosmolality and hypernatremia: Secondary | ICD-10-CM

## 2020-02-26 NOTE — Progress Notes (Signed)
Subjective:  Patient ID: Kelly Mcdowell, female    DOB: [birth year]  Age: 79 y.o. MRN: [id number]  CC:  Chief Complaint  Patient presents with  . Follow-up    on hypothyroidism. Pt reports she is unsure if this is due to her thyroid, but pt states she can't stand high temetures. pt reports she needs to be in an A/C room. Pt also reports some swelling in her feet, and that she was told it could be from her thyroid. pt elevates her legs when possible and that seems to bring swelling down.    HPI Kelly Mcdowell presents for   Hypothyroidism: Lab Results  Component Value Date   TSH 0.061 (L) 01/01/2020  Recent elevated TSH, recommended decreasing to 1/2 pill on 2 days/week, continuing full pill other days per week. Synthroid 88 mcg. Has been on that dosing for about 7 weeks. Cold natured in May.  She does report some heat intolerance at times , some pedal edema at ankles that improves with leg elevation. Noted since May. Doing ok now. Resolves at night. D/w Dr. Antoine Poche - possible thyroid cause. No CP/dyspnea.  No hair/ skin changes. No new palpitations.   History of atrial fibrillation followed by Dr. Antoine Poche. Appointment August 20. No recent paroxysms. CHA2DS2-VASc score of 3. Anticoagulated with Eliquis since May.   Hypernatremia: Na 149 in May.  Repeat planned today.    History Patient Active Problem List   Diagnosis Date Noted  . Educated about COVID-19 virus infection 02/20/2020  . Atrial fibrillation (HCC) 02/20/2020  . 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 Hypothyroidism (acquired) 06/18/2012   Past Medical History:  Diagnosis Date  . Allergy   . Arthritis   . Cataract    both eyes  . 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 Thyroid disease    Past Surgical History:  Procedure Laterality Date  . CESAREAN SECTION    . TUBAL LIGATION     No Known Allergies Prior to Admission medications   Medication Sig  Start Date End Date Taking? Authorizing Provider  apixaban (ELIQUIS) 5 MG TABS tablet Take 1 tablet (5 mg total) by mouth 2 (two) times daily. 11/20/19  Yes Rollene Rotunda, MD  Calcium Carb-Cholecalciferol (CALCIUM-VITAMIN D) 500-200 MG-UNIT tablet Take 1 tablet by mouth 2 (two) times daily. 2 tablets once daily/// d3 1000   Yes [provider]  GENERIC EXTERNAL MEDICATION 1 tablet daily as needed. As needed for diarrhea/OTC antidiarrheal medication   Yes [provider]  hydrOXYzine (ATARAX/VISTARIL) 25 MG tablet Take 1/2 tablet to one whole tablet by mouth daily as needed for itching 05/30/17  Yes [name], MD  levothyroxine (SYNTHROID) 88 MCG tablet Take 1 tablet (88 mcg total) by mouth daily before breakfast. 10/09/19  Yes [name], MD  ibuprofen (ADVIL,MOTRIN) 400 MG tablet Take 400 mg every 6 (six) hours as needed by mouth. Patient not taking: Reported on 02/26/2020    [provider]   Social History   Socioeconomic History  . Marital status: Married    Spouse name: Not on file  . Number of children: Not on file  . Years of education: Not on file  . Highest education level: Not on file  Occupational History  . Not on file  Tobacco Use  . Smoking status: Former Smoker    Quit date: 03/04/2005    Years since quitting: 14.9  . Smokeless tobacco: Never Used  Vaping Use  . Vaping Use: Never used  Substance and Sexual Activity  . Alcohol use: No  . Drug use: No  . Sexual activity: Never    Birth control/protection: Surgical  Other Topics Concern  . Not on file  Social History Narrative   Married and lives with husband.  Three daughters.  12 grands.     Social Determinants of Health   Financial Resource Strain:   . Difficulty of Paying Living Expenses: Not on file  Food Insecurity:   . Worried About Programme researcher, broadcasting/film/video in the Last Year: Not on file  . Ran Out of Food in the Last Year: Not on file  Transportation Needs:   . Lack  of Transportation (Medical): Not on file  . Lack of Transportation (Non-Medical): Not on file  Physical Activity:   . Days of Exercise per Week: Not on file  . Minutes of Exercise per Session: Not on file  Stress:   . Feeling of Stress : Not on file  Social Connections:   . Frequency of Communication with Friends and Family: Not on file  . Frequency of Social Gatherings with Friends and Family: Not on file  . Attends Religious Services: Not on file  . Active Member of Clubs or Organizations: Not on file  . Attends Banker Meetings: Not on file  . Marital Status: Not on file  Intimate Partner Violence:   . Fear of Current or Ex-Partner: Not on file  . Emotionally Abused: Not on file  . Physically Abused: Not on file  . Sexually Abused: Not on file    Review of Systems Per HPI.   Objective:   Vitals:   02/26/20 1009 02/26/20 1013  BP: (!) 154/60 (!) 142/72  Pulse: 71   Temp: (!) 97.2 F (36.2 C)   TempSrc: Temporal   SpO2: 98%   Weight: 121 lb (54.9 kg)   Height: 5\' 2"  (1.575 m)      Physical Exam Vitals reviewed.  Constitutional:      Appearance: She is well-developed.  HENT:     Head: Normocephalic and atraumatic.  Eyes:     Conjunctiva/sclera: Conjunctivae normal.     Pupils: Pupils are equal, round, and reactive to light.  Neck:     Vascular: No carotid bruit.  Cardiovascular:     Rate and Rhythm: Normal rate and regular rhythm.     Heart sounds: Normal heart sounds. No murmur heard.   Pulmonary:     Effort: Pulmonary effort is normal.     Breath sounds: Normal breath sounds.  Abdominal:     Palpations: Abdomen is soft. There is no pulsatile mass.     Tenderness: There is no abdominal tenderness.  Musculoskeletal:     Right lower leg: No edema.     Left lower leg: No edema.  Skin:    General: Skin is warm and dry.  Neurological:     Mental Status: She is alert and oriented to person, place, and time.  Psychiatric:        Mood and  Affect: Mood normal.        Behavior: Behavior normal.        Assessment & Plan:  Kelly Mcdowell is a 79 y.o. female . Hypothyroidism, unspecified type - Plan: TSH + free T4  -Some potential symptoms of persistent overtreatment.  Has been decreasing to half dose twice per week since last low reading.  Check TSH, free T4 today.  Potentially may be contributing to intermittent edema, but no edema appreciated on exam.  Hypernatremia - Plan: Basic metabolic panel  -Repeat BMP  No orders of the defined types were placed in this encounter.  Patient Instructions     Depending on thyroid test today, we can fine-tune the regimen.  Continue half pill twice per week for now, 1 pill on other 5 days.  I will also repeat your sodium level today that was elevated at previous visit.  No other med changes at this time.  Thank you for coming in today  If you have lab work done today you will be contacted with your lab results within the next 2 weeks.  If you have not heard from [place] then please contact us. The fastest way to get your results is to register for My Chart.   IF you received an x-ray today, you will receive an invoice from [facility]. Please contact [facility] at [phone] with questions or concerns regarding your invoice.   IF you received labwork today, you will receive an invoice from [facility]. Please contact [facility] at [phone] with questions or concerns regarding your invoice.   Our billing staff will not be able to assist you with questions regarding bills from these companies.  You will be contacted with the lab results as soon as they are available. The fastest way to get your results is to activate your My Chart account. Instructions are located on the last page of this paperwork. If you have not heard from [place] regarding the results in 2 weeks, please contact this office.         Signed, Meredith Staggers, MD [facility]

## 2020-02-26 NOTE — Patient Instructions (Addendum)
   Depending on thyroid test today, we can fine-tune the regimen.  Continue half pill twice per week for now, 1 pill on other 5 days.  I will also repeat your sodium level today that was elevated at previous visit.  No other med changes at this time.  Thank you for coming in today  If you have lab work done today you will be contacted with your lab results within the next 2 weeks.  If you have not heard from [place] then please contact us. The fastest way to get your results is to register for My Chart.   IF you received an x-ray today, you will receive an invoice from [facility]. Please contact [facility] at [phone] with questions or concerns regarding your invoice.   IF you received labwork today, you will receive an invoice from [place]. Please contact [facility] at [phone] with questions or concerns regarding your invoice.   Our billing staff will not be able to assist you with questions regarding bills from these companies.  You will be contacted with the lab results as soon as they are available. The fastest way to get your results is to activate your My Chart account. Instructions are located on the last page of this paperwork. If you have not heard from [place] regarding the results in 2 weeks, please contact this office.

## 2020-02-27 LAB — BASIC METABOLIC PANEL
BUN/Creatinine Ratio: 18 (ref 12–28)
BUN: 14 mg/dL (ref 8–27)
CO2: 21 mmol/L (ref 20–29)
Calcium: 9.1 mg/dL (ref 8.7–10.3)
Chloride: 108 mmol/L — ABNORMAL HIGH (ref 96–106)
Creatinine, Ser: 0.8 mg/dL (ref 0.57–1.00)
GFR calc Af Amer: 83 mL/min/{1.73_m2} (ref >59)
GFR calc non Af Amer: 72 mL/min/{1.73_m2} (ref >59)
Glucose: 92 mg/dL (ref 65–99)
Potassium: 4 mmol/L (ref 3.5–5.2)
Sodium: 144 mmol/L (ref 134–144)

## 2020-02-27 LAB — TSH+FREE T4
Free T4: 1.75 ng/dL (ref 0.82–1.77)
TSH: 0.087 u[IU]/mL — ABNORMAL LOW (ref 0.450–4.500)

## 2020-03-17 ENCOUNTER — Encounter: Payer: Self-pay | Admitting: Family Medicine

## 2020-04-02 ENCOUNTER — Encounter: Payer: Self-pay | Admitting: Family Medicine

## 2020-04-02 NOTE — Progress Notes (Signed)
Lab only visit, pt not seen.  

## 2020-05-04 DIAGNOSIS — H40033 Anatomical narrow angle, bilateral: Secondary | ICD-10-CM | POA: Diagnosis not present

## 2020-05-04 DIAGNOSIS — H35373 Puckering of macula, bilateral: Secondary | ICD-10-CM | POA: Diagnosis not present

## 2020-05-04 DIAGNOSIS — H2513 Age-related nuclear cataract, bilateral: Secondary | ICD-10-CM | POA: Diagnosis not present

## 2020-05-27 ENCOUNTER — Encounter: Payer: Self-pay | Admitting: Family Medicine

## 2020-05-27 ENCOUNTER — Ambulatory Visit (INDEPENDENT_AMBULATORY_CARE_PROVIDER_SITE_OTHER): Payer: Medicare Other | Admitting: Family Medicine

## 2020-05-27 ENCOUNTER — Other Ambulatory Visit: Payer: Self-pay

## 2020-05-27 VITALS — BP 130/73 | HR 63 | Temp 97.7°F | Ht 62.0 in | Wt 121.0 lb

## 2020-05-27 DIAGNOSIS — E039 Hypothyroidism, unspecified: Secondary | ICD-10-CM

## 2020-05-27 DIAGNOSIS — Z23 Encounter for immunization: Secondary | ICD-10-CM | POA: Diagnosis not present

## 2020-05-27 NOTE — Patient Instructions (Addendum)
°  No change in medications for now, but depending on the reading we may be able to adjust to a 75 mcg tablet.  I will let you know once I see results.  Let me know if there are questions and take care.   If you have lab work done today you will be contacted with your lab results within the next 2 weeks.  If you have not heard from [place] then please contact us. The fastest way to get your results is to register for My Chart.   IF you received an x-ray today, you will receive an invoice from [facility]. Please contact [facility] at [phone] with questions or concerns regarding your invoice.   IF you received labwork today, you will receive an invoice from [place]. Please contact [facility] at [phone] with questions or concerns regarding your invoice.   Our billing staff will not be able to assist you with questions regarding bills from these companies.  You will be contacted with the lab results as soon as they are available. The fastest way to get your results is to activate your My Chart account. Instructions are located on the last page of this paperwork. If you have not heard from [place] regarding the results in 2 weeks, please contact this office.

## 2020-05-27 NOTE — Progress Notes (Signed)
Subjective:  Patient ID: Kelly Mcdowell, female    DOB: 07-Mar-1941  Age: 79 y.o. MRN: [id number]  CC:  Chief Complaint  Patient presents with  . Follow-up    on hypothyroidism.Pt reports having some issues regulating this condtion. Pt reports no physical symptoms of this condtion that she has noticed.     HPI Kelly Mcdowell presents for  Hypothyroidism: Lab Results  Component Value Date   TSH 0.087 (L) 02/26/2020  Low TSH in August.  Regimen was discussed with changing to a half a pill of her Synthroid 88 mcg to 3 days/week instead of 2 days/week.  Some heat tolerance issues at last visit, pedal edema.  History of atrial fibrillation, on anticoagulation.  Total weekly dose - , average daily dose on current regimen.  feeling ok on current dose. Some increased hair loss- possible past few months.  Taking medication daily. Less heat intolerance.less feet swelling. No heart palpitations, still infrequent. no new fatigue. No new weight changes.  Wt Readings from Last 3 Encounters:  05/27/20 121 lb (54.9 kg)  02/26/20 121 lb (54.9 kg)  02/21/20 121 lb 12.8 oz (55.2 kg)    History Patient Active Problem List   Diagnosis Date Noted  . Educated about COVID-19 virus infection 02/20/2020  . Atrial fibrillation (HCC) 02/20/2020  . 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 Hypothyroidism 12/15/2016  . Hip joint pain 09/16/2013  . 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 Hypothyroidism (acquired) 06/18/2012   Past Medical History:  Diagnosis Date  . Allergy   . Arthritis   . Cataract    both eyes  . 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 Thyroid disease    Past Surgical History:  Procedure Laterality Date  . CESAREAN SECTION    . TUBAL LIGATION     No Known Allergies Prior to Admission medications   Medication Sig Start Date End Date Taking? Authorizing Provider  apixaban (ELIQUIS) 5 MG TABS  tablet Take 1 tablet (5 mg total) by mouth 2 (two) times daily. 11/20/19  Yes Rollene Rotunda, MD  Calcium Carb-Cholecalciferol (CALCIUM-VITAMIN D) 500-200 MG-UNIT tablet Take 1 tablet by mouth 2 (two) times daily. 2 tablets once daily/// d3 1000   Yes [provider]  GENERIC EXTERNAL MEDICATION 1 tablet daily as needed. As needed for diarrhea/OTC antidiarrheal medication   Yes [provider]  hydrOXYzine (ATARAX/VISTARIL) 25 MG tablet Take 1/2 tablet to one whole tablet by mouth daily as needed for itching 05/30/17  Yes [name], MD  ibuprofen (ADVIL,MOTRIN) 400 MG tablet Take 400 mg by mouth every 6 (six) hours as needed.    Yes [provider]  levothyroxine (SYNTHROID) 88 MCG tablet Take 1 tablet (88 mcg total) by mouth daily before breakfast. 10/09/19  Yes [name], MD   Social History   Socioeconomic History  . Marital status: Married    Spouse name: Not on file  . Number of children: Not on file  . Years of education: Not on file  . Highest education level: Not on file  Occupational History  . Not on file  Tobacco Use  . Smoking status: Former Smoker    Quit date: 03/04/2005    Years since quitting: 15.2  . Smokeless tobacco: Never Used  Vaping Use  . Vaping Use: Never used  Substance and Sexual Activity  . Alcohol use: No  . Drug use: No  .  Sexual activity: Never    Birth control/protection: Surgical  Other Topics Concern  . Not on file  Social History Narrative   Married and lives with husband.  Three daughters.  12 grands.     Social Determinants of Health   Financial Resource Strain:   . Difficulty of Paying Living Expenses: Not on file  Food Insecurity:   . Worried About Programme researcher, broadcasting/film/video in the Last Year: Not on file  . Ran Out of Food in the Last Year: Not on file  Transportation Needs:   . Lack of Transportation (Medical): Not on file  . Lack of Transportation (Non-Medical): Not on file  Physical Activity:     . Days of Exercise per Week: Not on file  . Minutes of Exercise per Session: Not on file  Stress:   . Feeling of Stress : Not on file  Social Connections:   . Frequency of Communication with Friends and Family: Not on file  . Frequency of Social Gatherings with Friends and Family: Not on file  . Attends Religious Services: Not on file  . Active Member of Clubs or Organizations: Not on file  . Attends Banker Meetings: Not on file  . Marital Status: Not on file  Intimate Partner Violence:   . Fear of Current or Ex-Partner: Not on file  . Emotionally Abused: Not on file  . Physically Abused: Not on file  . Sexually Abused: Not on file    Review of Systems  Per HPI Objective:   Vitals:   05/27/20 1049  BP: 130/73  Pulse: 63  Temp: 97.7 F (36.5 C)  TempSrc: Temporal  SpO2: 98%  Weight: 121 lb (54.9 kg)  Height: 5\' 2"  (1.575 m)     Physical Exam Vitals reviewed.  Constitutional:      Appearance: She is well-developed.  HENT:     Head: Normocephalic and atraumatic.  Eyes:     Conjunctiva/sclera: Conjunctivae normal.     Pupils: Pupils are equal, round, and reactive to light.  Neck:     Vascular: No carotid bruit.     Comments: No thyromegaly/nodules/mass. Cardiovascular:     Rate and Rhythm: Normal rate and regular rhythm.     Heart sounds: Normal heart sounds.  Pulmonary:     Effort: Pulmonary effort is normal.     Breath sounds: Normal breath sounds.  Abdominal:     Palpations: Abdomen is soft. There is no pulsatile mass.     Tenderness: There is no abdominal tenderness.  Skin:    General: Skin is warm and dry.     Comments: No patches of hair loss or scalp abnormalities appreciated.  Neurological:     Mental Status: She is alert and oriented to person, place, and time.  Psychiatric:        Behavior: Behavior normal.        Assessment & Plan:  Kelly Mcdowell is a 79 y.o. female . Hypothyroidism, unspecified type - Plan: TSH + free  T4  -Tolerating current regimen, some hair loss noted, will check TSH, free T4, potential option to decrease to 75 mcg tablet, as current average daily dose of 69 mcg at this time.  Need for vaccination - Plan: Pneumococcal polysaccharide vaccine 23-valent greater than or equal to 2yo subcutaneous/IM   No orders of the defined types were placed in this encounter.  Patient Instructions    No change in medications for now, but depending on the reading we may  be able to adjust to a 75 mcg tablet.  I will let you know once I see results.  Let me know if there are questions and take care.   If you have lab work done today you will be contacted with your lab results within the next 2 weeks.  If you have not heard from [place] then please contact us. The fastest way to get your results is to register for My Chart.   IF you received an x-ray today, you will receive an invoice from [facility]. Please contact [facility] at [phone] with questions or concerns regarding your invoice.   IF you received labwork today, you will receive an invoice from [place]. Please contact [facility] at [phone] with questions or concerns regarding your invoice.   Our billing staff will not be able to assist you with questions regarding bills from these companies.  You will be contacted with the lab results as soon as they are available. The fastest way to get your results is to activate your My Chart account. Instructions are located on the last page of this paperwork. If you have not heard from [place] regarding the results in 2 weeks, please contact this office.         Signed, Meredith Staggers, MD [facility]

## 2020-05-28 LAB — TSH+FREE T4
Free T4: 1.94 ng/dL — ABNORMAL HIGH (ref 0.82–1.77)
TSH: 0.845 u[IU]/mL (ref 0.450–4.500)

## 2020-06-05 ENCOUNTER — Telehealth: Payer: Self-pay | Admitting: Family Medicine

## 2020-06-05 DIAGNOSIS — Z13220 Encounter for screening for lipoid disorders: Secondary | ICD-10-CM

## 2020-06-05 DIAGNOSIS — Z0000 Encounter for general adult medical examination without abnormal findings: Secondary | ICD-10-CM

## 2020-06-05 DIAGNOSIS — Z131 Encounter for screening for diabetes mellitus: Secondary | ICD-10-CM

## 2020-06-05 NOTE — Telephone Encounter (Signed)
ordered

## 2020-06-05 NOTE — Telephone Encounter (Signed)
Reminder to put in orders for lab appt on 2/21. Patient follow up appt is 2/25.

## 2020-06-15 ENCOUNTER — Telehealth: Payer: Self-pay | Admitting: *Deleted

## 2020-06-15 NOTE — Telephone Encounter (Signed)
Schedule AWV.  

## 2020-06-16 ENCOUNTER — Ambulatory Visit (INDEPENDENT_AMBULATORY_CARE_PROVIDER_SITE_OTHER): Payer: Medicare Other | Admitting: Emergency Medicine

## 2020-06-16 VITALS — BP 142/72 | Ht 62.0 in | Wt 121.0 lb

## 2020-06-16 DIAGNOSIS — Z0000 Encounter for general adult medical examination without abnormal findings: Secondary | ICD-10-CM | POA: Diagnosis not present

## 2020-06-16 NOTE — Progress Notes (Signed)
Presents today for [employer] Visit   Date of last exam: 05-27-2020  Interpreter used for this visit?   I connected with  Kelly Mcdowell on 06/16/20 by a telephone application and verified that I am speaking with the correct person using two identifiers.   I discussed the limitations of evaluation and management by telemedicine. The patient expressed understanding and agreed to proceed.  Patient location: home  Provider location: in office  I provided 20 minutes of non face - to - face time during this encounter.   Patient Care Team: [name], MD as PCP - General (Family Medicine) Sallye Lat, MD as Consulting Physician (Ophthalmology)   Other items to address today:   Discussed Eye/Dental Discussed Immunizations Follow up schedule 2-25 @ 10:40 Dr. Neva Seat   Other Screening: Last screening for diabetes 02/27/2020 Last lipid screening:[id number]  ADVANCE DIRECTIVES: Discussed: yes On File: no Materials Provided: yes  Immunization status:  Immunization History  Administered Date(s) Administered  . Pneumococcal Conjugate-13 03/13/2017  . Pneumococcal Polysaccharide-23 05/27/2020     Health Maintenance Due  Topic Date Due  . COVID-19 Vaccine (1) Never done     Functional Status Survey: Is the patient deaf or have difficulty hearing?: No Does the patient have difficulty seeing, even when wearing glasses/contacts?: No Does the patient have difficulty concentrating, remembering, or making decisions?: No Does the patient have difficulty walking or climbing stairs?: No Does the patient have difficulty dressing or bathing?: No Does the patient have difficulty doing errands alone such as visiting a doctor's office or shopping?: No   6CIT Screen 06/16/2020 05/15/2019 05/02/2017  What Year? 0 points 0 points 0 points  What month? 0 points 0 points 0 points  What time? 0 points 0 points 0 points  Count back from 20 0 points 0  points 0 points  Months in reverse 0 points 0 points 0 points  Repeat phrase 0 points 0 points 0 points  Total Score 0 0 0      Flowsheet Row Clinical Support from 06/16/2020 in [facility]  AUDIT-C Score 0       Home Environment:   Can climb stairs using handrails Lives in one story home No scattered rugs Yes grab bars Adequate lighting/ no clutter    Patient Active Problem List   Diagnosis Date Noted  . Educated about COVID-19 virus infection 02/20/2020  . Atrial fibrillation (HCC) 02/20/2020  . 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 Hypothyroidism 12/15/2016  . Hip joint pain 09/16/2013  . 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 Hypothyroidism (acquired) 06/18/2012     Past Medical History:  Diagnosis Date  . Allergy   . Arthritis   . Cataract    both eyes  . 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 Thyroid disease      Past Surgical History:  Procedure Laterality Date  . CESAREAN SECTION    . TUBAL LIGATION       Family History  Problem Relation Age of Onset  . Congestive Heart Failure Father   . Cancer Sister        breast  . Breast cancer Sister   . Colon polyps Sister   . Cancer Maternal Grandmother   . Breast cancer Maternal Aunt   . Stomach cancer Maternal Aunt   . Colon cancer Neg Hx   . Esophageal cancer Neg Hx   . Rectal  cancer Neg Hx      Social History   Socioeconomic History  . Marital status: Married    Spouse name: Not on file  . Number of children: Not on file  . Years of education: Not on file  . Highest education level: Not on file  Occupational History  . Not on file  Tobacco Use  . Smoking status: Former Smoker    Quit date: 03/04/2005    Years since quitting: 15.2  . Smokeless tobacco: Never Used  Vaping Use  . Vaping Use: Never used  Substance and Sexual Activity  . Alcohol use: No  . Drug use: No  . Sexual activity: Never     Birth control/protection: Surgical  Other Topics Concern  . Not on file  Social History Narrative   Married and lives with husband.  Three daughters.  12 grands.     Social Determinants of Health   Financial Resource Strain: Not on file  Food Insecurity: Not on file  Transportation Needs: Not on file  Physical Activity: Not on file  Stress: Not on file  Social Connections: Not on file  Intimate Partner Violence: Not on file     No Known Allergies   Prior to Admission medications   Medication Sig Start Date End Date Taking? Authorizing Provider  apixaban (ELIQUIS) 5 MG TABS tablet Take 1 tablet (5 mg total) by mouth 2 (two) times daily. 11/20/19  Yes Rollene Rotunda, MD  Calcium Carb-Cholecalciferol (CALCIUM-VITAMIN D) 500-200 MG-UNIT tablet Take 1 tablet by mouth 2 (two) times daily. 2 tablets once daily/// d3 1000   Yes [provider]  GENERIC EXTERNAL MEDICATION 1 tablet daily as needed. As needed for diarrhea/OTC antidiarrheal medication   Yes [provider]  hydrOXYzine (ATARAX/VISTARIL) 25 MG tablet Take 1/2 tablet to one whole tablet by mouth daily as needed for itching 05/30/17  Yes [name], MD  ibuprofen (ADVIL,MOTRIN) 400 MG tablet Take 400 mg by mouth every 6 (six) hours as needed.    Yes [provider]  levothyroxine (SYNTHROID) 88 MCG tablet Take 1 tablet (88 mcg total) by mouth daily before breakfast. 10/09/19  Yes [name], MD     Depression screen [facility] 2/9 06/16/2020 05/27/2020 11/20/2019 10/09/2019 05/15/2019  Decreased Interest 0 0 0 0 0  Down, Depressed, Hopeless 0 0 0 0 0  PHQ - 2 Score 0 0 0 0 0     Fall Risk  05/27/2020 05/27/2020 02/26/2020 11/20/2019 10/09/2019  Falls in the past year? 0 0 0 0 0  Number falls in past yr: - - - - -  Injury with Fall? - - - - -  Risk for fall due to : - - - - -  Risk for fall due to: Comment - - - - -  Follow up Falls evaluation completed Falls evaluation completed Falls  evaluation completed Falls evaluation completed Falls evaluation completed      PHYSICAL EXAM: BP (!) 142/72 Comment: taken from previous visit  Ht 5\' 2"  (1.575 m)   Wt 121 lb (54.9 kg)   BMI 22.13 kg/m    Wt Readings from Last 3 Encounters:  06/16/20 121 lb (54.9 kg)  05/27/20 121 lb (54.9 kg)  02/26/20 121 lb (54.9 kg)      Education/Counseling provided regarding diet and exercise, prevention of chronic diseases, smoking/tobacco cessation, if applicable, and reviewed "Covered Medicare Preventive Services."

## 2020-06-16 NOTE — Patient Instructions (Addendum)
Thank you for taking time to come for your Medicare Wellness Visit. I appreciate your ongoing commitment to your health goals. Please review the following plan we discussed and let me know if I can assist you in the future.  Julie Greer LPN  Preventive Care 77 Years and Older, Female Preventive care refers to lifestyle choices and visits with your health care provider that can promote health and wellness. This includes:  A yearly physical exam. This is also called an annual well check.  Regular dental and eye exams.  Immunizations.  Screening for certain conditions.  Healthy lifestyle choices, such as diet and exercise. What can I expect for my preventive care visit? Physical exam Your health care provider will check:  Height and weight. These may be used to calculate body mass index (BMI), which is a measurement that tells if you are at a healthy weight.  Heart rate and blood pressure.  Your skin for abnormal spots. Counseling Your health care provider may ask you questions about:  Alcohol, tobacco, and drug use.  Emotional well-being.  Home and relationship well-being.  Sexual activity.  Eating habits.  History of falls.  Memory and ability to understand (cognition).  Work and work environment.  Pregnancy and menstrual history. What immunizations do I need?  Influenza (flu) vaccine  This is recommended every year. Tetanus, diphtheria, and pertussis (Tdap) vaccine  You may need a Td booster every 10 years. Varicella (chickenpox) vaccine  You may need this vaccine if you have not already been vaccinated. Zoster (shingles) vaccine  You may need this after age 60. Pneumococcal conjugate (PCV13) vaccine  One dose is recommended after age 77. Pneumococcal polysaccharide (PPSV23) vaccine  One dose is recommended after age 77. Measles, mumps, and rubella (MMR) vaccine  You may need at least one dose of MMR if you were born in 1957 or later. You may also  need a second dose. Meningococcal conjugate (MenACWY) vaccine  You may need this if you have certain conditions. Hepatitis A vaccine  You may need this if you have certain conditions or if you travel or work in places where you may be exposed to hepatitis A. Hepatitis B vaccine  You may need this if you have certain conditions or if you travel or work in places where you may be exposed to hepatitis B. Haemophilus influenzae type b (Hib) vaccine  You may need this if you have certain conditions. You may receive vaccines as individual doses or as more than one vaccine together in one shot (combination vaccines). Talk with your health care provider about the risks and benefits of combination vaccines. What tests do I need? Blood tests  Lipid and cholesterol levels. These may be checked every 5 years, or more frequently depending on your overall health.  Hepatitis C test.  Hepatitis B test. Screening  Lung cancer screening. You may have this screening every year starting at age 55 if you have a 30-pack-year history of smoking and currently smoke or have quit within the past 15 years.  Colorectal cancer screening. All adults should have this screening starting at age 50 and continuing until age 75. Your health care provider may recommend screening at age 45 if you are at increased risk. You will have tests every 1-10 years, depending on your results and the type of screening test.  Diabetes screening. This is done by checking your blood sugar (glucose) after you have not eaten for a while (fasting). You may have this done every 1-3   years.  Mammogram. This may be done every 1-2 years. Talk with your health care provider about how often you should have regular mammograms.  BRCA-related cancer screening. This may be done if you have a family history of breast, ovarian, tubal, or peritoneal cancers. Other tests  Sexually transmitted disease (STD) testing.  Bone density scan. This is done  to screen for osteoporosis. You may have this done starting at age 41. Follow these instructions at home: Eating and drinking  Eat a diet that includes fresh fruits and vegetables, whole grains, lean protein, and low-fat dairy products. Limit your intake of foods with high amounts of sugar, saturated fats, and salt.  Take vitamin and mineral supplements as recommended by your health care provider.  Do not drink alcohol if your health care provider tells you not to drink.  If you drink alcohol: ? Limit how much you have to 0-1 drink a day. ? Be aware of how much alcohol is in your drink. In the [place] one drink equals one 12 oz bottle of beer (355 mL), one 5 oz glass of wine (148 mL), or one 1 oz glass of hard liquor (44 mL). Lifestyle  Take daily care of your teeth and gums.  Stay active. Exercise for at least 30 minutes on 5 or more days each week.  Do not use any products that contain nicotine or tobacco, such as cigarettes, e-cigarettes, and chewing tobacco. If you need help quitting, ask your health care provider.  If you are sexually active, practice safe sex. Use a condom or other form of protection in order to prevent STIs (sexually transmitted infections).  Talk with your health care provider about taking a low-dose aspirin or statin. What's next?  Go to your health care provider once a year for a well check visit.  Ask your health care provider how often you should have your eyes and teeth checked.  Stay up to date on all vaccines. This information is not intended to replace advice given to you by your health care provider. Make sure you discuss any questions you have with your health care provider. Document Revised: 06/14/2018 Document Reviewed: 06/14/2018 Elsevier Patient Education  2020 [employer].

## 2020-08-04 ENCOUNTER — Telehealth: Payer: Self-pay | Admitting: Pharmacist

## 2020-08-04 MED ORDER — APIXABAN 5 MG PO TABS: 5.0000 mg | ORAL_TABLET | Freq: Two times a day (BID) | ORAL | 11 refills | Status: DC

## 2020-08-04 NOTE — Telephone Encounter (Signed)
PA approved through 07/03/21, refill sent in.

## 2020-08-04 NOTE — Telephone Encounter (Signed)
Prior authorization for Eliquis submitted through [facility] Part D insurance. Confirmed with pharmacy that PA is needed.

## 2020-08-10 ENCOUNTER — Other Ambulatory Visit: Payer: Self-pay

## 2020-08-10 NOTE — Telephone Encounter (Signed)
Received a fax from a refill request of eliquis stating prefer xarelto or warfarin. Will route to pharmd pool to make judgement call on how to proceed

## 2020-08-13 MED ORDER — APIXABAN 5 MG PO TABS: 5.0000 mg | ORAL_TABLET | Freq: Two times a day (BID) | ORAL | 5 refills | Status: DC

## 2020-08-13 NOTE — Telephone Encounter (Signed)
Eliquis PA was already approved. Refill sent in.

## 2020-08-24 ENCOUNTER — Ambulatory Visit: Payer: Medicare Other

## 2020-08-24 ENCOUNTER — Other Ambulatory Visit: Payer: Self-pay

## 2020-08-24 DIAGNOSIS — Z0000 Encounter for general adult medical examination without abnormal findings: Secondary | ICD-10-CM

## 2020-08-24 DIAGNOSIS — Z131 Encounter for screening for diabetes mellitus: Secondary | ICD-10-CM | POA: Diagnosis not present

## 2020-08-24 DIAGNOSIS — Z13220 Encounter for screening for lipoid disorders: Secondary | ICD-10-CM

## 2020-08-25 LAB — COMPREHENSIVE METABOLIC PANEL
ALT: 20 IU/L (ref 0–32)
AST: 32 IU/L (ref 0–40)
Albumin/Globulin Ratio: 1.6 (ref 1.2–2.2)
Albumin: 4.2 g/dL (ref 3.7–4.7)
Alkaline Phosphatase: 88 IU/L (ref 44–121)
BUN/Creatinine Ratio: 17 (ref 12–28)
BUN: 15 mg/dL (ref 8–27)
Bilirubin Total: 0.4 mg/dL (ref 0.0–1.2)
CO2: 23 mmol/L (ref 20–29)
Calcium: 9 mg/dL (ref 8.7–10.3)
Chloride: 108 mmol/L — ABNORMAL HIGH (ref 96–106)
Creatinine, Ser: 0.9 mg/dL (ref 0.57–1.00)
GFR calc Af Amer: 71 mL/min/{1.73_m2} (ref >59)
GFR calc non Af Amer: 62 mL/min/{1.73_m2} (ref >59)
Globulin, Total: 2.6 g/dL (ref 1.5–4.5)
Glucose: 94 mg/dL (ref 65–99)
Potassium: 4.3 mmol/L (ref 3.5–5.2)
Sodium: 145 mmol/L — ABNORMAL HIGH (ref 134–144)
Total Protein: 6.8 g/dL (ref 6.0–8.5)

## 2020-08-28 ENCOUNTER — Ambulatory Visit (INDEPENDENT_AMBULATORY_CARE_PROVIDER_SITE_OTHER): Payer: Medicare Other | Admitting: Family Medicine

## 2020-08-28 ENCOUNTER — Other Ambulatory Visit: Payer: Self-pay

## 2020-08-28 VITALS — BP 136/81 | HR 74 | Temp 98.3°F | Resp 15 | Ht 62.0 in | Wt 118.2 lb

## 2020-08-28 DIAGNOSIS — E039 Hypothyroidism, unspecified: Secondary | ICD-10-CM

## 2020-08-28 DIAGNOSIS — M25551 Pain in right hip: Secondary | ICD-10-CM

## 2020-08-28 DIAGNOSIS — M25552 Pain in left hip: Secondary | ICD-10-CM

## 2020-08-28 MED ORDER — LEVOTHYROXINE SODIUM 88 MCG PO TABS
88.0000 ug | ORAL_TABLET | Freq: Every day | ORAL | 2 refills | Status: DC
Start: 2020-08-28 — End: 2021-02-12

## 2020-08-28 MED ORDER — DICLOFENAC SODIUM 1 % EX GEL
4.0000 g | Freq: Four times a day (QID) | CUTANEOUS | 1 refills | Status: DC
Start: 2020-08-28 — End: 2020-12-15

## 2020-08-28 NOTE — Progress Notes (Signed)
Subjective:  Patient ID: Kelly Mcdowell, female    DOB: 1940/11/17  Age: 80 y.o. MRN: [id number]  CC:  Chief Complaint  Patient presents with  . Hypothyroidism    Pt here for recheck thyroid has been feeling achy kind of tired over the past 1-2 months   . medication review    Pt wants to check if she should be taking ibuprofen given she takes a blood thinner     HPI Kelly Mcdowell presents for   Hypothyroidism: Lab Results  Component Value Date   TSH 1.720 08/24/2020   Taking medication daily. Synthroid alternating with 44 mcg 3 times per week. (average daily dose ).   No new hot or cold intolerance. No new hair or skin changes, no new heart palpitations - still periodic.  No new weight changes.  Some aching at times and tired past few months at times. Arthritis in legs, pain in outside of left hip - started last month. NKI. Able to walk, but sore. Outside both hips hurt at times, sore in low back in am - improves with movement. Leg soreness radiates to thighs at times.  Taking ibuprofen 800mg  qd 2-3 days per week. Tylenol with activity.  She is on Eliquis for afib.    History Patient Active Problem List   Diagnosis Date Noted  . Educated about COVID-19 virus infection 02/20/2020  . Atrial fibrillation (HCC) 02/20/2020  . 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 Hypothyroidism 12/15/2016  . Hip joint pain 09/16/2013  . 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 Hypothyroidism (acquired) 06/18/2012   Past Medical History:  Diagnosis Date  . Allergy   . Arthritis   . Cataract    both eyes  . 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 Thyroid disease    Past Surgical History:  Procedure Laterality Date  . CESAREAN SECTION    . TUBAL LIGATION     No Known Allergies Prior to Admission medications   Medication Sig Start Date End Date Taking? Authorizing Provider  apixaban (ELIQUIS) 5 MG TABS  tablet Take 1 tablet (5 mg total) by mouth 2 (two) times daily. 08/13/20  Yes 10/11/20, MD  Calcium Carb-Cholecalciferol (CALCIUM-VITAMIN D) 500-200 MG-UNIT tablet Take 1 tablet by mouth 2 (two) times daily. 2 tablets once daily/// d3 Rollene Rotunda 1000   Yes [provider]  GENERIC EXTERNAL MEDICATION 1 tablet daily as needed. As needed for diarrhea/OTC antidiarrheal medication   Yes [provider]  hydrOXYzine (ATARAX/VISTARIL) 25 MG tablet Take 1/2 tablet to one whole tablet by mouth daily as needed for itching 05/30/17  Yes 06/01/17, MD  ibuprofen (ADVIL,MOTRIN) 400 MG tablet Take 400 mg by mouth every 6 (six) hours as needed.    Yes [provider]  levothyroxine (SYNTHROID) 88 MCG tablet Take 1 tablet (88 mcg total) by mouth daily before breakfast. 10/09/19  Yes 12/09/19, MD   Social History   Socioeconomic History  . Marital status: Married    Spouse name: Not on file  . Number of children: Not on file  . Years of education: Not on file  . Highest education level: Not on file  Occupational History  . Not on file  Tobacco Use  . Smoking status: Former Smoker    Quit date: 03/04/2005    Years since quitting: 15.4  . Smokeless tobacco: Never Used  Vaping Use  . Vaping Use:  Never used  Substance and Sexual Activity  . Alcohol use: No  . Drug use: No  . Sexual activity: Never    Birth control/protection: Surgical  Other Topics Concern  . Not on file  Social History Narrative   Married and lives with husband.  Three daughters.  12 grands.     Social Determinants of Health   Financial Resource Strain: Not on file  Food Insecurity: Not on file  Transportation Needs: Not on file  Physical Activity: Not on file  Stress: Not on file  Social Connections: Not on file  Intimate Partner Violence: Not on file    Review of Systems  Per HPI.  Objective:   Vitals:   08/28/20 1042  BP: 136/81  Pulse: 74  Resp: 15  Temp: 98.3 F (36.8  C)  TempSrc: Temporal  SpO2: 96%  Weight: 118 lb 3.2 oz (53.6 kg)  Height: 5\' 2"  (1.575 m)     Physical Exam Vitals reviewed.  Constitutional:      Appearance: She is well-developed and well-nourished.  HENT:     Head: Normocephalic and atraumatic.  Eyes:     Extraocular Movements: EOM normal.     Conjunctiva/sclera: Conjunctivae normal.     Pupils: Pupils are equal, round, and reactive to light.  Neck:     Vascular: No carotid bruit.  Cardiovascular:     Rate and Rhythm: Normal rate. Rhythm irregular.     Pulses: Intact distal pulses.     Heart sounds: Normal heart sounds.  Pulmonary:     Effort: Pulmonary effort is normal.     Breath sounds: Normal breath sounds.  Abdominal:     Palpations: Abdomen is soft. There is no pulsatile mass.     Tenderness: There is no abdominal tenderness.  Skin:    General: Skin is warm and dry.  Neurological:     Mental Status: She is alert and oriented to person, place, and time.  Psychiatric:        Mood and Affect: Mood and affect normal.        Behavior: Behavior normal.        Assessment & Plan:  Kelly Mcdowell is a 80 y.o. female . Bilateral hip pain - Plan: diclofenac Sodium (VOLTAREN) 1 % GEL  -No known injury or falls.  Imaging deferred.  Potentially could be component of osteoarthritis of hip versus trochanteric bursitis.  We will try Voltaren gel initially, samples given, recheck next few weeks.  Hypothyroidism, unspecified type - Plan: TSH, levothyroxine (SYNTHROID) 88 MCG tablet  -Tolerating current dose of levothyroxine with alternating 88, 44 mcg.  Check levels.  Meds ordered this encounter  Medications  . levothyroxine (SYNTHROID) 88 MCG tablet    Sig: Take 1 tablet (88 mcg total) by mouth daily before breakfast. 4 days per week - alternating with 1/2 tablet on other 3 days.    Dispense:  90 tablet    Refill:  2  . diclofenac Sodium (VOLTAREN) 1 % GEL    Sig: Apply 4 g topically 4 (four) times daily.     Dispense:  100 g    Refill:  1   Patient Instructions    Okay to take tylenol. Stop ibuprofen. Can try topical antiinflammatory to hips up to 4 times per day. Follow up in next 2 weeks if not better and can check xray if needed at that time.  Return to the clinic or go to the nearest emergency room if any of your  symptoms worsen or new symptoms occur.  No change in thyroid meds for now. Thanks for coming in today.    If you have lab work done today you will be contacted with your lab results within the next 2 weeks.  If you have not heard from [place] then please contact us. The fastest way to get your results is to register for My Chart.   IF you received an x-ray today, you will receive an invoice from [facility]. Please contact [facility] at [phone] with questions or concerns regarding your invoice.   IF you received labwork today, you will receive an invoice from [place]. Please contact [facility] at [phone] with questions or concerns regarding your invoice.   Our billing staff will not be able to assist you with questions regarding bills from these companies.  You will be contacted with the lab results as soon as they are available. The fastest way to get your results is to activate your My Chart account. Instructions are located on the last page of this paperwork. If you have not heard from [place] regarding the results in 2 weeks, please contact this office.          Signed, Meredith Staggers, MD [facility]

## 2020-08-28 NOTE — Patient Instructions (Addendum)
  Okay to take tylenol. Stop ibuprofen. Can try topical antiinflammatory to hips up to 4 times per day. Follow up in next 2 weeks if not better and can check xray if needed at that time.  Return to the clinic or go to the nearest emergency room if any of your symptoms worsen or new symptoms occur.  No change in thyroid meds for now. Thanks for coming in today.    If you have lab work done today you will be contacted with your lab results within the next 2 weeks.  If you have not heard from [place] then please contact us. The fastest way to get your results is to register for My Chart.   IF you received an x-ray today, you will receive an invoice from [facility]. Please contact [facility] at [phone] with questions or concerns regarding your invoice.   IF you received labwork today, you will receive an invoice from [place]. Please contact [facility] at [phone] with questions or concerns regarding your invoice.   Our billing staff will not be able to assist you with questions regarding bills from these companies.  You will be contacted with the lab results as soon as they are available. The fastest way to get your results is to activate your My Chart account. Instructions are located on the last page of this paperwork. If you have not heard from [place] regarding the results in 2 weeks, please contact this office.

## 2020-08-29 ENCOUNTER — Encounter: Payer: Self-pay | Admitting: Family Medicine

## 2020-09-18 ENCOUNTER — Encounter: Payer: Self-pay | Admitting: Family Medicine

## 2020-09-18 ENCOUNTER — Ambulatory Visit (INDEPENDENT_AMBULATORY_CARE_PROVIDER_SITE_OTHER): Payer: Medicare Other | Admitting: Family Medicine

## 2020-09-18 ENCOUNTER — Other Ambulatory Visit: Payer: Self-pay

## 2020-09-18 VITALS — BP 126/82 | HR 66 | Temp 98.1°F | Ht 62.0 in | Wt 122.0 lb

## 2020-09-18 DIAGNOSIS — M25551 Pain in right hip: Secondary | ICD-10-CM

## 2020-09-18 DIAGNOSIS — R12 Heartburn: Secondary | ICD-10-CM

## 2020-09-18 DIAGNOSIS — M25552 Pain in left hip: Secondary | ICD-10-CM

## 2020-09-18 MED ORDER — FAMOTIDINE 10 MG PO TABS
10.0000 mg | ORAL_TABLET | Freq: Two times a day (BID) | ORAL | 1 refills | Status: DC
Start: 2020-09-18 — End: 2020-12-15

## 2020-09-18 MED ORDER — TRAMADOL HCL 50 MG PO TABS: 50.0000 mg | ORAL_TABLET | Freq: Three times a day (TID) | ORAL | 0 refills | Status: AC | PRN

## 2020-09-18 NOTE — Progress Notes (Signed)
Subjective:  Patient ID: Kelly Mcdowell, female    DOB: Jun 17, 1941  Age: 80 y.o. MRN: [id number]  CC:  Chief Complaint  Patient presents with  . Follow-up    On Hip Pain. Pt reports no change with the pain since last OV. Pt states the pain gets worse the longer she sits down. Pt also reports an inability to sleep on her L side.    HPI Kelly Mcdowell presents for   Bilateral hip pain  Discussed at February 25 visit.  Pain outside of her left hip was discussed previously, then both hips at times, including low back at times at her last visit.  Ibuprofen 800 mg few days per week use at that time, of note she is on Eliquis for A. fib.  Osteoarthritis versus trochanteric bursitis discussed, initial attempt at Voltaren gel topical, sample given.  More sore last night than today. Pain on outside of left hip, hurts to lay on that side. comes and goes, throbbing at times. Min relief with voltaren gel few times per day for few days.   R hip: On outside of R hip,pain into front of thigh at times, only occasional low back pain. Sore to flex leg. No statin use. No falls, injury , or change in time. Does have to carry wood for wood stove - pain has been limiting this.  No change with voltaren. Feels like R leg pain started after 2 months of fosamax use - stopped use. Min change off fosamax.   Tx: tylenol.   No hx of seizures.   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 No acute bony abnormality. IMPRESSION: Moderate degenerative changes in the right hip. No acute bony Abnormality.  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 No acute abnormalities. Walks stooped/leaning forward for years.    [place]: Indigestion for past few months, no blood in stool, night sweats, fevers or weight loss.  No recent ibuprofen.  Tx: tums, rolaids, other otc med - every day or other day.  No  vomiting. No CP, no dyspnea  Able to swallow food ok. .  No tobacco, no alcohol.  1-2 cups coffee or soda per day. No usual spicy food.   History Patient Active Problem List   Diagnosis Date Noted  . Educated about COVID-19 virus infection 02/20/2020  . Atrial fibrillation (HCC) 02/20/2020  . 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 Hypothyroidism 12/15/2016  . Hip joint pain 09/16/2013  . 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 Hypothyroidism (acquired) 06/18/2012   Past Medical History:  Diagnosis Date  . Allergy   . Arthritis   . Cataract    both eyes  . 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 Thyroid disease    Past Surgical History:  Procedure Laterality Date  . CESAREAN SECTION    . TUBAL LIGATION     No Known Allergies Prior to Admission medications   Medication Sig Start Date End Date Taking? Authorizing Provider  apixaban (ELIQUIS) 5 MG TABS tablet Take 1 tablet (5 mg total) by mouth 2 (two) times daily. 08/13/20  Yes Rollene Rotunda, MD  Calcium Carb-Cholecalciferol (CALCIUM-VITAMIN D) 500-200 MG-UNIT tablet Take 1 tablet by mouth 2 (two) times daily. 2 tablets once daily/// d3 1000   Yes [provider]  diclofenac Sodium (VOLTAREN) 1 % GEL Apply 4 g topically 4 (four) times daily. 08/28/20  Yes [name], MD  GENERIC EXTERNAL MEDICATION 1 tablet daily as needed. As needed for diarrhea/OTC antidiarrheal medication   Yes [provider]  hydrOXYzine (ATARAX/VISTARIL) 25 MG tablet Take 1/2 tablet to one whole tablet by mouth daily as needed for itching 05/30/17  Yes [name], MD  ibuprofen (ADVIL,MOTRIN) 400 MG tablet Take 400 mg by mouth every 6 (six) hours as needed.    Yes [provider]  levothyroxine (SYNTHROID) 88 MCG tablet Take 1 tablet (88 mcg total) by mouth daily before breakfast. 4 days per week - alternating with 1/2 tablet on other 3 days.  08/28/20  Yes [name], MD   Social History   Socioeconomic History  . Marital status: Married    Spouse name: Not on file  . Number of children: Not on file  . Years of education: Not on file  . Highest education level: Not on file  Occupational History  . Not on file  Tobacco Use  . Smoking status: Former Smoker    Quit date: 03/04/2005    Years since quitting: 15.5  . Smokeless tobacco: Never Used  Vaping Use  . Vaping Use: Never used  Substance and Sexual Activity  . Alcohol use: No  . Drug use: No  . Sexual activity: Never    Birth control/protection: Surgical  Other Topics Concern  . Not on file  Social History Narrative   Married and lives with husband.  Three daughters.  12 grands.     Social Determinants of Health   Financial Resource Strain: Not on file  Food Insecurity: Not on file  Transportation Needs: Not on file  Physical Activity: Not on file  Stress: Not on file  Social Connections: Not on file  Intimate Partner Violence: Not on file    Review of Systems   Objective:   Vitals:   09/18/20 1045 09/18/20 1133  BP: (!) 144/77 126/82  Pulse: 66   Temp: 98.1 F (36.7 C)   TempSrc: Temporal   SpO2: 98%   Weight: 122 lb (55.3 kg)   Height: 5\' 2"  (1.575 m)      Physical Exam Constitutional:      General: She is not in acute distress.    Appearance: She is well-developed.  HENT:     Head: Normocephalic and atraumatic.  Cardiovascular:     Rate and Rhythm: Normal rate.  Pulmonary:     Effort: Pulmonary effort is normal.  Abdominal:     General: Abdomen is flat. Bowel sounds are normal. There is no distension.     Palpations: There is no mass.     Tenderness: There is no abdominal tenderness. There is no guarding.  Musculoskeletal:     Comments: leaning forward with ambulation.  Right hip with decreased range of motion, some discomfort laterally with internal and external rotation, tender to palpation over the trochanteric  bursa/lateral hip.  Left hip slight decreased range of motion, tender to palpation over trochanteric bursa.  Pain laterally with external rotation.  Negative seated straight leg raise, no focal bony tenderness of the lumbar spine or paraspinal muscles.  Equal lower extremity strength.  Neurological:     Mental Status: She is alert and oriented to person, place, and time.  Psychiatric:        Mood and Affect: Mood normal.        Behavior: Behavior normal.     30 minutes spent during visit, greater than 50% counseling and assimilation of  information, chart review, and discussion of plan.    Assessment & Plan:  Kelly Mcdowell is a 80 y.o. female . Bilateral hip pain - Plan: traMADol (ULTRAM) 50 MG tablet, Ambulatory referral to Orthopedic Surgery, DG HIPS BILAT W OR W/O PELVIS 2V  -Possible combined picture of worsening osteoarthritis along with trochanteric bursitis.  Has had some degenerative lumbar disease but symptoms not likely radicular pain/lumbar source at this time.  Minimal change with Voltaren topical.  Possible need for injection but given possible combination of osteoarthritis with the bursitis will defer injection to orthopedics, urgent referral placed.  Tramadol provided for now, updated x-rays ordered.  RTC/ER precautions given.  Possible side effects and risks of medications were discussed with understanding expressed.  Heartburn - Plan: famotidine (PEPCID) 10 MG tablet  -Trial of famotidine twice daily, handout given on heartburn, follow-up in the next few weeks if symptoms persist as may need gastroenterology eval.  RTC/ER precautions.  No red flags on history or exam today.  Meds ordered this encounter  Medications  . famotidine (PEPCID) 10 MG tablet    Sig: Take 1 tablet (10 mg total) by mouth 2 (two) times daily.    Dispense:  60 tablet    Refill:  1  . traMADol (ULTRAM) 50 MG tablet    Sig: Take 1 tablet (50 mg total) by mouth every 8 (eight) hours as needed for up  to 5 days.    Dispense:  15 tablet    Refill:  0   Patient Instructions   Take famotidine twice per day every day.  See info on heartburn below. Follow up in 1 month if not improved or still needing medicine.   Tramadol if needed for pain, but I will refer you to orthopaedics for your hip pains and check updated xrays. Those can be done at [address].   Return to the clinic or go to the nearest emergency room if any of your symptoms worsen or new symptoms occur.   [email or web address]">  Heartburn Heartburn is a type of pain or discomfort that can happen in the throat or chest. It is often described as a burning pain. It may also cause a bad, acid-like taste in the mouth. Heartburn may feel worse when you lie down or bend over, and it is often worse at night. Heartburn may be caused by stomach contents that move back up into the esophagus (reflux). Follow these instructions at home: Eating and drinking  Avoid certain foods and drinks as told by your health care provider. This may include: ? Coffee and tea, with or without caffeine. ? Drinks that contain alcohol. ? Energy drinks and sports drinks. ? Carbonated drinks or sodas. ? Chocolate and cocoa. ? Peppermint and mint flavorings. ? Garlic and onions. ? Horseradish. ? Spicy and acidic foods, including peppers, chili powder, curry powder, vinegar, hot sauces, and barbecue sauce. ? Citrus fruit juices and citrus fruits, such as oranges, lemons, and limes. ? Tomato-based foods, such as red sauce, chili, salsa, and pizza with red sauce. ? Fried and fatty foods, such as donuts, french fries, potato chips, and high-fat dressings. ? High-fat meats, such as hot dogs and fatty cuts of red and white meats, such as rib eye steak, sausage, ham, and bacon. ? High-fat dairy items, such as whole milk, butter, and cream cheese.  Eat small, frequent meals instead of large meals.  Avoid drinking  large amounts of liquid with your meals.  Avoid eating meals during  the 2-3 hours before bedtime.  Avoid lying down right after you eat.  Do not exercise right after you eat.   Lifestyle  If you are overweight, reduce your weight to an amount that is healthy for you. Ask your health care provider for guidance about a safe weight loss goal.  Do not use any products that contain nicotine or tobacco. These products include cigarettes, chewing tobacco, and vaping devices, such as e-cigarettes. These can make symptoms worse. If you need help quitting, ask your health care provider.  Wear loose-fitting clothing. Do not wear anything tight around your waist that causes pressure on your abdomen.  Raise (elevate) the head of your bed about 6 inches (15 cm) when you sleep. You can use a wedge to do this.  Try to reduce your stress, such as with yoga or meditation. If you need help reducing stress, ask your health care provider.      Medicines  Take over-the-counter and prescription medicines only as told by your health care provider.  Do not take aspirin or NSAIDs, such as ibuprofen, unless your health care provider told you to do so.  Stop medicines only as told by your health care provider. If you stop taking some medicines too quickly, your symptoms may get worse. General instructions  Pay attention to any changes in your symptoms.  Keep all follow-up visits. This is important. Contact a health care provider if:  You have new symptoms.  You have unexplained weight loss.  You have difficulty swallowing, or it hurts to swallow.  You have wheezing or a persistent cough.  Your symptoms do not improve with treatment.  You have frequent heartburn for more than 2 weeks. Get help right away if:  You suddenly have pain in your arms, neck, jaw, teeth, or back.  You suddenly feel sweaty, dizzy, or light-headed.  You have chest pain or shortness of breath.  You vomit and your vomit  looks like blood or coffee grounds.  Your stool is bloody or black. These symptoms may represent a serious problem that is an emergency. Do not wait to see if the symptoms will go away. Get medical help right away. Call your local emergency services (911 in the [place] Do not drive yourself to the hospital. Summary  Heartburn is a type of pain or discomfort that can happen in the throat or chest. It is often described as a burning pain. It may also cause a bad, acid-like taste in the mouth.  Avoid certain foods and drinks as told by your health care provider.  Take over-the-counter and prescription medicines only as told by your health care provider. Do not take aspirin or NSAIDs, such as ibuprofen, unless your health care provider told you to do so.  Contact a health care provider if your symptoms do not improve or they get worse. This information is not intended to replace advice given to you by your health care provider. Make sure you discuss any questions you have with your health care provider. Document Revised: 12/25/2019 Document Reviewed: 12/25/2019 Elsevier Patient Education  2021 [employer].   If you have lab work done today you will be contacted with your lab results within the next 2 weeks.  If you have not heard from [place] then please contact us. The fastest way to get your results is to register for My Chart.   IF you received an x-ray today, you will receive an invoice from [facility]. Please contact [facility] at [phone]  with questions or concerns regarding your invoice.   IF you received labwork today, you will receive an invoice from [facility]. Please contact [facility] at [phone] with questions or concerns regarding your invoice.   Our billing staff will not be able to assist you with questions regarding bills from these companies.  You will be contacted with the lab results as soon as they are available. The fastest way to get your results is  to activate your My Chart account. Instructions are located on the last page of this paperwork. If you have not heard from us regarding the results in 2 weeks, please contact this office.         Signed, Meredith StaggersJeffrey Greene, MD [facility]

## 2020-09-18 NOTE — Patient Instructions (Addendum)
Take famotidine twice per day every day.  See info on heartburn below. Follow up in 1 month if not improved or still needing medicine.   Tramadol if needed for pain, but I will refer you to orthopaedics for your hip pains and check updated xrays. Those can be done at [address].   Return to the clinic or go to the nearest emergency room if any of your symptoms worsen or new symptoms occur.   [email or web address]">  Heartburn Heartburn is a type of pain or discomfort that can happen in the throat or chest. It is often described as a burning pain. It may also cause a bad, acid-like taste in the mouth. Heartburn may feel worse when you lie down or bend over, and it is often worse at night. Heartburn may be caused by stomach contents that move back up into the esophagus (reflux). Follow these instructions at home: Eating and drinking  Avoid certain foods and drinks as told by your health care provider. This may include: ? Coffee and tea, with or without caffeine. ? Drinks that contain alcohol. ? Energy drinks and sports drinks. ? Carbonated drinks or sodas. ? Chocolate and cocoa. ? Peppermint and mint flavorings. ? Garlic and onions. ? Horseradish. ? Spicy and acidic foods, including peppers, chili powder, curry powder, vinegar, hot sauces, and barbecue sauce. ? Citrus fruit juices and citrus fruits, such as oranges, lemons, and limes. ? Tomato-based foods, such as red sauce, chili, salsa, and pizza with red sauce. ? Fried and fatty foods, such as donuts, french fries, potato chips, and high-fat dressings. ? High-fat meats, such as hot dogs and fatty cuts of red and white meats, such as rib eye steak, sausage, ham, and bacon. ? High-fat dairy items, such as whole milk, butter, and cream cheese.  Eat small, frequent meals instead of large meals.  Avoid drinking large amounts of liquid with your meals.  Avoid eating meals during the 2-3  hours before bedtime.  Avoid lying down right after you eat.  Do not exercise right after you eat.   Lifestyle  If you are overweight, reduce your weight to an amount that is healthy for you. Ask your health care provider for guidance about a safe weight loss goal.  Do not use any products that contain nicotine or tobacco. These products include cigarettes, chewing tobacco, and vaping devices, such as e-cigarettes. These can make symptoms worse. If you need help quitting, ask your health care provider.  Wear loose-fitting clothing. Do not wear anything tight around your waist that causes pressure on your abdomen.  Raise (elevate) the head of your bed about 6 inches (15 cm) when you sleep. You can use a wedge to do this.  Try to reduce your stress, such as with yoga or meditation. If you need help reducing stress, ask your health care provider.      Medicines  Take over-the-counter and prescription medicines only as told by your health care provider.  Do not take aspirin or NSAIDs, such as ibuprofen, unless your health care provider told you to do so.  Stop medicines only as told by your health care provider. If you stop taking some medicines too quickly, your symptoms may get worse. General instructions  Pay attention to any changes in your symptoms.  Keep all follow-up visits. This is important. Contact a health care provider if:  You have new symptoms.  You have unexplained weight loss.  You have difficulty swallowing, or it hurts to  swallow.  You have wheezing or a persistent cough.  Your symptoms do not improve with treatment.  You have frequent heartburn for more than 2 weeks. Get help right away if:  You suddenly have pain in your arms, neck, jaw, teeth, or back.  You suddenly feel sweaty, dizzy, or light-headed.  You have chest pain or shortness of breath.  You vomit and your vomit looks like blood or coffee grounds.  Your stool is bloody or black. These  symptoms may represent a serious problem that is an emergency. Do not wait to see if the symptoms will go away. Get medical help right away. Call your local emergency services (911 in the [place] Do not drive yourself to the hospital. Summary  Heartburn is a type of pain or discomfort that can happen in the throat or chest. It is often described as a burning pain. It may also cause a bad, acid-like taste in the mouth.  Avoid certain foods and drinks as told by your health care provider.  Take over-the-counter and prescription medicines only as told by your health care provider. Do not take aspirin or NSAIDs, such as ibuprofen, unless your health care provider told you to do so.  Contact a health care provider if your symptoms do not improve or they get worse. This information is not intended to replace advice given to you by your health care provider. Make sure you discuss any questions you have with your health care provider. Document Revised: 12/25/2019 Document Reviewed: 12/25/2019 Elsevier Patient Education  2021 [employer].   If you have lab work done today you will be contacted with your lab results within the next 2 weeks.  If you have not heard from [place] then please contact us. The fastest way to get your results is to register for My Chart.   IF you received an x-ray today, you will receive an invoice from [facility]. Please contact [facility] at [phone] with questions or concerns regarding your invoice.   IF you received labwork today, you will receive an invoice from [place]. Please contact [facility] at [phone] with questions or concerns regarding your invoice.   Our billing staff will not be able to assist you with questions regarding bills from these companies.  You will be contacted with the lab results as soon as they are available. The fastest way to get your results is to activate your My Chart account. Instructions are located on the last page  of this paperwork. If you have not heard from [place] regarding the results in 2 weeks, please contact this office.

## 2020-09-19 LAB — TSH: TSH: 1.72 u[IU]/mL (ref 0.450–4.500)

## 2020-09-19 LAB — SPECIMEN STATUS REPORT

## 2020-09-22 ENCOUNTER — Other Ambulatory Visit: Payer: Self-pay | Admitting: Family Medicine

## 2020-09-22 ENCOUNTER — Ambulatory Visit
Admission: RE | Admit: 2020-09-22 | Discharge: 2020-09-22 | Disposition: A | Discharge status: Home / Self Care | Payer: Medicare Other | Source: Ambulatory Visit | Attending: Family Medicine | Admitting: Family Medicine

## 2020-09-22 DIAGNOSIS — M25551 Pain in right hip: Secondary | ICD-10-CM

## 2020-09-22 DIAGNOSIS — M160 Bilateral primary osteoarthritis of hip: Secondary | ICD-10-CM | POA: Diagnosis not present

## 2020-09-22 DIAGNOSIS — M25552 Pain in left hip: Secondary | ICD-10-CM

## 2020-09-22 DIAGNOSIS — M25751 Osteophyte, right hip: Secondary | ICD-10-CM | POA: Diagnosis not present

## 2020-09-29 ENCOUNTER — Ambulatory Visit (INDEPENDENT_AMBULATORY_CARE_PROVIDER_SITE_OTHER): Payer: Medicare Other | Admitting: Orthopaedic Surgery

## 2020-09-29 ENCOUNTER — Encounter: Payer: Self-pay | Admitting: Orthopaedic Surgery

## 2020-09-29 ENCOUNTER — Ambulatory Visit (INDEPENDENT_AMBULATORY_CARE_PROVIDER_SITE_OTHER): Payer: Medicare Other

## 2020-09-29 DIAGNOSIS — M1612 Unilateral primary osteoarthritis, left hip: Secondary | ICD-10-CM

## 2020-09-29 DIAGNOSIS — M1611 Unilateral primary osteoarthritis, right hip: Secondary | ICD-10-CM | POA: Insufficient documentation

## 2020-09-29 NOTE — Progress Notes (Signed)
Office Visit Note   Patient: Kelly Mcdowell           Date of Birth: [birth year]           MRN: [id number] Visit Date: 09/29/2020              Requested by: [name], MD [address] [place],  [place] [address] PCP: [name], MD   Assessment & Plan: Visit Diagnoses:  1. Primary osteoarthritis of right hip   2. Primary osteoarthritis of left hip     Plan: My impression is end-stage right hip DJD mild left hip DJD.  We had discussion on treatment options to include cortisone injections and continued medical management versus a total hip replacement and their associated risks benefits rehab recovery.  Based on our discussion she has elected to proceed with a right total hip replacement in the near future.  We will get the necessary clearances from her PCP and cardiologist.  She will need to be off of Eliquis for 3 days preoperatively.  She will be able to resume Eliquis postoperative day 1.  She has a very strong social support system at home for postoperative care.  Follow-Up Instructions: Return if symptoms worsen or fail to improve.   Orders:  Orders Placed This Encounter  Procedures  . XR Lumbar Spine 2-3 Views   No orders of the defined types were placed in this encounter.     Procedures: No procedures performed   Clinical Data: No additional findings.   Subjective: Chief Complaint  Patient presents with  . Right Hip - Pain  . Left Hip - Pain    Kelly Mcdowell is a very pleasant 80 year old female here for evaluation of bilateral hip pain mainly the right.  Her right hip and groin pain started about 2 years ago in the left hip started hurting in January.  Denies any injuries.  She has been taking Tylenol and tramadol for the pain with only temporary relief.  She is not a candidate for NSAIDs as she is on Eliquis due to atrial fibrillation.  She is also engaged in a home exercise program without significant improvement in hip pain.  She notices  significant weakness in the right leg.  Denies any numbness and tingling.  She has groin and hip pain that radiates into the thigh.   Review of Systems  Constitutional: Negative.   HENT: Negative.   Eyes: Negative.   Respiratory: Negative.   Cardiovascular: Negative.   Endocrine: Negative.   Musculoskeletal: Negative.   Neurological: Negative.   Hematological: Negative.   Psychiatric/Behavioral: Negative.   All other systems reviewed and are negative.    Objective: Vital Signs: There were no vitals taken for this visit.  Physical Exam Vitals and nursing note reviewed.  Constitutional:      Appearance: She is well-developed.  HENT:     Head: Normocephalic and atraumatic.  Pulmonary:     Effort: Pulmonary effort is normal.  Abdominal:     Palpations: Abdomen is soft.  Musculoskeletal:     Cervical back: Neck supple.  Skin:    General: Skin is warm.     Capillary Refill: Capillary refill takes less than 2 seconds.  Neurological:     Mental Status: She is alert and oriented to person, place, and time.  Psychiatric:        Behavior: Behavior normal.        Thought Content: Thought content normal.  Judgment: Judgment normal.     Ortho Exam Right hip shows significant pain with internal and external rotation.  Positive Stinchfield sign.  Negative sciatic tension signs.  Lateral hip is mildly tender to palpation. Specialty Comments:  No specialty comments available.  Imaging: XR Lumbar Spine 2-3 Views  Result Date: 09/29/2020 Straightening of lumbar spine.  Lumbar spondylosis.  No acute abnormalities.    PMFS History: Patient Active Problem List   Diagnosis Date Noted  . Primary osteoarthritis of right hip 09/29/2020  . Primary osteoarthritis of left hip 09/29/2020  . Educated about COVID-19 virus infection 02/20/2020  . Atrial fibrillation (HCC) 02/20/2020  . 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 Hypothyroidism  12/15/2016  . Hip joint pain 09/16/2013  . 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 Hypothyroidism (acquired) 06/18/2012   Past Medical History:  Diagnosis Date  . Allergy   . Arthritis   . Cataract    both eyes  . 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 Thyroid disease     Family History  Problem Relation Age of Onset  . Congestive Heart Failure Father   . Cancer Sister        breast  . Breast cancer Sister   . Colon polyps Sister   . Cancer Maternal Grandmother   . Breast cancer Maternal Aunt   . Stomach cancer Maternal Aunt   . Colon cancer Neg Hx   . Esophageal cancer Neg Hx   . Rectal cancer Neg Hx     Past Surgical History:  Procedure Laterality Date  . CESAREAN SECTION    . TUBAL LIGATION     Social History   Occupational History  . Not on file  Tobacco Use  . Smoking status: Former Smoker    Quit date: 03/04/2005    Years since quitting: 15.5  . Smokeless tobacco: Never Used  Vaping Use  . Vaping Use: Never used  Substance and Sexual Activity  . Alcohol use: No  . Drug use: No  . Sexual activity: Never    Birth control/protection: Surgical

## 2020-10-06 ENCOUNTER — Telehealth: Payer: Self-pay | Admitting: *Deleted

## 2020-10-06 NOTE — Telephone Encounter (Signed)
   [facility] Pre-operative Risk Assessment    Patient Name: Kelly Mcdowell  DOB: Sep 27, 1940  MRN: [id number]    Request for surgical clearance:  1. What type of surgery is being performed?  Right total hip arthroplasty  2. When is this surgery scheduled? TBD  3. What type of clearance is required (medical clearance vs. Pharmacy clearance to hold med vs. Both)? both  4. Are there any medications that need to be held prior to surgery and how long?Eliquis  5. Practice name and name of physician performing surgery?  [facility]; Dr Marianna Payment   6. What is the office phone number?  [phone]   7.   What is the office fax number?  [phone] attn [name]  8.   Anesthesia type (None, local, MAC, general) ? spinal   Raiford Simmonds 10/06/2020, 5:33 PM  _________________________________________________________________   (provider comments below)

## 2020-10-07 NOTE — Telephone Encounter (Signed)
   Patient Name: Kelly Mcdowell  DOB: 1940/08/14  MRN: [id number]   Primary Cardiologist: Rollene Rotunda, MD  Chart reviewed as part of pre-operative protocol coverage. Patient has not been seen within the last 6 months. Because of [name]'s past medical history and time since last visit, she will require a follow-up visit in order to better assess preoperative cardiovascular risk.  Pre-op covering staff: - Please schedule appointment and call patient to inform them. If patient already had an upcoming appointment within acceptable timeframe, please add "pre-op clearance" to the appointment notes so provider is aware. - Please contact requesting surgeon's office via preferred method (i.e, phone, fax) to inform them of need for appointment prior to surgery.  I will go ahead and route this message to pharmacy pool for input on holding Eliquis as requested below so that this information is available to the clearing provider at time of patient's appointment.   Corrin Parker, PA-C  10/07/2020, 7:50 AM

## 2020-10-07 NOTE — Telephone Encounter (Signed)
Patient with diagnosis of afib on Eliquis for anticoagulation.    Procedure: right THA Date of procedure: TBD  CHA2DS2-VASc Score = 3  This indicates a 3.2% annual risk of stroke. The patient's score is based upon: CHF History: No HTN History: No Diabetes History: No Stroke History: No Vascular Disease History: No Age Score: 2 Gender Score: 1  CrCl 76mL/min Platelet count 289K  Per office protocol, patient can hold Eliquis for 3 days prior to procedure.

## 2020-10-09 ENCOUNTER — Telehealth: Payer: Self-pay | Admitting: Orthopaedic Surgery

## 2020-10-09 NOTE — Telephone Encounter (Signed)
Called and left a detailed voice message for the patient to give our office a call back to get scheduled for a pre-op appointment and/or I will be trying to contact her again before end of business day. I also tried calling the number listed for her home number and the answering machine cut me short while leaving a voice message then asking to input pin to hear message associated with the number I dialed.

## 2020-10-09 NOTE — Telephone Encounter (Signed)
Got in contact with the patient and informed her the reason for my call. Patient is now scheduled to see Dr. Antoine Poche on 11/04/20 at 4:20 PM. Patient verbalized understanding and all (f any) questions were answered. Patient was also informed that Dr. Warren Danes office will receive a call and/or fax with the information as well. She thanked me for calling.

## 2020-10-09 NOTE — Telephone Encounter (Signed)
Delice Bison from [facility] called with pt upcoming appt for pre op clearance May 5 @ 4:20 pm with Dr. Antoine Poche. If any questions call Delice Bison at [phone] she will be in office til 4:30 pm today

## 2020-10-09 NOTE — Telephone Encounter (Signed)
Called left a message for [name] with the details of patient's appointment date and time. Will also fax the clearance to the office.

## 2020-11-04 ENCOUNTER — Other Ambulatory Visit: Payer: Self-pay

## 2020-11-04 ENCOUNTER — Ambulatory Visit: Payer: Medicare Other | Admitting: Cardiology

## 2020-11-04 ENCOUNTER — Ambulatory Visit (INDEPENDENT_AMBULATORY_CARE_PROVIDER_SITE_OTHER): Payer: Medicare Other | Admitting: Cardiology

## 2020-11-04 ENCOUNTER — Encounter: Payer: Self-pay | Admitting: Cardiology

## 2020-11-04 VITALS — BP 140/80 | HR 68 | Ht 62.0 in | Wt 124.0 lb

## 2020-11-04 DIAGNOSIS — I4891 Unspecified atrial fibrillation: Secondary | ICD-10-CM

## 2020-11-04 NOTE — Patient Instructions (Signed)

## 2020-11-04 NOTE — Progress Notes (Signed)
Cardiology Office Note   Date:  11/04/2020   ID:  Ravneet, Spilker [birth year], MRN [id number]  PCP:  [name], MD  Cardiologist:   Rollene Rotunda, MD Referring:  [name], MD  Chief Complaint  Patient presents with  . Atrial Fibrillation      History of Present Illness: Kelly Mcdowell is a 80 y.o. female who presents for evaluation of atrial fibrillation.  I sent her for an echo and she had normal LV function with mild AI.   She has her hypothyroidism followed by [name], MD but was found to have recently a low TSH and mildly elevated free T4.   Since I last saw her she has some palpitations at night but nothing that is really necessarily the atrial fibrillation and she has no morning when I first met her.  She has not had any presyncope or syncope.  She is limited by some hip pain and actually needs surgery.  She still able to do her chores and has a good functional level without chest pressure, neck or arm discomfort.  He has no shortness of breath, PND or orthopnea.  She has no weight gain or edema.  Past Medical History:  Diagnosis Date  . Allergy   . Arthritis   . Cataract    both eyes  . 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 Thyroid disease     Past Surgical History:  Procedure Laterality Date  . CESAREAN SECTION    . TUBAL LIGATION       Current Outpatient Medications  Medication Sig Dispense Refill  . apixaban (ELIQUIS) 5 MG TABS tablet Take 1 tablet (5 mg total) by mouth 2 (two) times daily. 60 tablet 5  . Calcium Carb-Cholecalciferol (CALCIUM-VITAMIN D) 500-200 MG-UNIT tablet Take 1 tablet by mouth 2 (two) times daily. 2 tablets once daily/// d3 1000    . diclofenac Sodium (VOLTAREN) 1 % GEL Apply 4 g topically 4 (four) times daily. 100 g 1  . famotidine (PEPCID) 10 MG tablet Take 1 tablet (10 mg total) by mouth 2 (two) times daily. 60 tablet 1  . GENERIC EXTERNAL MEDICATION 1 tablet daily as needed. As  needed for diarrhea/OTC antidiarrheal medication    . hydrOXYzine (ATARAX/VISTARIL) 25 MG tablet Take 1/2 tablet to one whole tablet by mouth daily as needed for itching 30 tablet 1  . ibuprofen (ADVIL,MOTRIN) 400 MG tablet Take 400 mg by mouth every 6 (six) hours as needed.     Marland Kitchen levothyroxine (SYNTHROID) 88 MCG tablet Take 1 tablet (88 mcg total) by mouth daily before breakfast. 4 days per week - alternating with 1/2 tablet on other 3 days. 90 tablet 2   No current facility-administered medications for this visit.    Allergies:   Patient has no known allergies.    ROS:  Please see the history of present illness.   Otherwise, review of systems are positive for mild ankle edema.   All other systems are reviewed and negative.    PHYSICAL EXAM: VS:  BP 140/80 (BP Location: Left Arm, Patient Position: Sitting, Cuff Size: Normal)   Pulse 68   Ht 5\' 2"  (1.575 m)   Wt 124 lb (56.2 kg)   BMI 22.68 kg/m  , BMI Body mass index is 22.68 kg/m. GENERAL:  Well appearing NECK:  No jugular venous distention, waveform within normal limits, carotid upstroke brisk and symmetric, no bruits, no thyromegaly LUNGS:  Clear to auscultation bilaterally CHEST:  Unremarkable HEART:  PMI not displaced or sustained,S1 and S2 within normal limits, no S3, no S4, no clicks, no rubs, no murmurs ABD:  Flat, positive bowel sounds normal in frequency in pitch, no bruits, no rebound, no guarding, no midline pulsatile mass, no hepatomegaly, no splenomegaly EXT:  2 plus pulses throughout, no edema, no cyanosis no clubbing   EKG:  EKG is ordered today. Sinus rhythm, rate 68, premature atrial contractions, no acute ST-T wave changes.   Recent Labs: 11/20/2019: Hemoglobin 14.5; Platelets 289 08/24/2020: ALT 20; BUN 15; Creatinine, Ser 0.90; Potassium 4.3; Sodium 145; TSH 1.720    Lipid Panel    Component Value Date/Time   CHOL 271 (H) 05/09/2017 1202   TRIG 64 05/09/2017 1202   HDL 70 05/09/2017 1202   CHOLHDL 3.9  05/09/2017 1202   LDLCALC 188 (H) 05/09/2017 1202      Wt Readings from Last 3 Encounters:  11/04/20 124 lb (56.2 kg)  09/18/20 122 lb (55.3 kg)  08/28/20 118 lb 3.2 oz (53.6 kg)    Take care  Other studies Reviewed: Additional studies/ records that were reviewed today include: Labs Review of the above records demonstrates:  Please see elsewhere in the note.     ASSESSMENT AND PLAN:  PREOP: The patient is at acceptable risk for the planned procedure.  She has a high functional level.  It is not a high risk procedure.  She has no unstable findings or symptoms.  According to ACC/AHA guidelines no further testing is indicated.  ATRIAL FIB:   The patient had paroxysmal atrial fibrillation.  Her CHA2DS2-VASc is 3.  She tolerates anticoagulation.  This can be held as needed for the surgery and we discussed this.  HYPOTHYROIDISM: This is being regulated by Wendie Agreste, MD.  Her thyroid was normal recently  AI:   This was found on the echo.  I will follow this clinically.  I will check an echo 2 - 3 years.    Current medicines are reviewed at length with the patient today.  The patient does not have concerns regarding medicines.  The following changes have been made:  None  Labs/ tests ordered today include:  None Orders Placed This Encounter  Procedures  . EKG 12-Lead     Disposition:   FU with me in 12 months.   Signed, Minus Breeding, MD  11/04/2020 2:17 PM    [place]

## 2020-11-06 ENCOUNTER — Telehealth: Payer: Self-pay

## 2020-11-06 NOTE — Telephone Encounter (Signed)
Patient called in stating that Kelly Mcdowell is needing approval from Dr.Greene that it is okay to have hip surgery in June. [name] is wondering if Dr.Greene can call Linde Gillis at [phone].

## 2020-11-06 NOTE — Telephone Encounter (Signed)
Called [facility] and spoke [name] with Dr. Donaciano Eva office. She states she will fax over clearance.

## 2020-11-06 NOTE — Telephone Encounter (Signed)
Received surgical clearance request from [facility] for total hip replacement.   For [name] to sign off pt must have an in office pre op clearance.

## 2020-11-12 ENCOUNTER — Other Ambulatory Visit: Payer: Self-pay

## 2020-11-12 ENCOUNTER — Ambulatory Visit (INDEPENDENT_AMBULATORY_CARE_PROVIDER_SITE_OTHER): Payer: Medicare Other | Admitting: Family Medicine

## 2020-11-12 ENCOUNTER — Encounter: Payer: Self-pay | Admitting: Family Medicine

## 2020-11-12 VITALS — BP 122/70 | HR 54 | Temp 98.2°F | Resp 17 | Ht 62.0 in | Wt 123.8 lb

## 2020-11-12 DIAGNOSIS — M25552 Pain in left hip: Secondary | ICD-10-CM | POA: Diagnosis not present

## 2020-11-12 DIAGNOSIS — I4891 Unspecified atrial fibrillation: Secondary | ICD-10-CM

## 2020-11-12 DIAGNOSIS — Z01818 Encounter for other preprocedural examination: Secondary | ICD-10-CM | POA: Diagnosis not present

## 2020-11-12 DIAGNOSIS — E039 Hypothyroidism, unspecified: Secondary | ICD-10-CM | POA: Diagnosis not present

## 2020-11-12 DIAGNOSIS — M25551 Pain in right hip: Secondary | ICD-10-CM | POA: Diagnosis not present

## 2020-11-12 NOTE — Patient Instructions (Signed)
Keep appointment for preop labs and xray. I did not order any labs today. I do not see any medical contraindication to your planned surgery.  Good luck and let me know if there are questions.

## 2020-11-12 NOTE — Progress Notes (Signed)
Subjective:  Patient ID: Kelly Mcdowell, female    DOB: [birth year]  Age: 80 y.o. MRN: [id number]  CC:  Chief Complaint  Patient presents with  . Medical Clearance    Pt her for clearance for surgery 6/13 hip replacment, pt has been cleared by cardiology   . Form Completion    Pt requesting handicap placard today due to limited mobility     HPI Kelly Mcdowell presents for   Preoperative evaluation for hip surgery. Plan for surgery on June 13, Dr. Roda Shutters, right total hip arthroplasty. History of atrial fibrillation, followed by Dr. Antoine Poche.  Note reviewed from May 4.  Able to do chores, good functional level without chest pressure neck or arm discomfort.  No dyspnea PND orthopnea.  Deemed to be acceptable risk for planned procedure with high functional level.  Not high risk procedure.  No unstable findings or symptoms and no further testing indicated based on [facility] AHA guidelines.  Also discussed holding her anticoagulation (Eliquis) perioperatively for a few days for the atrial fibrillation, paroxysmal atrial fibrillation with CHA2DS2-VASc score of 3.  Plan for echo every 2 to 3 years for aortic insufficiency. If afib occurs during surgery  Preop lab orders noted including urinalysis, PT, INR, PTT, CMP, CBC, type and screen.CXR also ordered. preop appt on 6/9. covid test on 6/10. Stopping Eliquis on 6/10.   No recent dysuria or new urinary symptoms.  No new cough/dyspnea. Some pnd at times. No recent nasal spray.   Hypothyroidism: Lab Results  Component Value Date   TSH 1.720 08/24/2020  Stable on testing in February. Lab Results  Component Value Date   CREATININE 0.90 08/24/2020       History Patient Active Problem List   Diagnosis Date Noted  . Primary osteoarthritis of right hip 09/29/2020  . Primary osteoarthritis of left hip 09/29/2020  . Educated about COVID-19 virus infection 02/20/2020  . Atrial fibrillation (HCC) 02/20/2020  . Age-related osteoporosis without  current pathological fracture 03/04/2019  . Hypothyroidism 12/15/2016  . Hip joint pain 09/16/2013  . 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 Hypothyroidism (acquired) 06/18/2012   Past Medical History:  Diagnosis Date  . Allergy   . Arthritis   . Cataract    both eyes  . 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 Thyroid disease    Past Surgical History:  Procedure Laterality Date  . CESAREAN SECTION    . TUBAL LIGATION     No Known Allergies Prior to Admission medications   Medication Sig Start Date End Date Taking? Authorizing Provider  apixaban (ELIQUIS) 5 MG TABS tablet Take 1 tablet (5 mg total) by mouth 2 (two) times daily. 08/13/20  Yes Rollene Rotunda, MD  Calcium Carb-Cholecalciferol (CALCIUM-VITAMIN D) 500-200 MG-UNIT tablet Take 1 tablet by mouth 2 (two) times daily. 2 tablets once daily/// d3 1000   Yes [provider]  diclofenac Sodium (VOLTAREN) 1 % GEL Apply 4 g topically 4 (four) times daily. 08/28/20  Yes [name], MD  famotidine (PEPCID) 10 MG tablet Take 1 tablet (10 mg total) by mouth 2 (two) times daily. 09/18/20  Yes [name], MD  GENERIC EXTERNAL MEDICATION 1 tablet daily as needed. As needed for diarrhea/OTC antidiarrheal medication   Yes [provider]  hydrOXYzine (ATARAX/VISTARIL) 25 MG tablet Take 1/2 tablet to one whole tablet by mouth daily as needed for itching 05/30/17  Yes [name], MD  ibuprofen (ADVIL,MOTRIN) 400 MG tablet Take 400 mg by mouth every 6 (six) hours as needed.    Yes [provider]  levothyroxine (SYNTHROID) 88 MCG tablet Take 1 tablet (88 mcg total) by mouth daily before breakfast. 4 days per week - alternating with 1/2 tablet on other 3 days. 08/28/20  Yes [name], MD   Social History   Socioeconomic History  . Marital status: Married    Spouse name: Not on file  . Number of children: Not on file  . Years of education: Not on file   . Highest education level: Not on file  Occupational History  . Not on file  Tobacco Use  . Smoking status: Former Smoker    Quit date: 03/04/2005    Years since quitting: 15.7  . Smokeless tobacco: Never Used  Vaping Use  . Vaping Use: Never used  Substance and Sexual Activity  . Alcohol use: No  . Drug use: No  . Sexual activity: Never    Birth control/protection: Surgical  Other Topics Concern  . Not on file  Social History Narrative   Married and lives with husband.  Three daughters.  12 grands.     Social Determinants of Health   Financial Resource Strain: Not on file  Food Insecurity: Not on file  Transportation Needs: Not on file  Physical Activity: Not on file  Stress: Not on file  Social Connections: Not on file  Intimate Partner Violence: Not on file    Review of Systems Per HPI. No CP/dyspnea.   Objective:   Vitals:   11/12/20 1116  BP: 122/70  Pulse: (!) 54  Resp: 17  Temp: 98.2 F (36.8 C)  TempSrc: Temporal  SpO2: 98%  Weight: 123 lb 12.8 oz (56.2 kg)  Height: 5\' 2"  (1.575 m)     Physical Exam Vitals reviewed.  Constitutional:      Appearance: She is well-developed.  HENT:     Head: Normocephalic and atraumatic.  Eyes:     Conjunctiva/sclera: Conjunctivae normal.     Pupils: Pupils are equal, round, and reactive to light.  Neck:     Vascular: No carotid bruit.  Cardiovascular:     Rate and Rhythm: Normal rate and regular rhythm.     Heart sounds: Normal heart sounds. No gallop.   Pulmonary:     Effort: Pulmonary effort is normal.     Breath sounds: Normal breath sounds.  Abdominal:     Palpations: Abdomen is soft. There is no pulsatile mass.     Tenderness: There is no abdominal tenderness.  Skin:    General: Skin is warm and dry.  Neurological:     Mental Status: She is alert and oriented to person, place, and time.  Psychiatric:        Mood and Affect: Mood normal.        Behavior: Behavior normal.    32 minutes spent  during visit, greater than 50% counseling and assimilation of information, chart review, and discussion of plan.    Assessment & Plan:  Kelly Mcdowell is a 80 y.o. female . Preop examination  Atrial fibrillation, unspecified type (HCC)  Bilateral hip pain  Hypothyroidism, unspecified type  Appears to be at acceptable medical risk for planned procedure.  Upcoming lab work planned including CBC, CMP, chest x-ray and urine testing.  Agree with these labs, will hold on specific labs at my office.  Timing of holding anticoagulation to be discussed with cardiology.  34-month  follow-up planned for hypothyroidism follow-up, med follow-up and labs.  No orders of the defined types were placed in this encounter.  Patient Instructions  Keep appointment for preop labs and xray. I did not order any labs today. I do not see any medical contraindication to your planned surgery.  Good luck and let me know if there are questions.      Signed, Meredith Staggers, MD [facility]

## 2020-12-02 ENCOUNTER — Other Ambulatory Visit: Payer: Self-pay

## 2020-12-07 ENCOUNTER — Other Ambulatory Visit: Payer: Self-pay | Admitting: Physician Assistant

## 2020-12-07 MED ORDER — ONDANSETRON HCL 4 MG PO TABS: 4.0000 mg | ORAL_TABLET | Freq: Three times a day (TID) | ORAL | 0 refills | Status: DC | PRN

## 2020-12-07 MED ORDER — METHOCARBAMOL 500 MG PO TABS: 500.0000 mg | ORAL_TABLET | Freq: Two times a day (BID) | ORAL | 0 refills | Status: DC | PRN

## 2020-12-07 MED ORDER — OXYCODONE-ACETAMINOPHEN 5-325 MG PO TABS: 1.0000 | ORAL_TABLET | Freq: Four times a day (QID) | ORAL | 0 refills | Status: DC | PRN

## 2020-12-07 MED ORDER — DOCUSATE SODIUM 100 MG PO CAPS: 100.0000 mg | ORAL_CAPSULE | Freq: Every day | ORAL | 2 refills | Status: DC | PRN

## 2020-12-08 NOTE — Pre-Procedure Instructions (Signed)
Surgical Instructions:    Your procedure is scheduled on Monday, June 13th (12:33 PM- 2:28 PM).  Report to [facility] Main Entrance "A" at 10:30 A.M., then check in with the Admitting office.  Call this number if you have any questions prior to, or have any problems the morning of surgery:  [phone]    Remember:  Do not eat after midnight the night before your surgery.  You may drink clear liquids until 09:30 AM the morning of your surgery.   Clear liquids allowed are: Water, Non-Citrus Juices (without pulp), Carbonated Beverages, Clear Tea, Black Coffee Only, and Gatorade.   Enhanced Recovery after Surgery for Orthopedics Enhanced Recovery after Surgery is a protocol used to improve the stress on your body and your recovery after surgery.  Patient Instructions  . The day of surgery (if you do NOT have diabetes):  o Drink ONE (1) Pre-Surgery Clear Ensure by 09:30 AM the morning of surgery.   o This drink was given to you during your hospital pre-op appointment visit. o Nothing else to drink after completing the Pre-Surgery Clear Ensure.          If you have questions, please contact your surgeon's office.      Take these medicines the morning of surgery with A SIP OF WATER: levothyroxine (SYNTHROID)  IF NEEDED: famotidine (PEPCID) ondansetron [facility])    >>Per your doctor's instructions, stop apixaban Everlene Balls) Friday 12/11/20 <<    As of today, STOP taking any Aspirin (unless otherwise instructed by your surgeon) Aleve, Naproxen, Ibuprofen, Motrin, Advil, Goody's, BC's, all herbal medications, fish oil, and all vitamins.              Special instructions:   [facility]- Preparing For Surgery  Before surgery, you can play an important role. Because skin is not sterile, your skin needs to be as free of germs as possible. You can reduce the number of germs on your skin by washing with CHG (chlorahexidine gluconate) Soap before surgery.  CHG is an antiseptic  cleaner which kills germs and bonds with the skin to continue killing germs even after washing.    Oral Hygiene is also important to reduce your risk of infection.  Remember - BRUSH YOUR TEETH THE MORNING OF SURGERY WITH YOUR REGULAR TOOTHPASTE  Please do not use if you have an allergy to CHG or antibacterial soaps. If your skin becomes reddened/irritated stop using the CHG.  Do not shave (including legs and underarms) for at least 48 hours prior to first CHG shower. It is OK to shave your face.  Please follow these instructions carefully.   1. Shower the NIGHT BEFORE SURGERY and the MORNING OF SURGERY  2. If you chose to wash your hair, wash your hair first as usual with your normal shampoo.  3. After you shampoo, rinse your hair and body thoroughly to remove the shampoo.  4. Wash Face and genitals (private parts) with your normal soap.   5. Use CHG Soap as you would any other liquid soap. You can apply CHG directly to the skin and wash gently with a scrungie or a clean washcloth.   6. Apply the CHG Soap to your body ONLY FROM THE NECK DOWN.  Do not use on open wounds or open sores. Avoid contact with your eyes, ears, mouth and genitals (private parts). Wash Face and genitals (private parts)  with your normal soap.   7. Wash thoroughly, paying special attention to the area where your surgery will  be performed.  8. Thoroughly rinse your body with warm water from the neck down.  9. DO NOT shower/wash with your normal soap after using and rinsing off the CHG Soap.  10. Pat yourself dry with a CLEAN TOWEL.  11. Wear CLEAN PAJAMAS to bed the night before surgery.  12. Place CLEAN SHEETS on your bed the night before your surgery.  13. DO NOT SLEEP WITH PETS.   Day of Surgery: SHOWER with CHG soap. Brush your teeth WITH YOUR REGULAR TOOTHPASTE. Wear Clean/Comfortable clothing the morning of surgery. Do not apply any deodorants/lotions.   Do not wear jewelry, make up, or nail  polish. Do not shave 48 hours prior to surgery.   Do not bring valuables to the hospital. [facility] is not responsible for any belongings or valuables.   Do NOT Smoke (Tobacco/Vaping) or drink Alcohol 24 hours prior to your procedure.  If you use a CPAP at night, you may bring all equipment for your overnight stay.   Contacts, glasses, or dentures may not be worn into surgery, please bring cases for these belongings.   For patients admitted to the hospital, discharge time will be determined by your treatment team.   Patients discharged the day of surgery will not be allowed to drive home, and someone needs to stay with them for 24 hours.    Please read over the following fact sheets that you were given.

## 2020-12-09 ENCOUNTER — Ambulatory Visit (HOSPITAL_COMMUNITY)
Admission: RE | Admit: 2020-12-09 | Discharge: 2020-12-09 | Disposition: A | Discharge status: Home / Self Care | Payer: Medicare Other | Source: Ambulatory Visit | Attending: Physician Assistant | Admitting: Physician Assistant

## 2020-12-09 ENCOUNTER — Encounter (HOSPITAL_COMMUNITY)
Admission: RE | Admit: 2020-12-09 | Discharge: 2020-12-09 | Disposition: A | Discharge status: Home / Self Care | Payer: Medicare Other | Source: Ambulatory Visit | Attending: Orthopaedic Surgery | Admitting: Orthopaedic Surgery

## 2020-12-09 ENCOUNTER — Other Ambulatory Visit: Payer: Self-pay

## 2020-12-09 ENCOUNTER — Encounter (HOSPITAL_COMMUNITY): Payer: Self-pay

## 2020-12-09 DIAGNOSIS — Z7901 Long term (current) use of anticoagulants: Secondary | ICD-10-CM | POA: Diagnosis not present

## 2020-12-09 DIAGNOSIS — I480 Paroxysmal atrial fibrillation: Secondary | ICD-10-CM | POA: Diagnosis not present

## 2020-12-09 DIAGNOSIS — M1611 Unilateral primary osteoarthritis, right hip: Secondary | ICD-10-CM | POA: Diagnosis present

## 2020-12-09 DIAGNOSIS — Z01818 Encounter for other preprocedural examination: Secondary | ICD-10-CM | POA: Diagnosis not present

## 2020-12-09 HISTORY — DX: Cardiac arrhythmia, unspecified: I49.9

## 2020-12-09 HISTORY — DX: Endocarditis, valve unspecified: I38

## 2020-12-09 HISTORY — DX: Urinary tract infection, site not specified: N39.0

## 2020-12-09 HISTORY — DX: Hypothyroidism, unspecified: E03.9

## 2020-12-09 LAB — URINALYSIS, ROUTINE W REFLEX MICROSCOPIC
Bilirubin Urine: NEGATIVE
Glucose, UA: NEGATIVE mg/dL
Hgb urine dipstick: NEGATIVE
Ketones, ur: NEGATIVE mg/dL
Nitrite: NEGATIVE
Protein, ur: NEGATIVE mg/dL
Specific Gravity, Urine: 1.019 (ref 1.005–1.030)
pH: 6 (ref 5.0–8.0)

## 2020-12-09 LAB — CBC WITH DIFFERENTIAL/PLATELET
Abs Immature Granulocytes: 0.03 10*3/uL (ref 0.00–0.07)
Basophils Absolute: 0.1 10*3/uL (ref 0.0–0.1)
Basophils Relative: 1 %
Eosinophils Absolute: 0.6 10*3/uL — ABNORMAL HIGH (ref 0.0–0.5)
Eosinophils Relative: 9 %
HCT: 46.5 % — ABNORMAL HIGH (ref 36.0–46.0)
Hemoglobin: 14.7 g/dL (ref 12.0–15.0)
Immature Granulocytes: 1 %
Lymphocytes Relative: 30 %
Lymphs Abs: 1.9 10*3/uL (ref 0.7–4.0)
MCH: 30.3 pg (ref 26.0–34.0)
MCHC: 31.6 g/dL (ref 30.0–36.0)
MCV: 95.9 fL (ref 80.0–100.0)
Monocytes Absolute: 0.5 10*3/uL (ref 0.1–1.0)
Monocytes Relative: 8 %
Neutro Abs: 3.3 10*3/uL (ref 1.7–7.7)
Neutrophils Relative %: 51 %
Platelets: 265 10*3/uL (ref 150–400)
RBC: 4.85 MIL/uL (ref 3.87–5.11)
RDW: 12.9 % (ref 11.5–15.5)
WBC: 6.3 10*3/uL (ref 4.0–10.5)
nRBC: 0 % (ref 0.0–0.2)

## 2020-12-09 LAB — APTT: aPTT: 30 seconds (ref 24–36)

## 2020-12-09 LAB — COMPREHENSIVE METABOLIC PANEL
ALT: 14 U/L (ref 0–44)
AST: 21 U/L (ref 15–41)
Albumin: 4 g/dL (ref 3.5–5.0)
Alkaline Phosphatase: 85 U/L (ref 38–126)
Anion gap: 9 (ref 5–15)
BUN: 19 mg/dL (ref 8–23)
CO2: 27 mmol/L (ref 22–32)
Calcium: 9.1 mg/dL (ref 8.9–10.3)
Chloride: 107 mmol/L (ref 98–111)
Creatinine, Ser: 0.82 mg/dL (ref 0.44–1.00)
GFR, Estimated: >60 mL/min (ref >60)
Glucose, Bld: 94 mg/dL (ref 70–99)
Potassium: 3.6 mmol/L (ref 3.5–5.1)
Sodium: 143 mmol/L (ref 135–145)
Total Bilirubin: 0.7 mg/dL (ref 0.3–1.2)
Total Protein: 7.1 g/dL (ref 6.5–8.1)

## 2020-12-09 LAB — TYPE AND SCREEN
ABO/RH(D): O POS
Antibody Screen: NEGATIVE

## 2020-12-09 LAB — PROTIME-INR
INR: 1 (ref 0.8–1.2)
Prothrombin Time: 13.4 seconds (ref 11.4–15.2)

## 2020-12-09 LAB — SURGICAL PCR SCREEN
MRSA, PCR: NEGATIVE
Staphylococcus aureus: NEGATIVE

## 2020-12-09 NOTE — Progress Notes (Signed)
PCP - Asencion Partridge. Neva Seat, MD Cardiologist - Rollene Rotunda, MD  PPM/ICD - Denies  Chest x-ray - 12/09/20 EKG - 12/09/20 Stress Test - Denies ECHO - 02/09/20 Cardiac Cath - Denies  Sleep Study - Denies  Patient denies being diabetic.  Blood Thinner Instructions: Per MD's instructions, stop Eliquis 12/11/20 Aspirin Instructions: N/A  ERAS Protcol - Yes PRE-SURGERY Ensure or G2- Ensure given  COVID TEST- 12/10/20   Anesthesia review: Yes, cardiac hx; medical hx in Epic 11/12/20.  Patient denies shortness of breath, fever, cough and chest pain at PAT appointment   All instructions explained to the patient, with a verbal understanding of the material. Patient agrees to go over the instructions while at home for a better understanding. Patient also instructed to self quarantine after being tested for COVID-19. The opportunity to ask questions was provided.

## 2020-12-10 ENCOUNTER — Other Ambulatory Visit (HOSPITAL_COMMUNITY)
Admission: RE | Admit: 2020-12-10 | Discharge: 2020-12-10 | Disposition: A | Discharge status: Home / Self Care | Payer: Medicare Other | Source: Ambulatory Visit | Attending: Orthopaedic Surgery | Admitting: Orthopaedic Surgery

## 2020-12-10 DIAGNOSIS — Z20822 Contact with and (suspected) exposure to covid-19: Secondary | ICD-10-CM | POA: Diagnosis not present

## 2020-12-10 DIAGNOSIS — Z01812 Encounter for preprocedural laboratory examination: Secondary | ICD-10-CM | POA: Insufficient documentation

## 2020-12-10 LAB — SARS CORONAVIRUS 2 (TAT 6-24 HRS): SARS Coronavirus 2: NEGATIVE

## 2020-12-10 NOTE — Progress Notes (Signed)
Anesthesia Chart Review:  Follows with cardiology for hx of paroxysmal afib maintained on eliquis. Last seen by Dr. Antoine Poche 11/04/20 and had preop eval at that time. Per note, "PREOP: The patient is at acceptable risk for the planned procedure.  She has a high functional level.  It is not a high risk procedure.  She has no unstable findings or symptoms.  According to ACC/AHA guidelines no further testing is indicated. ATRIAL FIB:   The patient had paroxysmal atrial fibrillation.  Her CHA2DS2-VASc is 3.  She tolerates anticoagulation.  This can be held as needed for the surgery and we discussed this"  Seen by PCP Dr. Meredith Staggers 11/12/20 and noted to be medically optimized for surgery.   Preop labs reviewed, unremarkable.   EKG 12/09/20: Sinus rhythm with Premature atrial complexes. Rate 70. Possible Anterior infarct , age undetermined. No significant change from previous.  TTE 12/10/19:  1. Left ventricular ejection fraction, by estimation, is 60 to 65%. The  left ventricle has normal function. The left ventricle has no regional  wall motion abnormalities. Left ventricular diastolic parameters are  consistent with Grade I diastolic  dysfunction (impaired relaxation).   2. Right ventricular systolic function is normal. The right ventricular  size is normal. There is normal pulmonary artery systolic pressure.   3. The mitral valve is normal in structure. Trivial mitral valve  regurgitation. No evidence of mitral stenosis.   4. The aortic valve is tricuspid. Aortic valve regurgitation is mild to  moderate. No aortic stenosis is present. Aortic regurgitation PHT measures  588 msec.   5. Aortic dilatation noted. There is mild dilatation at the level of the  sinuses of Valsalva measuring 39 mm.   6. The inferior vena cava is normal in size with greater than 50%  respiratory variability, suggesting right atrial pressure of 3 mmHg.  Zannie Cove [facility] Short Stay Center/Anesthesiology Phone  [phone] 12/10/2020 8:31 AM

## 2020-12-10 NOTE — Anesthesia Preprocedure Evaluation (Addendum)
Anesthesia Evaluation  Patient identified by MRN, date of birth, ID band Patient awake    Reviewed: Allergy & Precautions, NPO status , Patient's Chart, lab work & pertinent test results  Airway Mallampati: II  TM Distance: >3 FB Neck ROM: Full    Dental no notable dental hx. (+) Teeth Intact, Dental Advisory Given, Implants,    Pulmonary former smoker,    Pulmonary exam normal breath sounds clear to auscultation       Cardiovascular Exercise Tolerance: Good Normal cardiovascular exam+ dysrhythmias Atrial Fibrillation  Rhythm:Regular Rate:Normal     Neuro/Psych negative neurological ROS  negative psych ROS   GI/Hepatic negative GI ROS, Neg liver ROS,   Endo/Other  Hypothyroidism   Renal/GU      Musculoskeletal  (+) Arthritis ,   Abdominal   Peds  Hematology Lab Results      Component                Value               Date                      WBC                      6.3                 12/09/2020                HGB                      14.7                12/09/2020                HCT                      46.5 (H)            12/09/2020                MCV                      95.9                12/09/2020                PLT                      265                 12/09/2020              Anesthesia Other Findings   Reproductive/Obstetrics                           Anesthesia Physical Anesthesia Plan  ASA: 2  Anesthesia Plan: Spinal   Post-op Pain Management:    Induction:   PONV Risk Score and Plan: Treatment may vary due to age or medical condition and Ondansetron  Airway Management Planned: Natural Airway and Nasal Cannula  Additional Equipment:   Intra-op Plan:   Post-operative Plan:   Informed Consent: I have reviewed the patients History and Physical, chart, labs and discussed the procedure including the risks, benefits and alternatives for the proposed anesthesia  with the patient or authorized representative who has indicated his/her understanding and  acceptance.     Dental advisory given  Plan Discussed with: CRNA and Anesthesiologist  Anesthesia Plan Comments: (PAT note by Antionette Poles, PA-C: Follows with cardiology for hx of paroxysmal afib maintained on eliquis. Last seen by Dr. Antoine Poche 11/04/20 and had preop eval at that time. Per note, "PREOP:The patient is at acceptable risk for the planned procedure. She has a high functional level. It is not a high risk procedure. She has no unstable findings or symptoms. According to ACC/AHA guidelines no further testing is indicated. ATRIAL FIB: The patient had paroxysmal atrial fibrillation. Her CHA2DS2-VASc is 3. She tolerates anticoagulation. This can be held as needed for the surgery and we discussed this"  Seen by PCP Dr. Meredith Staggers 11/12/20 and noted to be medically optimized for surgery.   Preop labs reviewed, unremarkable.   EKG 12/09/20: Sinus rhythm with Premature atrial complexes. Rate 70. Possible Anterior infarct , age undetermined. No significant change from previous.  TTE 12/10/19: 1. Left ventricular ejection fraction, by estimation, is 60 to 65%. The  left ventricle has normal function. The left ventricle has no regional  wall motion abnormalities. Left ventricular diastolic parameters are  consistent with Grade I diastolic  dysfunction (impaired relaxation).  2. Right ventricular systolic function is normal. The right ventricular  size is normal. There is normal pulmonary artery systolic pressure.  3. The mitral valve is normal in structure. Trivial mitral valve  regurgitation. No evidence of mitral stenosis.  4. The aortic valve is tricuspid. Aortic valve regurgitation is mild to  moderate. No aortic stenosis is present. Aortic regurgitation PHT measures  588 msec.  5. Aortic dilatation noted. There is mild dilatation at the level of the  sinuses of Valsalva measuring  39 mm.  6. The inferior vena cava is normal in size with greater than 50%  respiratory variability, suggesting right atrial pressure of 3 mmHg.  )      Anesthesia Quick Evaluation

## 2020-12-11 ENCOUNTER — Telehealth: Payer: Self-pay | Admitting: *Deleted

## 2020-12-11 MED ORDER — TRANEXAMIC ACID 1000 MG/10ML IV SOLN
2000.0000 mg | INTRAVENOUS | Status: DC
  Filled 2020-12-11 (×2): qty 20

## 2020-12-11 MED ORDER — TRANEXAMIC ACID 1000 MG/10ML IV SOLN
2000.0000 mg | INTRAVENOUS | Status: DC
  Filled 2020-12-11: qty 20

## 2020-12-11 NOTE — Telephone Encounter (Signed)
Ortho bundle Pre-op call completed. 

## 2020-12-11 NOTE — Care Plan (Signed)
RNCM call to patient to discuss her surgery with Dr. Roda Shutters on Monday, 12/14/20. She is an Ortho bundle patient through [facility] and is agreeable to case management. She lives with her husband here in [place], but her daughter will be picking her up at discharge and she will be going to her daughter's home to stay for about 2 weeks post-op. She has a FWW and toilet riser with handles. No other DME needs at this time. Referral to [facility] ([facility]) after choice provided. Reviewed all post-op care instructions. Patient states she was able to pick up all medications for her normal pharmacy, but they did not have the Percocet prescribed before surgery. Will make MD aware. Requested if something else is needed for after surgery, this be called to [facility] on [address]. Will continue to follow for needs.

## 2020-12-13 ENCOUNTER — Other Ambulatory Visit: Payer: Self-pay | Admitting: Physician Assistant

## 2020-12-13 MED ORDER — ASPIRIN EC 81 MG PO TBEC: 81.0000 mg | DELAYED_RELEASE_TABLET | Freq: Every day | ORAL | 0 refills | Status: DC

## 2020-12-14 ENCOUNTER — Ambulatory Visit (HOSPITAL_COMMUNITY): Payer: Medicare Other | Admitting: Physician Assistant

## 2020-12-14 ENCOUNTER — Ambulatory Visit (HOSPITAL_COMMUNITY): Payer: Medicare Other

## 2020-12-14 ENCOUNTER — Encounter (HOSPITAL_COMMUNITY)
Admission: RE | Disposition: A | Discharge status: Home / Self Care | Payer: Self-pay | Source: Home / Self Care | Attending: Orthopaedic Surgery

## 2020-12-14 ENCOUNTER — Other Ambulatory Visit: Payer: Self-pay

## 2020-12-14 ENCOUNTER — Observation Stay (HOSPITAL_COMMUNITY): Payer: Medicare Other

## 2020-12-14 ENCOUNTER — Encounter (HOSPITAL_COMMUNITY): Payer: Self-pay | Admitting: Orthopaedic Surgery

## 2020-12-14 ENCOUNTER — Observation Stay (HOSPITAL_COMMUNITY)
Admission: RE | Admit: 2020-12-14 | Discharge: 2020-12-15 | Disposition: A | Discharge status: Home / Self Care | Payer: Medicare Other | Attending: Orthopaedic Surgery | Admitting: Orthopaedic Surgery

## 2020-12-14 DIAGNOSIS — Z419 Encounter for procedure for purposes other than remedying health state, unspecified: Secondary | ICD-10-CM

## 2020-12-14 DIAGNOSIS — M810 Age-related osteoporosis without current pathological fracture: Secondary | ICD-10-CM | POA: Diagnosis not present

## 2020-12-14 DIAGNOSIS — Z981 Arthrodesis status: Secondary | ICD-10-CM | POA: Diagnosis not present

## 2020-12-14 DIAGNOSIS — Z87891 Personal history of nicotine dependence: Secondary | ICD-10-CM | POA: Diagnosis not present

## 2020-12-14 DIAGNOSIS — M1611 Unilateral primary osteoarthritis, right hip: Principal | ICD-10-CM

## 2020-12-14 DIAGNOSIS — E039 Hypothyroidism, unspecified: Secondary | ICD-10-CM | POA: Diagnosis not present

## 2020-12-14 DIAGNOSIS — Z96641 Presence of right artificial hip joint: Secondary | ICD-10-CM | POA: Diagnosis not present

## 2020-12-14 DIAGNOSIS — Z96649 Presence of unspecified artificial hip joint: Secondary | ICD-10-CM

## 2020-12-14 DIAGNOSIS — Z79899 Other long term (current) drug therapy: Secondary | ICD-10-CM | POA: Diagnosis not present

## 2020-12-14 DIAGNOSIS — I4891 Unspecified atrial fibrillation: Secondary | ICD-10-CM | POA: Diagnosis not present

## 2020-12-14 DIAGNOSIS — Z471 Aftercare following joint replacement surgery: Secondary | ICD-10-CM | POA: Diagnosis not present

## 2020-12-14 DIAGNOSIS — Z7982 Long term (current) use of aspirin: Secondary | ICD-10-CM | POA: Diagnosis not present

## 2020-12-14 HISTORY — PX: TOTAL HIP ARTHROPLASTY: SHX124

## 2020-12-14 LAB — ABO/RH: ABO/RH(D): O POS

## 2020-12-14 SURGERY — ARTHROPLASTY, HIP, TOTAL, ANTERIOR APPROACH
Anesthesia: Spinal | Site: Hip | Laterality: Right

## 2020-12-14 MED ORDER — METOCLOPRAMIDE HCL 5 MG/ML IJ SOLN: 5.0000 mg | Freq: Three times a day (TID) | INTRAMUSCULAR | Status: DC | PRN

## 2020-12-14 MED ORDER — CEFAZOLIN SODIUM-DEXTROSE 2-4 GM/100ML-% IV SOLN
2.0000 g | Freq: Four times a day (QID) | INTRAVENOUS | Status: AC
  Administered 2020-12-14 (×2): 2 g via INTRAVENOUS
  Filled 2020-12-14 (×2): qty 100

## 2020-12-14 MED ORDER — SODIUM CHLORIDE 0.9 % IV SOLN: INTRAVENOUS | Status: DC

## 2020-12-14 MED ORDER — METHOCARBAMOL 1000 MG/10ML IJ SOLN
500.0000 mg | Freq: Four times a day (QID) | INTRAVENOUS | Status: DC | PRN
  Filled 2020-12-14: qty 5

## 2020-12-14 MED ORDER — VANCOMYCIN HCL 1000 MG IV SOLR
INTRAVENOUS | Status: AC
  Filled 2020-12-14: qty 1000

## 2020-12-14 MED ORDER — ONDANSETRON HCL 4 MG/2ML IJ SOLN
4.0000 mg | Freq: Four times a day (QID) | INTRAMUSCULAR | Status: DC | PRN
  Administered 2020-12-14: 4 mg via INTRAVENOUS

## 2020-12-14 MED ORDER — CEFAZOLIN SODIUM-DEXTROSE 2-4 GM/100ML-% IV SOLN
INTRAVENOUS | Status: AC
  Filled 2020-12-14: qty 100

## 2020-12-14 MED ORDER — PROPOFOL 500 MG/50ML IV EMUL
INTRAVENOUS | Status: DC | PRN
  Administered 2020-12-14: 50 ug/kg/min via INTRAVENOUS

## 2020-12-14 MED ORDER — FENTANYL CITRATE (PF) 250 MCG/5ML IJ SOLN
INTRAMUSCULAR | Status: DC | PRN
  Administered 2020-12-14 (×3): 50 ug via INTRAVENOUS

## 2020-12-14 MED ORDER — TRANEXAMIC ACID-NACL 1000-0.7 MG/100ML-% IV SOLN
1000.0000 mg | Freq: Once | INTRAVENOUS | Status: AC
  Administered 2020-12-14: 1000 mg via INTRAVENOUS
  Filled 2020-12-14: qty 100

## 2020-12-14 MED ORDER — MENTHOL 3 MG MT LOZG: 1.0000 | LOZENGE | OROMUCOSAL | Status: DC | PRN

## 2020-12-14 MED ORDER — HYDROMORPHONE HCL 1 MG/ML IJ SOLN
0.5000 mg | INTRAMUSCULAR | Status: DC | PRN
Start: 2020-12-14 — End: 2020-12-15
  Administered 2020-12-14: 1 mg via INTRAVENOUS
  Filled 2020-12-14: qty 1

## 2020-12-14 MED ORDER — SODIUM CHLORIDE 0.9 % IR SOLN
Status: DC | PRN
  Administered 2020-12-14: 1000 mL

## 2020-12-14 MED ORDER — DIPHENHYDRAMINE HCL 12.5 MG/5ML PO ELIX
25.0000 mg | ORAL_SOLUTION | ORAL | Status: DC | PRN
  Filled 2020-12-14: qty 10

## 2020-12-14 MED ORDER — METOCLOPRAMIDE HCL 5 MG PO TABS: 5.0000 mg | ORAL_TABLET | Freq: Three times a day (TID) | ORAL | Status: DC | PRN

## 2020-12-14 MED ORDER — FENTANYL CITRATE (PF) 250 MCG/5ML IJ SOLN
INTRAMUSCULAR | Status: AC
  Filled 2020-12-14: qty 5

## 2020-12-14 MED ORDER — ACETAMINOPHEN 325 MG PO TABS: 325.0000 mg | ORAL_TABLET | Freq: Four times a day (QID) | ORAL | Status: DC | PRN

## 2020-12-14 MED ORDER — SORBITOL 70 % SOLN
30.0000 mL | Freq: Every day | Status: DC | PRN
  Filled 2020-12-14: qty 30

## 2020-12-14 MED ORDER — METHOCARBAMOL 500 MG PO TABS
500.0000 mg | ORAL_TABLET | Freq: Four times a day (QID) | ORAL | Status: DC | PRN
  Administered 2020-12-14 – 2020-12-15 (×2): 500 mg via ORAL
  Filled 2020-12-14 (×2): qty 1

## 2020-12-14 MED ORDER — EPHEDRINE 5 MG/ML INJ
INTRAVENOUS | Status: AC
  Filled 2020-12-14: qty 10

## 2020-12-14 MED ORDER — OXYCODONE HCL 5 MG PO TABS: 10.0000 mg | ORAL_TABLET | ORAL | Status: DC | PRN

## 2020-12-14 MED ORDER — BUPIVACAINE-MELOXICAM ER 400-12 MG/14ML IJ SOLN
INTRAMUSCULAR | Status: AC
  Filled 2020-12-14: qty 1

## 2020-12-14 MED ORDER — TRANEXAMIC ACID-NACL 1000-0.7 MG/100ML-% IV SOLN
INTRAVENOUS | Status: AC
  Filled 2020-12-14: qty 100

## 2020-12-14 MED ORDER — TRANEXAMIC ACID-NACL 1000-0.7 MG/100ML-% IV SOLN
1000.0000 mg | INTRAVENOUS | Status: AC
  Administered 2020-12-14: 1000 mg via INTRAVENOUS

## 2020-12-14 MED ORDER — POLYETHYLENE GLYCOL 3350 17 G PO PACK
17.0000 g | PACK | Freq: Every day | ORAL | Status: DC
  Administered 2020-12-15: 17 g via ORAL
  Filled 2020-12-14: qty 1

## 2020-12-14 MED ORDER — DEXAMETHASONE SODIUM PHOSPHATE 10 MG/ML IJ SOLN
10.0000 mg | Freq: Once | INTRAMUSCULAR | Status: AC
  Administered 2020-12-15: 10 mg via INTRAVENOUS
  Filled 2020-12-14: qty 1

## 2020-12-14 MED ORDER — LEVOTHYROXINE SODIUM 88 MCG PO TABS
44.0000 ug | ORAL_TABLET | ORAL | Status: DC
  Administered 2020-12-15: 44 ug via ORAL
  Filled 2020-12-14: qty 0.5

## 2020-12-14 MED ORDER — CEFAZOLIN SODIUM-DEXTROSE 2-4 GM/100ML-% IV SOLN
2.0000 g | INTRAVENOUS | Status: AC
  Administered 2020-12-14: 2 g via INTRAVENOUS

## 2020-12-14 MED ORDER — IRRISEPT - 450ML BOTTLE WITH 0.05% CHG IN STERILE WATER, USP 99.95% OPTIME
TOPICAL | Status: DC | PRN
  Administered 2020-12-14: 450 mL via TOPICAL

## 2020-12-14 MED ORDER — TRANEXAMIC ACID 1000 MG/10ML IV SOLN
INTRAVENOUS | Status: DC | PRN
  Administered 2020-12-14: 2000 mg via TOPICAL

## 2020-12-14 MED ORDER — VANCOMYCIN HCL 1 G IV SOLR
INTRAVENOUS | Status: DC | PRN
  Administered 2020-12-14: 1000 mg via TOPICAL

## 2020-12-14 MED ORDER — MAGNESIUM CITRATE PO SOLN: 1.0000 | Freq: Once | ORAL | Status: DC | PRN

## 2020-12-14 MED ORDER — EPHEDRINE SULFATE-NACL 50-0.9 MG/10ML-% IV SOSY
PREFILLED_SYRINGE | INTRAVENOUS | Status: DC | PRN
  Administered 2020-12-14 (×4): 10 mg via INTRAVENOUS

## 2020-12-14 MED ORDER — OXYCODONE HCL 5 MG PO TABS
5.0000 mg | ORAL_TABLET | ORAL | Status: DC | PRN
  Administered 2020-12-15 (×2): 10 mg via ORAL
  Filled 2020-12-14 (×2): qty 2

## 2020-12-14 MED ORDER — OXYCODONE HCL ER 10 MG PO T12A
10.0000 mg | EXTENDED_RELEASE_TABLET | Freq: Two times a day (BID) | ORAL | Status: DC
  Administered 2020-12-14 – 2020-12-15 (×2): 10 mg via ORAL
  Filled 2020-12-14 (×3): qty 1

## 2020-12-14 MED ORDER — ACETAMINOPHEN 500 MG PO TABS
1000.0000 mg | ORAL_TABLET | Freq: Four times a day (QID) | ORAL | Status: AC
  Administered 2020-12-14 – 2020-12-15 (×3): 1000 mg via ORAL
  Filled 2020-12-14 (×3): qty 2

## 2020-12-14 MED ORDER — LEVOTHYROXINE SODIUM 88 MCG PO TABS: 44.0000 ug | ORAL_TABLET | ORAL | Status: DC

## 2020-12-14 MED ORDER — 0.9 % SODIUM CHLORIDE (POUR BTL) OPTIME
TOPICAL | Status: DC | PRN
  Administered 2020-12-14: 1000 mL

## 2020-12-14 MED ORDER — DOCUSATE SODIUM 100 MG PO CAPS
100.0000 mg | ORAL_CAPSULE | Freq: Two times a day (BID) | ORAL | Status: DC
  Administered 2020-12-14 – 2020-12-15 (×2): 100 mg via ORAL
  Filled 2020-12-14 (×2): qty 1

## 2020-12-14 MED ORDER — CHLORHEXIDINE GLUCONATE 0.12 % MT SOLN
OROMUCOSAL | Status: AC
  Administered 2020-12-14: 15 mL
  Filled 2020-12-14: qty 15

## 2020-12-14 MED ORDER — ONDANSETRON HCL 4 MG/2ML IJ SOLN
INTRAMUSCULAR | Status: AC
  Filled 2020-12-14: qty 2

## 2020-12-14 MED ORDER — ALUM & MAG HYDROXIDE-SIMETH 200-200-20 MG/5ML PO SUSP: 30.0000 mL | ORAL | Status: DC | PRN

## 2020-12-14 MED ORDER — APIXABAN 2.5 MG PO TABS
2.5000 mg | ORAL_TABLET | Freq: Two times a day (BID) | ORAL | Status: DC
  Administered 2020-12-15: 2.5 mg via ORAL
  Filled 2020-12-14 (×2): qty 1

## 2020-12-14 MED ORDER — POVIDONE-IODINE 10 % EX SWAB
2.0000 "application " | Freq: Once | CUTANEOUS | Status: AC
  Administered 2020-12-14: 2 via TOPICAL

## 2020-12-14 MED ORDER — BUPIVACAINE-MELOXICAM ER 400-12 MG/14ML IJ SOLN
INTRAMUSCULAR | Status: DC | PRN
  Administered 2020-12-14: 400 mg

## 2020-12-14 MED ORDER — LEVOTHYROXINE SODIUM 88 MCG PO TABS: 88.0000 ug | ORAL_TABLET | ORAL | Status: DC

## 2020-12-14 MED ORDER — BUPIVACAINE IN DEXTROSE 0.75-8.25 % IT SOLN
INTRATHECAL | Status: DC | PRN
  Administered 2020-12-14: 12 mg via INTRATHECAL

## 2020-12-14 MED ORDER — ONDANSETRON HCL 4 MG PO TABS
4.0000 mg | ORAL_TABLET | Freq: Four times a day (QID) | ORAL | Status: DC | PRN
Start: 2020-12-14 — End: 2020-12-15

## 2020-12-14 MED ORDER — LACTATED RINGERS IV SOLN: INTRAVENOUS | Status: DC

## 2020-12-14 MED ORDER — PHENOL 1.4 % MT LIQD: 1.0000 | OROMUCOSAL | Status: DC | PRN

## 2020-12-14 SURGICAL SUPPLY — 60 items
BAG DECANTER FOR FLEXI CONT (MISCELLANEOUS) ×3 IMPLANT
CELLS DAT CNTRL 66122 CELL SVR (MISCELLANEOUS) IMPLANT
COVER PERINEAL POST (MISCELLANEOUS) ×3 IMPLANT
COVER SURGICAL LIGHT HANDLE (MISCELLANEOUS) ×3 IMPLANT
COVER WAND RF STERILE (DRAPES) ×3 IMPLANT
CUP SECTOR GRIPTON 50MM (Cup) ×3 IMPLANT
DRAPE C-ARM 42X72 X-RAY (DRAPES) ×3 IMPLANT
DRAPE POUCH INSTRU U-SHP 10X18 (DRAPES) ×3 IMPLANT
DRAPE STERI IOBAN 125X83 (DRAPES) ×3 IMPLANT
DRAPE U-SHAPE 47X51 STRL (DRAPES) ×6 IMPLANT
DRSG AQUACEL AG ADV 3.5X10 (GAUZE/BANDAGES/DRESSINGS) ×3 IMPLANT
DURAPREP 26ML APPLICATOR (WOUND CARE) ×6 IMPLANT
ELECT BLADE 4.0 EZ CLEAN MEGAD (MISCELLANEOUS) ×3
ELECT REM PT RETURN 9FT ADLT (ELECTROSURGICAL) ×3
ELECTRODE BLDE 4.0 EZ CLN MEGD (MISCELLANEOUS) ×1 IMPLANT
ELECTRODE REM PT RTRN 9FT ADLT (ELECTROSURGICAL) ×1 IMPLANT
GLOVE ECLIPSE 7.0 STRL STRAW (GLOVE) ×6 IMPLANT
GLOVE SKINSENSE NS SZ7.5 (GLOVE) ×2
GLOVE SKINSENSE STRL SZ7.5 (GLOVE) ×1 IMPLANT
GLOVE SURG SYN 7.5  E (GLOVE) ×12
GLOVE SURG SYN 7.5 E (GLOVE) ×4 IMPLANT
GLOVE SURG UNDER POLY LF SZ7 (GLOVE) ×3 IMPLANT
GOWN STRL REIN XL XLG (GOWN DISPOSABLE) ×3 IMPLANT
GOWN STRL REUS W/ TWL LRG LVL3 (GOWN DISPOSABLE) IMPLANT
GOWN STRL REUS W/ TWL XL LVL3 (GOWN DISPOSABLE) ×1 IMPLANT
GOWN STRL REUS W/TWL LRG LVL3 (GOWN DISPOSABLE)
GOWN STRL REUS W/TWL XL LVL3 (GOWN DISPOSABLE) ×3
HANDPIECE INTERPULSE COAX TIP (DISPOSABLE) ×3
HEAD FEM STD 32X+5 STRL (Hips) ×3 IMPLANT
HOOD PEEL AWAY FLYTE STAYCOOL (MISCELLANEOUS) ×6 IMPLANT
IV NS IRRIG 3000ML ARTHROMATIC (IV SOLUTION) ×3 IMPLANT
JET LAVAGE IRRISEPT WOUND (IRRIGATION / IRRIGATOR) ×3
KIT BASIN OR (CUSTOM PROCEDURE TRAY) ×3 IMPLANT
LAVAGE JET IRRISEPT WOUND (IRRIGATION / IRRIGATOR) ×1 IMPLANT
LINER ACET PNNCL PLUS4 NEUTRAL (Hips) ×1 IMPLANT
MARKER SKIN DUAL TIP RULER LAB (MISCELLANEOUS) ×3 IMPLANT
NEEDLE SPNL 18GX3.5 QUINCKE PK (NEEDLE) ×3 IMPLANT
PACK TOTAL JOINT (CUSTOM PROCEDURE TRAY) ×3 IMPLANT
PACK UNIVERSAL I (CUSTOM PROCEDURE TRAY) ×3 IMPLANT
PINNACLE PLUS 4 NEUTRAL (Hips) ×3 IMPLANT
RETRACTOR WND ALEXIS 18 MED (MISCELLANEOUS) IMPLANT
RTRCTR WOUND ALEXIS 18CM MED (MISCELLANEOUS)
SAW OSC TIP CART 19.5X105X1.3 (SAW) ×3 IMPLANT
SCREW 6.5MMX25MM (Screw) ×2 IMPLANT
SET HNDPC FAN SPRY TIP SCT (DISPOSABLE) ×1 IMPLANT
STAPLER VISISTAT 35W (STAPLE) IMPLANT
STEM FEM SZ3 STD ACTIS (Stem) ×3 IMPLANT
SUT ETHIBOND 2 V 37 (SUTURE) ×3 IMPLANT
SUT VIC AB 0 CT1 27 (SUTURE) ×3
SUT VIC AB 0 CT1 27XBRD ANBCTR (SUTURE) ×1 IMPLANT
SUT VIC AB 1 CTX 36 (SUTURE) ×3
SUT VIC AB 1 CTX36XBRD ANBCTR (SUTURE) ×1 IMPLANT
SUT VIC AB 2-0 CT1 27 (SUTURE) ×6
SUT VIC AB 2-0 CT1 TAPERPNT 27 (SUTURE) ×2 IMPLANT
SYR 50ML LL SCALE MARK (SYRINGE) ×3 IMPLANT
TOWEL GREEN STERILE (TOWEL DISPOSABLE) ×3 IMPLANT
TRAY CATH 16FR W/PLASTIC CATH (SET/KITS/TRAYS/PACK) IMPLANT
TRAY FOLEY W/BAG SLVR 16FR (SET/KITS/TRAYS/PACK) ×3
TRAY FOLEY W/BAG SLVR 16FR ST (SET/KITS/TRAYS/PACK) ×1 IMPLANT
YANKAUER SUCT BULB TIP NO VENT (SUCTIONS) ×3 IMPLANT

## 2020-12-14 NOTE — Evaluation (Signed)
Physical Therapy Evaluation Patient Details Name: Kelly Mcdowell MRN: [id number] DOB: 09/07/1940 Today's Date: 12/14/2020   History of Present Illness  Pt adm 6/13 for rt THR direct anterior approach. PMH - arthritis, osteoporosis, cataract  Clinical Impression  Pt moving fairly well s/p rt THR but today limited by nausea. Expect pt to progress and she plans to go home with her daughter at DC.     Follow Up Recommendations Follow surgeon's recommendation for DC plan and follow-up therapies    Equipment Recommendations  None recommended by PT    Recommendations for Other Services       Precautions / Restrictions Precautions Precautions: None Restrictions Weight Bearing Restrictions: No      Mobility  Bed Mobility Overal bed mobility: Needs Assistance Bed Mobility: Supine to Sit     Supine to sit: Min assist     General bed mobility comments: Assist to bring RLE off the bed    Transfers Overall transfer level: Needs assistance Equipment used: Rolling walker (2 wheeled) Transfers: Sit to/from Stand Sit to Stand: Min assist         General transfer comment: Assist for stability to rise. Verbal cues for hand placement  Ambulation/Gait Ambulation/Gait assistance: Min guard Gait Distance (Feet): 3 Feet Assistive device: Rolling walker (2 wheeled) Gait Pattern/deviations: Step-through pattern;Decreased step length - right;Decreased step length - left Gait velocity: decr Gait velocity interpretation: <1.31 ft/sec, indicative of household ambulator General Gait Details: Assist for safety and lines. Distance limited by nausea.  Stairs            Wheelchair Mobility    Modified Rankin (Stroke Patients Only)       Balance Overall balance assessment: Mild deficits observed, not formally tested                                           Pertinent Vitals/Pain Pain Assessment: 0-10 Pain Score: 8  Pain Location: rt hip Pain Descriptors  / Indicators: Grimacing;Guarding Pain Intervention(s): Limited activity within patient's tolerance;Monitored during session;Premedicated before session;Repositioned;Ice applied    Home Living Family/patient expects to be discharged to:: Private residence Living Arrangements: Spouse/significant other;Other (Comment) (Pt will be staying with daughter at her home) Available Help at Discharge: Family;Available 24 hours/day Type of Home: House Home Access: Stairs to enter   [employer] of Steps: 1 Home Layout: One level Home Equipment: Walker - 2 wheels;Toilet riser      Prior Function Level of Independence: Independent               Hand Dominance        Extremity/Trunk Assessment   Upper Extremity Assessment Upper Extremity Assessment: Overall WFL for tasks assessed    Lower Extremity Assessment Lower Extremity Assessment: RLE deficits/detail RLE Deficits / Details: Limited by post op pain       Communication   Communication: No difficulties  Cognition Arousal/Alertness: Awake/alert Behavior During Therapy: WFL for tasks assessed/performed Overall Cognitive Status: Within Functional Limits for tasks assessed                                        General Comments      Exercises     Assessment/Plan    PT Assessment Patient needs continued PT services  PT Problem List Decreased  strength;Decreased mobility;Decreased activity tolerance;Pain       PT Treatment Interventions DME instruction;Gait training;Functional mobility training;Therapeutic activities;Therapeutic exercise;Patient/family education    PT Goals (Current goals can be found in the Care Plan section)  Acute Rehab PT Goals Patient Stated Goal: return home PT Goal Formulation: With patient/family Time For Goal Achievement: 12/18/20 Potential to Achieve Goals: Good    Frequency 7X/week   Barriers to discharge        Co-evaluation               AM-PAC PT  "6 Clicks" Mobility  Outcome Measure Help needed turning from your back to your side while in a flat bed without using bedrails?: A Little Help needed moving from lying on your back to sitting on the side of a flat bed without using bedrails?: A Little Help needed moving to and from a bed to a chair (including a wheelchair)?: A Little Help needed standing up from a chair using your arms (e.g., wheelchair or bedside chair)?: A Little Help needed to walk in hospital room?: A Little Help needed climbing 3-5 steps with a railing? : A Little 6 Click Score: 18    End of Session Equipment Utilized During Treatment: Gait belt Activity Tolerance: Other (comment) (limited by nausea) Patient left: in chair;with call bell/phone within reach;with family/visitor present Nurse Communication: Mobility status;Other (comment) (nausea) PT Visit Diagnosis: Other abnormalities of gait and mobility (R26.89);Pain Pain - Right/Left: Right Pain - part of body: Hip    Time: 1810-1830 PT Time Calculation (min) (ACUTE ONLY): 20 min   Charges:   PT Evaluation $PT Eval Low Complexity: 1 Low          [facility] PT Acute Rehabilitation Services Pager [phone] Office [phone]   Angelina [facility] 12/14/2020, 6:45 PM

## 2020-12-14 NOTE — Op Note (Signed)
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 Total hip replacement; Right hip; uncemented [id number]   Surgeon: Gershon Mussel, M.D.  Assist: Oneal Grout, PA-C   Anesthesia: spinal  Prosthesis: Depuy Acetabulum: Pinnacle 50 mm Femur: Actis 3 STD Head: 32 mm size: +5 Liner: +4 Bearing Type: metal/poly  Total Hip Arthroplasty (Anterior Approach) Op Note:  After informed consent was obtained and the operative extremity marked in the holding area, the patient was brought back to the operating room and placed supine on the HANA table. Next, the operative extremity was prepped and draped in normal sterile fashion. Surgical timeout occurred verifying patient identification, surgical site, surgical procedure and administration of antibiotics.  A modified anterior Smith-Peterson approach to the hip was performed, using the interval between tensor fascia lata and sartorius.  Dissection was carried bluntly down onto the anterior hip capsule. The lateral femoral circumflex vessels were identified and coagulated. A capsulotomy was performed and the capsular flaps tagged for later repair.  The neck osteotomy was performed. The femoral head was removed which showed severe degenerative wear and joint effusion, the acetabular rim was cleared of soft tissue and attention was turned to reaming the acetabulum.  Sequential reaming was performed under fluoroscopic guidance. We reamed to a size 49 mm, and then impacted the acetabular shell. A 25 mm cancellous screw was placed through the shell for added fixation.  The liner was then placed after irrigation and attention turned to the femur.  After placing the femoral hook, the leg was taken to externally rotated, extended and adducted position taking care to perform soft tissue releases to allow for  adequate mobilization of the femur. Soft tissue was cleared from the shoulder of the greater trochanter and the hook elevator used to improve exposure of the proximal femur. Sequential broaching performed up to a size 3. Trial neck and head were placed. The leg was brought back up to neutral and the construct reduced.  Antibiotic irrigation was placed in the surgical wound and kept for at least 1 minute.  The position and sizing of components, offset and leg lengths were checked using fluoroscopy. Stability of the construct was checked in extension and external rotation without any subluxation or impingement of prosthesis. We dislocated the prosthesis, dropped the leg back into position, removed trial components, and irrigated copiously. The final stem and head was then placed, the leg brought back up, the system reduced and fluoroscopy used to verify positioning.  We irrigated, obtained hemostasis and closed the capsule using #2 ethibond suture.  One gram of vancomycin powder was placed in the surgical bed.   One gram of topical tranexamic acid was injected into the joint.  The fascia was closed with #1 vicryl plus, the deep fat layer was closed with 0 vicryl, the subcutaneous layers closed with 2.0 Vicryl Plus and the skin closed with 2.0 nylon and dermabond. A sterile dressing was applied. The patient was awakened in the operating room and taken to recovery in stable condition.  All sponge, needle, and instrument counts were correct at the end of the case.   Tessa Lerner, my PA, was a medical necessity for opening, closing, limb positioning, retracting, exposing, and overall facilitation and timely completion of the surgery.  Position: supine  Complications: see description of procedure.  Time Out: performed   Drains/Packing:  none  Estimated blood loss: see anesthesia record  Returned to Recovery Room: in good condition.   Antibiotics: yes   Mechanical VTE (DVT) Prophylaxis: sequential  compression devices, TED thigh-high  Chemical VTE (DVT) Prophylaxis: eliquis and aspirin   Fluid Replacement: see anesthesia record  Specimens Removed: 1 to pathology   Sponge and Instrument Count Correct? yes   PACU: portable radiograph - low AP   Plan/RTC: Return in 2 weeks for staple removal. Weight Bearing/Load Lower Extremity: full  Hip precautions: none Suture Removal: 2 weeks   N. Glee Arvin, MD [facility] 1:06 PM   Implant Name Type Inv. Item Serial No. Manufacturer Lot No. LRB No. Used Action  PINNACLE PLUS 4 NEUTRAL - [id number] Hips PINNACLE PLUS 4 NEUTRAL  [employer] [id number] Right 1 Implanted  SCREW 6.5MMX25MM - [id number] Screw SCREW 6.5MMX25MM  [employer] [id number] Right 1 Implanted  CUP SECTOR GRIPTON - [id number] Cup CUP SECTOR GRIPTON  [employer] [id number] Right 1 Implanted  STEM FEM SZ3 STD ACTIS - [id number] Stem STEM FEM SZ3 STD ACTIS  [employer]PUY ORTH[employer] [id number] Right 1 Implanted  HIP BALL ARTICU DEPUY - [id number] Hips HIP BALL ARTICU DEPUY  [employer]TH[employer] [id number] Right 1 Implanted

## 2020-12-14 NOTE — H&P (Signed)
PREOPERATIVE H&P  Chief Complaint: right hip degenerative joing disease  HPI: Kelly Mcdowell is a 80 y.o. female who presents for surgical treatment of right hip degenerative joing disease.  She denies any changes in medical history.  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   Thyroid disease    UTI (urinary tract infection)    Past Surgical History:  Procedure Laterality Date   CESAREAN SECTION     COLONOSCOPY     TUBAL LIGATION     Social History   Socioeconomic History   Marital status: Married    Spouse name: Not on file   Number of children: Not on file   Years of education: Not on file   Highest education level: Not on file  Occupational History   Not on file  Tobacco Use   Smoking status: Former    Pack years: 0.00    Types: Cigarettes    Quit date: 03/04/2005    Years since quitting: 15.7   Smokeless tobacco: Never  Vaping Use   Vaping Use: Never used  Substance and Sexual Activity   Alcohol use: No   Drug use: No   Sexual activity: Never    Birth control/protection: Surgical  Other Topics Concern   Not on file  Social History Narrative   Married and lives with husband.  Three daughters.  12 grands.     Social Determinants of Health   Financial Resource Strain: Not on file  Food Insecurity: Not on file  Transportation Needs: Not on file  Physical Activity: Not on file  Stress: Not on file  Social Connections: Not on file   Family History  Problem Relation Age of Onset   Congestive Heart Failure Father    Cancer Sister        breast   Breast cancer Sister    Colon polyps Sister    Cancer Maternal Grandmother    Breast cancer Maternal Aunt    Stomach cancer Maternal Aunt    Colon cancer Neg Hx    Esophageal cancer Neg Hx    Rectal cancer Neg Hx    Not on File Prior to  Admission medications   Medication Sig Start Date End Date Taking? Authorizing Provider  apixaban (ELIQUIS) 5 MG TABS tablet Take 1 tablet (5 mg total) by mouth 2 (two) times daily. Patient taking differently: Take 5 mg by mouth daily. 08/13/20  Yes Rollene Rotunda, MD  cholecalciferol (VITAMIN D3) 25 MCG (1000 UNIT) tablet Take 2,000 Units by mouth daily.   Yes [provider]  famotidine (PEPCID) 20 MG tablet Take 20 mg by mouth daily as needed for heartburn or indigestion.   Yes [provider]  ibuprofen (ADVIL,MOTRIN) 400 MG tablet Take 400 mg by mouth every 6 (six) hours as needed for moderate pain.   Yes [provider]  levothyroxine (SYNTHROID) 88 MCG tablet Take 1 tablet (88 mcg total) by mouth daily before breakfast. 4 days per week - alternating with 1/2 tablet on other 3 days. Patient taking differently: Take 44-88 mcg by mouth See admin instructions. Take 88 mcg by mouth every other day, alternating with 44 mcg on alternate days 08/28/20  Yes [name], MD  Menthol, Topical  Analgesic, (BIOFREEZE ROLL-ON) 4 % GEL Apply 1 application topically daily as needed (pain).   Yes [provider]  aspirin EC 81 MG tablet Take 1 tablet (81 mg total) by mouth daily. Take once daily x 6 weeks after surgery IF still only taking one eliquis instead of 2. 12/13/20   Cristie Hem, PA-C  diclofenac Sodium (VOLTAREN) 1 % GEL Apply 4 g topically 4 (four) times daily. Patient not taking: Reported on 12/02/2020 08/28/20   [name], MD  docusate sodium (COLACE) 100 MG capsule Take 1 capsule (100 mg total) by mouth daily as needed. 12/07/20 12/07/21  Cristie Hem, PA-C  famotidine (PEPCID) 10 MG tablet Take 1 tablet (10 mg total) by mouth 2 (two) times daily. Patient not taking: No sig reported 09/18/20   [name], MD  hydrOXYzine (ATARAX/VISTARIL) 25 MG tablet Take 1/2 tablet to one whole tablet by mouth daily as needed for itching Patient not  taking: Reported on 12/02/2020 05/30/17   [name], MD  methocarbamol (ROBAXIN) 500 MG tablet Take 1 tablet (500 mg total) by mouth 2 (two) times daily as needed. To be taken after surgery 12/07/20   Cristie Hem, PA-C  ondansetron (ZOFRAN) 4 MG tablet Take 1 tablet (4 mg total) by mouth every 8 (eight) hours as needed for nausea or vomiting. 12/07/20   Cristie Hem, PA-C  oxyCODONE-acetaminophen (PERCOCET) 5-325 MG tablet Take 1-2 tablets by mouth every 6 (six) hours as needed. To be taken after surgery 12/07/20   Cristie Hem, PA-C     Positive ROS: All other systems have been reviewed and were otherwise negative with the exception of those mentioned in the HPI and as above.  Physical Exam: General: Alert, no acute distress Cardiovascular: No pedal edema Respiratory: No cyanosis, no use of accessory musculature GI: abdomen soft Skin: No lesions in the area of chief complaint Neurologic: Sensation intact distally Psychiatric: Patient is competent for consent with normal mood and affect Lymphatic: no lymphedema  MUSCULOSKELETAL: exam stable  Assessment: right hip degenerative joing disease  Plan: Plan for Procedure(s): RIGHT TOTAL HIP ARTHROPLASTY ANTERIOR APPROACH  The risks benefits and alternatives were discussed with the patient including but not limited to the risks of nonoperative treatment, versus surgical intervention including infection, bleeding, nerve injury,  blood clots, cardiopulmonary complications, morbidity, mortality, among others, and they were willing to proceed.   Preoperative templating of the joint replacement has been completed, documented, and submitted to the Operating Room personnel in order to optimize intra-operative equipment management.   Glee Arvin, MD 12/14/2020 11:42 AM

## 2020-12-14 NOTE — Anesthesia Postprocedure Evaluation (Signed)
Anesthesia Post Note  Patient: Kelly Mcdowell  Procedure(s) Performed: RIGHT TOTAL HIP ARTHROPLASTY ANTERIOR APPROACH (Right: Hip)     Patient location during evaluation: Nursing Unit Anesthesia Type: Spinal Level of consciousness: oriented and awake and alert Pain management: pain level controlled Vital Signs Assessment: post-procedure vital signs reviewed and stable Respiratory status: spontaneous breathing and respiratory function stable Cardiovascular status: blood pressure returned to baseline and stable Postop Assessment: no headache, no backache, no apparent nausea or vomiting and patient able to bend at knees Anesthetic complications: no   No notable events documented.  Last Vitals:  Vitals:   12/14/20 1525 12/14/20 1551  BP: 121/79 137/64  Pulse: 63 (!) 53  Resp: 20 20  Temp: 36.5 C 36.6 C  SpO2: 94% 98%    Last Pain:  Vitals:   12/14/20 1510  TempSrc:   PainSc: 0-No pain                 Trevor Iha

## 2020-12-14 NOTE — Care Plan (Signed)
Ortho Bundle Case Management Note  Patient Details  Name: Kelly Mcdowell MRN: [id number] Date of Birth: 10-13-1940   [facility] call to patient to discuss her surgery with Dr. Roda Shutters on Monday, 12/14/20. She is an Ortho bundle patient through [facility] and is agreeable to case management. She lives with her husband here in [place], but her daughter will be picking her up at discharge and she will be going to her daughter's home to stay for about 2 weeks post-op. She has a FWW and toilet riser with handles. No other DME needs at this time. Referral to [facility] ([facility]) after choice provided. Reviewed all post-op care instructions. Patient states she was able to pick up all medications for her normal pharmacy, but they did not have the Percocet prescribed before surgery. Will make MD aware. Requested if something else is needed for after surgery, this be called to [facility] on [address]. Will continue to follow for needs.                  DME Arranged:   (Patient verbalized she has FWW and toilet riser in the home; no DME needs at this time.) DME Agency:     HH Arranged:  PT HH Agency:  [facility]  Additional Comments: Please contact me with any questions of if this plan should need to change.  Ralph Dowdy, RN, BSN, [employer]  [phone] 12/14/2020, 4:53 PM

## 2020-12-14 NOTE — Anesthesia Procedure Notes (Signed)
Procedure Name: MAC Date/Time: 12/14/2020 12:35 PM Performed by: Lieutenant Diego, CRNA Pre-anesthesia Checklist: Patient identified, Emergency Drugs available, Suction available, Patient being monitored and Timeout performed Patient Re-evaluated:Patient Re-evaluated prior to induction Oxygen Delivery Method: Simple face mask Preoxygenation: Pre-oxygenation with 100% oxygen Induction Type: IV induction

## 2020-12-14 NOTE — Transfer of Care (Signed)
Immediate Anesthesia Transfer of Care Note  Patient: Kelly Mcdowell  Procedure(s) Performed: RIGHT TOTAL HIP ARTHROPLASTY ANTERIOR APPROACH (Right: Hip)  Patient Location: PACU  Anesthesia Type:MAC and Spinal  Level of Consciousness: awake, alert  and oriented  Airway & Oxygen Therapy: Patient Spontanous Breathing and Patient connected to face mask oxygen  Post-op Assessment: Report given to RN and Post -op Vital signs reviewed and stable  Post vital signs: Reviewed and stable  Last Vitals:  Vitals Value Taken Time  BP    Temp    Pulse    Resp    SpO2      Last Pain:  Vitals:   12/14/20 1115  TempSrc:   PainSc: 0-No pain         Complications: No notable events documented.

## 2020-12-14 NOTE — Anesthesia Procedure Notes (Signed)
Spinal  Patient location during procedure: OR Start time: 12/14/2020 11:48 AM End time: 12/14/2020 11:54 AM Reason for block: surgical anesthesia Staffing Anesthesiologist: Trevor Iha, MD Preanesthetic Checklist Completed: patient identified, IV checked, site marked, risks and benefits discussed, surgical consent, monitors and equipment checked, pre-op evaluation and timeout performed Spinal Block Patient position: sitting Prep: DuraPrep Patient monitoring: heart rate, cardiac monitor, continuous pulse ox and blood pressure Approach: midline Location: L3-4 Injection technique: single-shot Needle Needle type: Sprotte  Needle gauge: 24 G Needle length: 9 cm Needle insertion depth: 5 cm Assessment Sensory level: T4 Events: CSF return Additional Notes 1 attempt pt tolerated procedure well.

## 2020-12-14 NOTE — Discharge Instructions (Signed)

## 2020-12-15 ENCOUNTER — Encounter (HOSPITAL_COMMUNITY): Payer: Self-pay | Admitting: Orthopaedic Surgery

## 2020-12-15 DIAGNOSIS — Z7982 Long term (current) use of aspirin: Secondary | ICD-10-CM | POA: Diagnosis not present

## 2020-12-15 DIAGNOSIS — Z79899 Other long term (current) drug therapy: Secondary | ICD-10-CM | POA: Diagnosis not present

## 2020-12-15 DIAGNOSIS — M1611 Unilateral primary osteoarthritis, right hip: Secondary | ICD-10-CM | POA: Diagnosis not present

## 2020-12-15 DIAGNOSIS — E039 Hypothyroidism, unspecified: Secondary | ICD-10-CM | POA: Diagnosis not present

## 2020-12-15 DIAGNOSIS — Z87891 Personal history of nicotine dependence: Secondary | ICD-10-CM | POA: Diagnosis not present

## 2020-12-15 LAB — CBC
HCT: 36.8 % (ref 36.0–46.0)
Hemoglobin: 12.3 g/dL (ref 12.0–15.0)
MCH: 30.2 pg (ref 26.0–34.0)
MCHC: 33.4 g/dL (ref 30.0–36.0)
MCV: 90.4 fL (ref 80.0–100.0)
Platelets: 215 10*3/uL (ref 150–400)
RBC: 4.07 MIL/uL (ref 3.87–5.11)
RDW: 12.2 % (ref 11.5–15.5)
WBC: 10.6 10*3/uL — ABNORMAL HIGH (ref 4.0–10.5)
nRBC: 0 % (ref 0.0–0.2)

## 2020-12-15 LAB — BASIC METABOLIC PANEL
Anion gap: 9 (ref 5–15)
BUN: 14 mg/dL (ref 8–23)
CO2: 26 mmol/L (ref 22–32)
Calcium: 8.6 mg/dL — ABNORMAL LOW (ref 8.9–10.3)
Chloride: 100 mmol/L (ref 98–111)
Creatinine, Ser: 0.71 mg/dL (ref 0.44–1.00)
GFR, Estimated: >60 mL/min (ref >60)
Glucose, Bld: 154 mg/dL — ABNORMAL HIGH (ref 70–99)
Potassium: 3.4 mmol/L — ABNORMAL LOW (ref 3.5–5.1)
Sodium: 135 mmol/L (ref 135–145)

## 2020-12-15 MED ORDER — HYDROXYZINE HCL 50 MG/ML IM SOLN
50.0000 mg | Freq: Once | INTRAMUSCULAR | Status: AC
  Administered 2020-12-15: 50 mg via INTRAMUSCULAR
  Filled 2020-12-15: qty 1

## 2020-12-15 NOTE — Progress Notes (Signed)
Physical Therapy Treatment Patient Details Name: Kelly Mcdowell MRN: [id number] DOB: 1941/03/01 Today's Date: 12/15/2020    History of Present Illness Pt adm 6/13 for Rt THA direct anterior approach. PMH - arthritis, osteoporosis, cataract    PT Comments    Pt without nausea this session with increased gait and activity tolerance able to walk, complete stairs and HEP. Pt educated for progressive activity at D/C and continued HEP as well as eating. Pt appropriate for D/C .     Follow Up Recommendations  Follow surgeon's recommendation for DC plan and follow-up therapies     Equipment Recommendations  Rolling walker with 5" wheels (youth)    Recommendations for Other Services       Precautions / Restrictions Precautions Precautions: Fall Restrictions Weight Bearing Restrictions: No    Mobility  Bed Mobility              General bed mobility comments: in chair on arrival and end of session    Transfers Overall transfer level: Needs assistance   Transfers: Sit to/from Stand Sit to Stand: Supervision         General transfer comment: cues for hand placement/safety  Ambulation/Gait Ambulation/Gait assistance: Min guard Gait Distance (Feet): 50 Feet Assistive device: Rolling walker (2 wheeled) Gait Pattern/deviations: Step-through pattern;Decreased stride length;Trunk flexed   Gait velocity interpretation: <1.8 ft/sec, indicate of risk for recurrent falls General Gait Details: cues for posture, safety and position in RW. pt with kyphotic trunk and needs a youth walker   Stairs Stairs: Yes Stairs assistance: Min assist Stair Management: One rail Left Number of Stairs: 5 General stair comments: pt able to ascend stairs with rail on left and HHA on right with cues for sequence and safety, min assist for HHA   Wheelchair Mobility    Modified Rankin (Stroke Patients Only)       Balance Overall balance assessment: Needs assistance   Sitting  balance-Leahy Scale: Good     Standing balance support: Bilateral upper extremity supported Standing balance-Leahy Scale: Poor Standing balance comment: reliant on RW for gait                            Cognition Arousal/Alertness: Awake/alert Behavior During Therapy: WFL for tasks assessed/performed Overall Cognitive Status: Within Functional Limits for tasks assessed                                        Exercises Total Joint Exercises Hip ABduction/ADduction: AAROM;Right;Seated;15 reps Long Arc Quad: AROM;Right;Seated;15 reps Marching in Standing: AROM;Right;Seated;15 reps    General Comments        Pertinent Vitals/Pain Pain Score: 2  Pain Location: rt hip Pain Descriptors / Indicators: Aching Pain Intervention(s): Limited activity within patient's tolerance;Repositioned    Home Living                      Prior Function            PT Goals (current goals can now be found in the care plan section) Progress towards PT goals: Progressing toward goals    Frequency    7X/week      PT Plan Current plan remains appropriate    Co-evaluation              AM-PAC PT "6 Clicks" Mobility   Outcome Measure  Help  needed turning from your back to your side while in a flat bed without using bedrails?: A Little Help needed moving from lying on your back to sitting on the side of a flat bed without using bedrails?: A Little Help needed moving to and from a bed to a chair (including a wheelchair)?: A Little Help needed standing up from a chair using your arms (e.g., wheelchair or bedside chair)?: A Little Help needed to walk in hospital room?: A Little Help needed climbing 3-5 steps with a railing? : A Little 6 Click Score: 18    End of Session   Activity Tolerance: Patient tolerated treatment well Patient left: in chair;with call bell/phone within reach;with family/visitor present Nurse Communication: Mobility status PT  Visit Diagnosis: Other abnormalities of gait and mobility (R26.89);Difficulty in walking, not elsewhere classified (R26.2)     Time: 3536-1443 PT Time Calculation (min) (ACUTE ONLY): 16 min  Charges:  $Gait Training: 8-22 mins                     [name], PT Acute Rehabilitation Services Pager: [phone] Office: [phone]    Gabby Rackers B Larenda Reedy 12/15/2020, 11:55 AM

## 2020-12-15 NOTE — Progress Notes (Signed)
Physical Therapy Treatment Patient Details Name: Kelly Mcdowell MRN: [id number] DOB: [birth year] Today's Date: 12/15/2020    History of Present Illness Pt adm 6/13 for Rt THA direct anterior approach. PMH - arthritis, osteoporosis, cataract    PT Comments    Pt with slow improvement with mobility stating she remains nauseated and hasn't been able to eat but has received pain medication and educated for importance of not havin pain medicine on an empty stomach. Pt with very kyphotic posture in standing also limiting her ability to off weight RLE with bil UE. Pt educated for HEP, progression and plan with encouragement to continue to mobilize with nursing staff.     Follow Up Recommendations  Follow surgeon's recommendation for DC plan and follow-up therapies     Equipment Recommendations  Rolling walker with 5" wheels (youth RW)    Recommendations for Other Services       Precautions / Restrictions Precautions Precautions: Fall Restrictions Weight Bearing Restrictions: No    Mobility  Bed Mobility Overal bed mobility: Modified Independent             General bed mobility comments: increased time and effort to exit to right off of flat bed    Transfers Overall transfer level: Needs assistance   Transfers: Sit to/from Stand Sit to Stand: Supervision         General transfer comment: cues for hand placement with pt able to stand from bed and toilet  Ambulation/Gait Ambulation/Gait assistance: Min guard Gait Distance (Feet): 40 Feet Assistive device: Rolling walker (2 wheeled) Gait Pattern/deviations: Step-through pattern;Decreased stride length;Trunk flexed   Gait velocity interpretation: <1.8 ft/sec, indicate of risk for recurrent falls General Gait Details: cues for posture, safety, position in RW and increased distance. Pt walked 12' then 25' with pt limited by pain, fatigue and nausea without emesis   Stairs             Wheelchair Mobility     Modified Rankin (Stroke Patients Only)       Balance Overall balance assessment: Needs assistance   Sitting balance-Leahy Scale: Good     Standing balance support: Bilateral upper extremity supported Standing balance-Leahy Scale: Poor Standing balance comment: reliant on RW for gait                            Cognition Arousal/Alertness: Awake/alert Behavior During Therapy: WFL for tasks assessed/performed Overall Cognitive Status: Within Functional Limits for tasks assessed                                        Exercises Total Joint Exercises Hip ABduction/ADduction: AAROM;Right;Seated;10 reps Long Arc Quad: AROM;Right;Seated;10 reps Marching in Standing: AROM;Right;Seated;10 reps    General Comments        Pertinent Vitals/Pain Pain Score: 4  Pain Location: rt hip Pain Descriptors / Indicators: Guarding;Aching Pain Intervention(s): Limited activity within patient's tolerance;Monitored during session;Repositioned    Home Living                      Prior Function            PT Goals (current goals can now be found in the care plan section) Progress towards PT goals: Progressing toward goals    Frequency    7X/week      PT Plan Current plan remains appropriate  Co-evaluation              AM-PAC PT "6 Clicks" Mobility   Outcome Measure  Help needed turning from your back to your side while in a flat bed without using bedrails?: A Little Help needed moving from lying on your back to sitting on the side of a flat bed without using bedrails?: A Little Help needed moving to and from a bed to a chair (including a wheelchair)?: A Little Help needed standing up from a chair using your arms (e.g., wheelchair or bedside chair)?: A Little Help needed to walk in hospital room?: A Little Help needed climbing 3-5 steps with a railing? : A Lot 6 Click Score: 17    End of Session   Activity Tolerance: Patient  tolerated treatment well Patient left: in chair;with call bell/phone within reach Nurse Communication: Mobility status PT Visit Diagnosis: Other abnormalities of gait and mobility (R26.89);Difficulty in walking, not elsewhere classified (R26.2)     Time: 4098-1191 PT Time Calculation (min) (ACUTE ONLY): 20 min  Charges:  $Gait Training: 8-22 mins                     [name], PT Acute Rehabilitation Services Pager: [phone] Office: [phone]    [name] [name] 12/15/2020, 9:14 AM

## 2020-12-15 NOTE — Discharge Summary (Signed)
Patient ID: Kelly Mcdowell MRN: [id number] DOB/AGE: [birth year] 80 y.o.  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 female who was admitted 12/14/2020 for operative treatment ofPrimary osteoarthritis of right hip. Patient has severe unremitting pain that affects sleep, daily activities, and work/hobbies. After pre-op clearance the patient was taken to the operating room on 12/14/2020 and underwent  Procedure(s): RIGHT TOTAL HIP ARTHROPLASTY ANTERIOR APPROACH.    Patient was given perioperative antibiotics:  Anti-infectives (From admission, onward)    Start     Dose/Rate Route Frequency Ordered Stop   12/14/20 1800  ceFAZolin (ANCEF) IVPB 2g/100 mL premix        2 g 200 mL/hr over 30 Minutes Intravenous Every 6 hours 12/14/20 1414 12/15/20 0752   12/14/20 1136  vancomycin (VANCOCIN) powder  Status:  Discontinued          As needed 12/14/20 1136 12/14/20 1335   12/14/20 1106  ceFAZolin (ANCEF) 2-4 GM/100ML-% IVPB       Note to Pharmacy: Hoefler, Sierra   : cabinet override      12/14/20 1106 12/14/20 1232   12/14/20 1100  ceFAZolin (ANCEF) IVPB 2g/100 mL premix        2 g 200 mL/hr over 30 Minutes Intravenous On call to O.R. 12/14/20 1051 12/14/20 1153        Patient was given sequential compression devices, early ambulation, and chemoprophylaxis to prevent  DVT.  Patient benefited maximally from hospital stay and there were no complications.    Recent vital signs: Patient Vitals for the past 24 hrs:  BP Temp Temp src Pulse Resp SpO2 Height Weight  12/15/20 0714 (!) 144/70 97.9 F (36.6 C) Oral 70 18 100 % -- --  12/15/20 0427 (!) 143/57 (!) 97.5 F (36.4 C) Oral 77 18 100 % -- --  12/14/20 2310 134/64 97.9 F (36.6 C) -- (!) 59 18 100 % -- --  12/14/20 1935 (!) 136/56 97.8 F (36.6 C) -- (!) 53 18 98 % -- --  12/14/20 1551 137/64 97.9 F (36.6 C) -- (!) 53 20 98 % -- --  12/14/20 1525 121/79 97.7 F (36.5 C) -- 63 20 94 % -- --  12/14/20 1510 (!) 140/59 -- -- 86 20 97 % -- --  12/14/20 1500 -- -- -- -- -- 97 % -- --  12/14/20 1455 (!) 134/53 -- -- (!) 56 16 100 % -- --  12/14/20 1440 (!) 132/59 -- -- 63 20 100 % -- --  12/14/20 1425 (!) 117/57 -- -- (!) 47 13 100 % -- --  12/14/20 1410 (!) 127/45 -- -- (!) 52 17 100 % -- --  12/14/20 1355 (!) 115/59 -- -- (!) 51 17 100 % -- --  12/14/20 1340 (!) 119/55 (!) 97 F (  36.1 C) -- 65 19 100 % -- --  12/14/20 1105 (!) 155/59 98.3 F (36.8 C) Oral 69 18 98 % 5\' 2"  (1.575 m) 54.9 kg     Recent laboratory studies:  Recent Labs    12/15/20 0634  WBC 10.6*  HGB 12.3  HCT 36.8  PLT 215  NA 135  K 3.4*  CL 100  CO2 26  BUN 14  CREATININE 0.71  GLUCOSE 154*  CALCIUM 8.6*     Discharge Medications:   Allergies as of 12/15/2020   Not on File      Medication List     STOP taking these medications    diclofenac Sodium 1 % Gel Commonly known as: Voltaren   ibuprofen 400 MG tablet Commonly known as: ADVIL       TAKE these medications    aspirin EC 81 MG tablet Take 1 tablet (81 mg total) by mouth daily. Take once daily x 6 weeks after surgery IF still only taking one eliquis instead of 2.   Biofreeze Roll-On 4 % Gel Generic drug: Menthol (Topical Analgesic) Apply 1 application topically daily as needed (pain).   cholecalciferol 25 MCG (1000 UNIT) tablet Commonly  known as: VITAMIN D3 Take 2,000 Units by mouth daily.   docusate sodium 100 MG capsule Commonly known as: Colace Take 1 capsule (100 mg total) by mouth daily as needed.   famotidine 20 MG tablet Commonly known as: PEPCID Take 20 mg by mouth daily as needed for heartburn or indigestion. What changed: Another medication with the same name was removed. Continue taking this medication, and follow the directions you see here.   methocarbamol 500 MG tablet Commonly known as: Robaxin Take 1 tablet (500 mg total) by mouth 2 (two) times daily as needed. To be taken after surgery   ondansetron 4 MG tablet Commonly known as: Zofran Take 1 tablet (4 mg total) by mouth every 8 (eight) hours as needed for nausea or vomiting.   oxyCODONE-acetaminophen 5-325 MG tablet Commonly known as: Percocet Take 1-2 tablets by mouth every 6 (six) hours as needed. To be taken after surgery       ASK your doctor about these medications    apixaban 5 MG Tabs tablet Commonly known as: ELIQUIS Take 1 tablet (5 mg total) by mouth 2 (two) times daily.   hydrOXYzine 25 MG tablet Commonly known as: ATARAX/VISTARIL Take 1/2 tablet to one whole tablet by mouth daily as needed for itching   levothyroxine 88 MCG tablet Commonly known as: SYNTHROID Take 1 tablet (88 mcg total) by mouth daily before breakfast. 4 days per week - alternating with 1/2 tablet on other 3 days.               Durable Medical Equipment  (From admission, onward)           Start     Ordered   12/14/20 1541  DME Walker rolling  Once       Question:  Patient needs a walker to treat with the following condition  Answer:  History of hip replacement   12/14/20 1540   12/14/20 1541  DME 3 n 1  Once        12/14/20 1540   12/14/20 1541  DME Bedside commode  Once       Question:  Patient needs a bedside commode to treat with the following condition  Answer:  History of hip replacement   12/14/20 1540  Diagnostic  Studies: DG Chest 2 View  Result Date: 12/11/2020 CLINICAL DATA:  Preoperative examination. Patient for hip replacement. EXAM: CHEST - 2 VIEW COMPARISON:  None. FINDINGS: The chest is hyperexpanded. Lungs are clear. Heart size is normal. No pneumothorax or pleural effusion. No acute or focal bony abnormality. Aortic atherosclerosis noted. IMPRESSION: No acute disease. Pulmonary hyperexpansion suggestive of COPD. Aortic Atherosclerosis (ICD10-I70.0). Electronically Signed   By: Drusilla Kanner M.D.   On: 12/11/2020 13:54   DG Pelvis Portable  Result Date: 12/14/2020 CLINICAL DATA:  Postop right hip replacement EXAM: PORTABLE PELVIS 1-2 VIEWS COMPARISON:  09/22/2020 FINDINGS: Right hip replacement in satisfactory position alignment. No fracture or complication. IMPRESSION: Satisfactory right hip replacement. Electronically Signed   By: Marlan Palau M.D.   On: 12/14/2020 14:17   DG C-Arm 1-60 Min  Result Date: 12/14/2020 CLINICAL DATA:  Surgery, elective. Additional history provided: Right hip arthroplasty with anterior approach. Provided fluoroscopy time 18 seconds (1.73 mGy). EXAM: OPERATIVE right HIP (WITH PELVIS IF PERFORMED) 3 VIEWS TECHNIQUE: Fluoroscopic spot image(s) were submitted for interpretation post-operatively. COMPARISON:  Hip radiographs 09/22/2020. FINDINGS: Three intraoperative fluoroscopic images of the right hip are submitted. On the provided images, there are findings of interval right total hip arthroplasty. The femoral and acetabular components appear well seated. No unexpected finding on the provided views. IMPRESSION: Three intraoperative fluoroscopic images of the right hip from right total hip arthroplasty, as described. Electronically Signed   By: Jackey Loge DO   On: 12/14/2020 15:10   DG HIP OPERATIVE UNILAT WITH PELVIS RIGHT  Result Date: 12/14/2020 CLINICAL DATA:  Surgery, elective. Additional history provided: Right hip arthroplasty with anterior approach. Provided  fluoroscopy time 18 seconds (1.73 mGy). EXAM: OPERATIVE right HIP (WITH PELVIS IF PERFORMED) 3 VIEWS TECHNIQUE: Fluoroscopic spot image(s) were submitted for interpretation post-operatively. COMPARISON:  Hip radiographs 09/22/2020. FINDINGS: Three intraoperative fluoroscopic images of the right hip are submitted. On the provided images, there are findings of interval right total hip arthroplasty. The femoral and acetabular components appear well seated. No unexpected finding on the provided views. IMPRESSION: Three intraoperative fluoroscopic images of the right hip from right total hip arthroplasty, as described. Electronically Signed   By: Jackey Loge DO   On: 12/14/2020 15:10    Disposition: Discharge disposition: 01-Home or Self Care          Follow-up Information     Tarry Kos, MD. Go on 12/29/2020.   Specialty: Orthopedic Surgery Why: at 10:00 am for your two week post op appointment with Dr. Clarisa Kindred information: [address] [place] [address] [phone]                  Signed: Cristie Hem 12/15/2020, 8:18 AM

## 2020-12-15 NOTE — Progress Notes (Signed)
Patient is discharged from room 3C10 at this time. Alert and in stable condition. IV site d/c'd and instructions read to patient and spouse with understanding verbalized and all questions answered. Left unit via wheelchair with all belongings at side. 

## 2020-12-15 NOTE — Progress Notes (Signed)
Subjective: 1 Day Post-Op Procedure(s) (LRB): RIGHT TOTAL HIP ARTHROPLASTY ANTERIOR APPROACH (Right) Patient reports pain as mild.  C/o nausea/vomiting overnight.  Otherwise, no complaints  Objective: Vital signs in last 24 hours: Temp:  [97 F (36.1 C)-98.3 F (36.8 C)] 97.9 F (36.6 C) (06/14 0714) Pulse Rate:  [47-86] 70 (06/14 0714) Resp:  [13-20] 18 (06/14 0714) BP: (115-155)/(45-79) 144/70 (06/14 0714) SpO2:  [94 %-100 %] 100 % (06/14 0714) Weight:  [54.9 kg] 54.9 kg (06/13 1105)  Intake/Output from previous day: 06/13 0701 - 06/14 0700 In: 700 [I.V.:700] Out: 425 [Urine:275; Blood:150] Intake/Output this shift: No intake/output data recorded.  Recent Labs    12/15/20 0634  HGB 12.3   Recent Labs    12/15/20 0634  WBC 10.6*  RBC 4.07  HCT 36.8  PLT 215   Recent Labs    12/15/20 0634  NA 135  K 3.4*  CL 100  CO2 26  BUN 14  CREATININE 0.71  GLUCOSE 154*  CALCIUM 8.6*   No results for input(s): LABPT, INR in the last 72 hours.  Neurologically intact Neurovascular intact Sensation intact distally Intact pulses distally Dorsiflexion/Plantar flexion intact Incision: dressing C/D/I No cellulitis present Compartment soft   Assessment/Plan: 1 Day Post-Op Procedure(s) (LRB): RIGHT TOTAL HIP ARTHROPLASTY ANTERIOR APPROACH (Right) Up with therapy WBAT RLE May d/c after second PT session as long as she continues to mobilizes D/c rx called in to pharmacy        Cristie Hem 12/15/2020, 8:15 AM

## 2020-12-16 ENCOUNTER — Telehealth: Payer: Self-pay | Admitting: *Deleted

## 2020-12-16 DIAGNOSIS — I38 Endocarditis, valve unspecified: Secondary | ICD-10-CM | POA: Diagnosis not present

## 2020-12-16 DIAGNOSIS — Z7901 Long term (current) use of anticoagulants: Secondary | ICD-10-CM | POA: Diagnosis not present

## 2020-12-16 DIAGNOSIS — Z87891 Personal history of nicotine dependence: Secondary | ICD-10-CM | POA: Diagnosis not present

## 2020-12-16 DIAGNOSIS — E039 Hypothyroidism, unspecified: Secondary | ICD-10-CM | POA: Diagnosis not present

## 2020-12-16 DIAGNOSIS — M810 Age-related osteoporosis without current pathological fracture: Secondary | ICD-10-CM | POA: Diagnosis not present

## 2020-12-16 DIAGNOSIS — J45909 Unspecified asthma, uncomplicated: Secondary | ICD-10-CM | POA: Diagnosis not present

## 2020-12-16 DIAGNOSIS — H269 Unspecified cataract: Secondary | ICD-10-CM | POA: Diagnosis not present

## 2020-12-16 DIAGNOSIS — Z96641 Presence of right artificial hip joint: Secondary | ICD-10-CM | POA: Diagnosis not present

## 2020-12-16 DIAGNOSIS — I4891 Unspecified atrial fibrillation: Secondary | ICD-10-CM | POA: Diagnosis not present

## 2020-12-16 DIAGNOSIS — Z471 Aftercare following joint replacement surgery: Secondary | ICD-10-CM | POA: Diagnosis not present

## 2020-12-16 NOTE — Telephone Encounter (Signed)
Attempted 2nd time today to reach patient for D/C call. Did reach her daughter earlier who said patient was working with HHPT that was in the home. Left VM requesting call back.

## 2020-12-17 ENCOUNTER — Telehealth: Payer: Self-pay | Admitting: *Deleted

## 2020-12-17 NOTE — Telephone Encounter (Signed)
Ortho bundle D/C call completed. 

## 2020-12-18 DIAGNOSIS — I38 Endocarditis, valve unspecified: Secondary | ICD-10-CM | POA: Diagnosis not present

## 2020-12-18 DIAGNOSIS — Z471 Aftercare following joint replacement surgery: Secondary | ICD-10-CM | POA: Diagnosis not present

## 2020-12-18 DIAGNOSIS — J45909 Unspecified asthma, uncomplicated: Secondary | ICD-10-CM | POA: Diagnosis not present

## 2020-12-18 DIAGNOSIS — I4891 Unspecified atrial fibrillation: Secondary | ICD-10-CM | POA: Diagnosis not present

## 2020-12-18 DIAGNOSIS — E039 Hypothyroidism, unspecified: Secondary | ICD-10-CM | POA: Diagnosis not present

## 2020-12-18 DIAGNOSIS — M810 Age-related osteoporosis without current pathological fracture: Secondary | ICD-10-CM | POA: Diagnosis not present

## 2020-12-21 DIAGNOSIS — I38 Endocarditis, valve unspecified: Secondary | ICD-10-CM | POA: Diagnosis not present

## 2020-12-21 DIAGNOSIS — J45909 Unspecified asthma, uncomplicated: Secondary | ICD-10-CM | POA: Diagnosis not present

## 2020-12-21 DIAGNOSIS — I4891 Unspecified atrial fibrillation: Secondary | ICD-10-CM | POA: Diagnosis not present

## 2020-12-21 DIAGNOSIS — M810 Age-related osteoporosis without current pathological fracture: Secondary | ICD-10-CM | POA: Diagnosis not present

## 2020-12-21 DIAGNOSIS — Z471 Aftercare following joint replacement surgery: Secondary | ICD-10-CM | POA: Diagnosis not present

## 2020-12-21 DIAGNOSIS — E039 Hypothyroidism, unspecified: Secondary | ICD-10-CM | POA: Diagnosis not present

## 2020-12-22 ENCOUNTER — Telehealth: Payer: Self-pay | Admitting: *Deleted

## 2020-12-22 NOTE — Telephone Encounter (Signed)
Ortho bundle 7 day call completed. 

## 2020-12-23 DIAGNOSIS — E039 Hypothyroidism, unspecified: Secondary | ICD-10-CM | POA: Diagnosis not present

## 2020-12-23 DIAGNOSIS — Z471 Aftercare following joint replacement surgery: Secondary | ICD-10-CM | POA: Diagnosis not present

## 2020-12-23 DIAGNOSIS — I4891 Unspecified atrial fibrillation: Secondary | ICD-10-CM | POA: Diagnosis not present

## 2020-12-23 DIAGNOSIS — J45909 Unspecified asthma, uncomplicated: Secondary | ICD-10-CM | POA: Diagnosis not present

## 2020-12-23 DIAGNOSIS — M810 Age-related osteoporosis without current pathological fracture: Secondary | ICD-10-CM | POA: Diagnosis not present

## 2020-12-23 DIAGNOSIS — I38 Endocarditis, valve unspecified: Secondary | ICD-10-CM | POA: Diagnosis not present

## 2020-12-29 ENCOUNTER — Encounter: Payer: Self-pay | Admitting: Orthopaedic Surgery

## 2020-12-29 ENCOUNTER — Ambulatory Visit (INDEPENDENT_AMBULATORY_CARE_PROVIDER_SITE_OTHER): Payer: Medicare Other | Admitting: Orthopaedic Surgery

## 2020-12-29 ENCOUNTER — Telehealth: Payer: Self-pay | Admitting: *Deleted

## 2020-12-29 ENCOUNTER — Other Ambulatory Visit: Payer: Self-pay

## 2020-12-29 DIAGNOSIS — Z96641 Presence of right artificial hip joint: Secondary | ICD-10-CM

## 2020-12-29 MED ORDER — OXYCODONE-ACETAMINOPHEN 5-325 MG PO TABS: 1.0000 | ORAL_TABLET | Freq: Every day | ORAL | 0 refills | Status: DC | PRN

## 2020-12-29 NOTE — Telephone Encounter (Signed)
Ortho bundle 14 day in office meeting completed. °

## 2020-12-29 NOTE — Progress Notes (Signed)
Post-Op Visit Note   Patient: Kelly Mcdowell           Date of Birth: [birth year]           MRN: [id number] Visit Date: 12/29/2020 PCP: [name], MD   Assessment & Plan:  Chief Complaint:  Chief Complaint  Patient presents with   Right Hip - Routine Post Op   Visit Diagnoses:  1. Status post total replacement of right hip     Plan: Kelly Mcdowell is a 80 y.o. female who follows up 2 weeks status post right total hip arthroplasty (DOS 12/14/20). She is doing well today. She has been staying with her daughter. Ambulating with a walker. Her pain is well controlled taking Percocet every 6 hours and Robaxin. She denies fevers, chills or night sweats. She has been working with home therapy, her last visit is tomorrow.   On exam her surgical incision is clean, dry and intact. No erythema, purulence or signs of infection. No pain with hip ROM. 5/5 hip flexion strength. Distal neurovascular exam intact.   Sutures were removed today and Steri-strips applied. At this point she can get her incision wet in the shower but avoid soaking the incision for the next few weeks. Refill for Percocet provided today. Continue taking Eliquis and ASA. We will see her back in 4 weeks with radiographs of the right hip.  Dental prophylaxis reinforced.  Follow-Up Instructions: Return in about 4 weeks (around 01/26/2021).   Orders:  No orders of the defined types were placed in this encounter.  Meds ordered this encounter  Medications   oxyCODONE-acetaminophen (PERCOCET) 5-325 MG tablet    Sig: Take 1-2 tablets by mouth daily as needed. To be taken after surgery    Dispense:  20 tablet    Refill:  0    Imaging: No results found.  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/14/2020   Primary osteoarthritis of right hip 09/29/2020   Primary osteoarthritis of left hip 09/29/2020   Educated about COVID-19 virus infection  02/20/2020   Atrial fibrillation (HCC) 02/20/2020   Age-related osteoporosis without current pathological fracture 03/04/2019   Hypothyroidism 12/15/2016   Hip joint pain 09/16/2013   Lumbar pain with radiation down left leg 09/16/2013   Hypothyroidism (acquired) 06/18/2012   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   Thyroid disease    UTI (urinary tract infection)     Family History  Problem Relation Age of Onset   Congestive Heart Failure Father    Cancer Sister        breast   Breast cancer Sister    Colon polyps Sister    Cancer Maternal Grandmother    Breast cancer Maternal Aunt    Stomach cancer Maternal Aunt    Colon cancer Neg Hx    Esophageal cancer Neg Hx    Rectal cancer Neg Hx     Past Surgical History:  Procedure Laterality Date   CESAREAN SECTION     COLONOSCOPY     TOTAL HIP ARTHROPLASTY Right 12/14/2020   Procedure: RIGHT TOTAL HIP ARTHROPLASTY ANTERIOR APPROACH;  Surgeon: Tarry Kos, MD;  Location: [facility]  OR;  Service: Orthopedics;  Laterality: Right;  3-C   TUBAL LIGATION     Social History   Occupational History   Not on file  Tobacco Use   Smoking status: Former    Pack years: 0.00    Types: Cigarettes    Quit date: 03/04/2005    Years since quitting: 15.8   Smokeless tobacco: Never  Vaping Use   Vaping Use: Never used  Substance and Sexual Activity   Alcohol use: No   Drug use: No   Sexual activity: Never    Birth control/protection: Surgical

## 2020-12-30 DIAGNOSIS — Z471 Aftercare following joint replacement surgery: Secondary | ICD-10-CM | POA: Diagnosis not present

## 2020-12-30 DIAGNOSIS — I38 Endocarditis, valve unspecified: Secondary | ICD-10-CM | POA: Diagnosis not present

## 2020-12-30 DIAGNOSIS — J45909 Unspecified asthma, uncomplicated: Secondary | ICD-10-CM | POA: Diagnosis not present

## 2020-12-30 DIAGNOSIS — E039 Hypothyroidism, unspecified: Secondary | ICD-10-CM | POA: Diagnosis not present

## 2020-12-30 DIAGNOSIS — I4891 Unspecified atrial fibrillation: Secondary | ICD-10-CM | POA: Diagnosis not present

## 2020-12-30 DIAGNOSIS — M810 Age-related osteoporosis without current pathological fracture: Secondary | ICD-10-CM | POA: Diagnosis not present

## 2021-01-13 ENCOUNTER — Telehealth: Payer: Self-pay | Admitting: *Deleted

## 2021-01-13 ENCOUNTER — Other Ambulatory Visit: Payer: Self-pay | Admitting: Physician Assistant

## 2021-01-13 MED ORDER — METHOCARBAMOL 500 MG PO TABS: 500.0000 mg | ORAL_TABLET | Freq: Two times a day (BID) | ORAL | 0 refills | Status: DC | PRN

## 2021-01-13 NOTE — Telephone Encounter (Signed)
Np.  Sent in

## 2021-01-13 NOTE — Telephone Encounter (Signed)
Ortho bundle 30 day call to patient. Doing well, but has run out of muscle relaxer and would like refill. Feels tight in knee and thigh area. Could we refill? Thanks.

## 2021-01-26 ENCOUNTER — Ambulatory Visit (INDEPENDENT_AMBULATORY_CARE_PROVIDER_SITE_OTHER): Payer: Medicare Other

## 2021-01-26 ENCOUNTER — Other Ambulatory Visit: Payer: Self-pay

## 2021-01-26 ENCOUNTER — Ambulatory Visit (INDEPENDENT_AMBULATORY_CARE_PROVIDER_SITE_OTHER): Payer: Medicare Other | Admitting: Orthopaedic Surgery

## 2021-01-26 ENCOUNTER — Encounter: Payer: Self-pay | Admitting: Orthopaedic Surgery

## 2021-01-26 DIAGNOSIS — Z96641 Presence of right artificial hip joint: Secondary | ICD-10-CM

## 2021-01-26 NOTE — Progress Notes (Signed)
Post-Op Visit Note   Patient: Kelly Mcdowell           Date of Birth: 09-08-1940           MRN: [id number] Visit Date: 01/26/2021 PCP: [name], MD   Assessment & Plan:  Chief Complaint:  Chief Complaint  Patient presents with   Right Hip - Pain   Visit Diagnoses:  1. Status post total replacement of right hip     Plan: Kelly Mcdowell is 6 weeks status post right total hip replacement.  She has no complaints.  Ambulating with a walker.  She has some occasional pain and some numbness around the surgical scar but overall doing wel.  She is doing her own exercises at home.  Right hip scar is fully healed.  No signs of infection.  The x-rays show stable hip replacement.  Dental prophylaxis was reinforced.  Increase activity as tolerated.  We will see her back in 6 weeks for recheck.  She takes Eliquis at baseline.  Follow-Up Instructions: Return in about 6 weeks (around 03/09/2021).   Orders:  Orders Placed This Encounter  Procedures   XR HIP UNILAT W OR W/O PELVIS 2-3 VIEWS RIGHT   No orders of the defined types were placed in this encounter.   Imaging: XR HIP UNILAT W OR W/O PELVIS 2-3 VIEWS RIGHT  Result Date: 01/26/2021 Stable right total hip replacement without complication.   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/14/2020   Primary osteoarthritis of right hip 09/29/2020   Primary osteoarthritis of left hip 09/29/2020   Educated about COVID-19 virus infection 02/20/2020   Atrial fibrillation (HCC) 02/20/2020   Age-related osteoporosis without current pathological fracture 03/04/2019   Hypothyroidism 12/15/2016   Hip joint pain 09/16/2013   Lumbar pain with radiation down left leg 09/16/2013   Hypothyroidism (acquired) 06/18/2012   Past Medical History:  Diagnosis Date   Allergy    Arthritis    Cataract    both eyes   Dysrhythmia    afib   Hypothyroidism    Leaky heart valve     Osteoporosis    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   Thyroid disease    UTI (urinary tract infection)     Family History  Problem Relation Age of Onset   Congestive Heart Failure Father    Cancer Sister        breast   Breast cancer Sister    Colon polyps Sister    Cancer Maternal Grandmother    Breast cancer Maternal Aunt    Stomach cancer Maternal Aunt    Colon cancer Neg Hx    Esophageal cancer Neg Hx    Rectal cancer Neg Hx     Past Surgical History:  Procedure Laterality Date   CESAREAN SECTION     COLONOSCOPY     TOTAL HIP ARTHROPLASTY Right 12/14/2020   Procedure: RIGHT TOTAL HIP ARTHROPLASTY ANTERIOR APPROACH;  Surgeon: Tarry Kos, MD;  Location: [facility] OR;  Service: Orthopedics;  Laterality: Right;  3-C   TUBAL LIGATION     Social History   Occupational History   Not on file  Tobacco Use   Smoking status: Former    Types: Cigarettes    Quit date: 03/04/2005    Years since quitting: 15.9   Smokeless tobacco: Never  Vaping Use   Vaping Use: Never used  Substance and Sexual Activity  Alcohol use: No   Drug use: No   Sexual activity: Never    Birth control/protection: Surgical

## 2021-02-01 ENCOUNTER — Telehealth: Payer: Self-pay | Admitting: Cardiology

## 2021-02-01 DIAGNOSIS — I4891 Unspecified atrial fibrillation: Secondary | ICD-10-CM

## 2021-02-01 NOTE — Telephone Encounter (Signed)
*  STAT* If patient is at the pharmacy, call can be transferred to refill team.   1. Which medications need to be refilled? (please list name of each medication and dose if known) Xarelto  2. Which pharmacy/location (including street and city if local pharmacy) is medication to be sent to? [id number] - [place] ([place]), [place] - [address]  3. Do they need a 30 day or 90 day supply?  90 day supply

## 2021-02-02 NOTE — Telephone Encounter (Signed)
Pt requesting refill of xarelto when their med list stated that they were on eliquis. I will route to pharmd pool for clarification.

## 2021-02-02 NOTE — Telephone Encounter (Signed)
I called the pt back who stated that at her last visit w/hochrein they mentioned that she wanted to go on xarelto due to cost issues.l this will need to be reviewed by a pharmacist

## 2021-02-08 NOTE — Telephone Encounter (Signed)
Im not sure if Xarelto would be cheaper, but if it is im ok with switching. I will verify with Dr. Antoine Poche.

## 2021-02-09 MED ORDER — RIVAROXABAN 20 MG PO TABS: 20.0000 mg | ORAL_TABLET | Freq: Every day | ORAL | 2 refills | Status: DC

## 2021-02-09 NOTE — Telephone Encounter (Signed)
Spoke with patient over the phone.  Had been taking Eliquis only once daily to try to stretch out her supply since her copay is >500 dollars.  Ran out of Eliquis last week and has been taking aspirin 81mg  daily.  Does not think she has a deductible or is in donut hole.  Recommended she apply for patient assistance, however she thinks her husband makes too much money from social security to qualify.  Her [employer] plan recommended she switch to Xarelto or warfarin.  Discussed warfarin with patient and she reports her PCP does not want her to start it due to frequent monitoring.  Requests Rx for Xarelto to see if prescription cost will be cheaper.

## 2021-02-09 NOTE — Telephone Encounter (Signed)
I called and spoke w/Kelly Mcdowell and stated that we are still currently awaiting an answer from doctor hochrein and I let her know that someone would be in touch as soon as they know and the Kelly Mcdowell voiced understanding. Kelly Mcdowell stated that they have been off their blood thinner for about a week. I will route to pharmd pool as not being on the thinner sounded urgent.

## 2021-02-12 ENCOUNTER — Other Ambulatory Visit: Payer: Self-pay

## 2021-02-12 ENCOUNTER — Telehealth: Payer: Self-pay

## 2021-02-12 ENCOUNTER — Encounter: Payer: Self-pay | Admitting: Family Medicine

## 2021-02-12 ENCOUNTER — Ambulatory Visit (INDEPENDENT_AMBULATORY_CARE_PROVIDER_SITE_OTHER): Payer: Medicare Other | Admitting: Family Medicine

## 2021-02-12 VITALS — BP 136/78 | HR 65 | Temp 98.1°F | Resp 17 | Ht 62.0 in | Wt 118.4 lb

## 2021-02-12 DIAGNOSIS — R239 Unspecified skin changes: Secondary | ICD-10-CM | POA: Diagnosis not present

## 2021-02-12 DIAGNOSIS — E039 Hypothyroidism, unspecified: Secondary | ICD-10-CM

## 2021-02-12 DIAGNOSIS — I4891 Unspecified atrial fibrillation: Secondary | ICD-10-CM

## 2021-02-12 LAB — TSH: TSH: 0.07 u[IU]/mL — ABNORMAL LOW (ref 0.35–5.50)

## 2021-02-12 MED ORDER — LEVOTHYROXINE SODIUM 88 MCG PO TABS: 88.0000 ug | ORAL_TABLET | Freq: Every day | ORAL | 2 refills | Status: DC

## 2021-02-12 NOTE — Patient Instructions (Signed)
No change in meds today.  Follow-up if any concerns otherwise I am happy to see you in 6 months for recheck labs.  If there are any concerns on today's labs I will let you know.  Thanks for coming in.

## 2021-02-12 NOTE — Progress Notes (Signed)
Subjective:  Patient ID: Kelly Mcdowell, female    DOB: July 21, 1940  Age: 80 y.o. MRN: [id number]  CC:  Chief Complaint  Patient presents with   Hypothyroidism    Pt here for 3 month check on her Thyroid, pt reports no concerns     HPI Kelly Mcdowell presents for   Hypothyroidism: Lab Results  Component Value Date   TSH 1.720 08/24/2020   Controlled on most recent testing in November and February.  Previous variability from hyper to hypothyroid.  Current regimen of levothyroxine 88 mcg 4 days/week alternating with half tablet on other 3 days. Taking medication daily.  No new hot or cold intolerance. No new hair or skin changes, heart palpitations or new fatigue. No new weight changes.   Doing better after hip replacement. Uses walking stick or walker at times.   Followed by cardiology for afib. On xarelto as of this week due to coverage. Looking into medication assistance for cost.   Fullness of skin at left elbow.  Noticed some possible prominence of the skin at the left inside of her elbow after hip surgery.  No pain, no redness, no nodules.  Has not changed in size.  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 09/16/2013   Hypothyroidism (acquired) 06/18/2012   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 Authorizing Provider  aspirin EC 81 MG tablet Take 1 tablet (81 mg total) by mouth daily. Take once daily x 6 weeks after surgery IF still only taking one eliquis instead of 2. 12/13/20  Yes Cristie Hem, PA-C  cholecalciferol (VITAMIN D3) 25 MCG (1000 UNIT) tablet Take 2,000 Units by mouth daily.   Yes [provider]  famotidine (PEPCID) 20 MG tablet Take 20 mg by mouth daily as needed for heartburn or indigestion.   Yes [provider]  hydrOXYzine (ATARAX/VISTARIL) 25 MG tablet Take 1/2 tablet to one whole tablet by mouth daily as needed for itching 05/30/17  Yes [name], MD  levothyroxine (SYNTHROID) 88 MCG tablet Take 1 tablet (88 mcg total) by mouth daily before breakfast. 4 days per week - alternating with 1/2 tablet on other 3 days. Patient taking differently: Take 44-88 mcg by mouth See admin instructions. Take 88 mcg by mouth every other day, alternating with 44  mcg on alternate days 08/28/20  Yes [name], MD  Menthol, Topical Analgesic, (BIOFREEZE ROLL-ON) 4 % GEL Apply 1 application topically daily as needed (pain).   Yes [provider]  rivaroxaban (XARELTO) 20 MG TABS tablet Take 1 tablet (20 mg total) by mouth daily with supper. 02/09/21  Yes Rollene Rotunda, MD  methocarbamol (ROBAXIN) 500 MG tablet Take 1 tablet (500 mg total) by mouth 2 (two) times daily as needed. To be taken after surgery Patient not taking: Reported on 02/12/2021 01/13/21   Cristie Hem, PA-C  ondansetron (ZOFRAN) 4 MG tablet Take 1 tablet (4 mg total) by mouth every 8 (eight)  hours as needed for nausea or vomiting. Patient not taking: Reported on 02/12/2021 12/07/20   Cristie Hem, PA-C  oxyCODONE-acetaminophen (PERCOCET) 5-325 MG tablet Take 1-2 tablets by mouth daily as needed. To be taken after surgery Patient not taking: Reported on 02/12/2021 12/29/20   Tarry Kos, MD   Social History   Socioeconomic History   Marital status: Married    Spouse name: Not on file   Number of children: Not on file   Years of education: Not on file   Highest education level: Not on file  Occupational History   Not on file  Tobacco Use   Smoking status: Former    Types: Cigarettes    Quit date: 03/04/2005    Years since quitting: 15.9   Smokeless tobacco: Never  Vaping Use   Vaping Use: Never used  Substance and Sexual Activity   Alcohol use: No   Drug use: No   Sexual activity: Never    Birth control/protection: Surgical  Other Topics Concern   Not on file  Social History Narrative   Married and lives with husband.  Three daughters.  12 grands.     Social Determinants of Health   Financial Resource Strain: Not on file  Food Insecurity: Not on file  Transportation Needs: Not on file  Physical Activity: Not on file  Stress: Not on file  Social Connections: Not on file  Intimate Partner Violence: Not on file    Review of Systems Per hPI  Objective:   Vitals:   02/12/21 1042  BP: 136/78  Pulse: 65  Resp: 17  Temp: 98.1 F (36.7 C)  TempSrc: Temporal  SpO2: 97%  Weight: 118 lb 6.4 oz (53.7 kg)  Height: 5\' 2"  (1.575 m)     Physical Exam Vitals reviewed.  Constitutional:      Appearance: Normal appearance. She is well-developed.  HENT:     Head: Normocephalic and atraumatic.  Eyes:     Conjunctiva/sclera: Conjunctivae normal.     Pupils: Pupils are equal, round, and reactive to light.  Neck:     Vascular: No carotid bruit.  Cardiovascular:     Rate and Rhythm: Normal rate and regular rhythm.     Heart sounds: Normal heart sounds.   Pulmonary:     Effort: Pulmonary effort is normal.     Breath sounds: Normal breath sounds.  Abdominal:     Palpations: Abdomen is soft. There is no pulsatile mass.     Tenderness: There is no abdominal tenderness.  Musculoskeletal:     Right lower leg: No edema.     Left lower leg: No edema.     Comments: Left elbow, full range of motion, no bony tenderness, possible faint prominence of the soft tissue medially at volar aspect but no nodules, no apparent cyst or mass.  Skin:    General: Skin is warm and dry.  Neurological:     Mental Status: She is alert and oriented to person, place, and time.  Psychiatric:        Mood and Affect: Mood normal.        Behavior: Behavior normal.       Assessment & Plan:  Kelly Mcdowell is a 80 y.o. female . Hypothyroidism, unspecified type - Plan: TSH, levothyroxine (SYNTHROID) 88 MCG tablet  -Tolerating current regimen, check TSH.  Refill same dose.  Atrial fibrillation, unspecified type (HCC)  -Continue follow-up with cardiology, including decisions on anticoagulation.  May need to look into medication assistance program for meds if cost prohibitive.  Skin change  -Possible prominence at the volar elbow soft tissue, potentially could have been from IV with surgery but very subtle findings on exam.  Nontender.  Continue monitoring for now for any changes, RTC precautions.  Meds ordered this encounter  Medications   levothyroxine (SYNTHROID) 88 MCG tablet    Sig: Take 1 tablet (88 mcg total) by mouth daily before breakfast. 4 days per week - alternating with 1/2 tablet on other 3 days.    Dispense:  90 tablet    Refill:  2   Patient Instructions  No change in meds today.  Follow-up if any concerns otherwise I am happy to see you in 6 months for recheck labs.  If there are any concerns on today's labs I will let you know.  Thanks for coming in.     Signed,   Meredith Staggers, MD  Primary Care, [facility] 02/12/21 1:25 PM

## 2021-02-16 NOTE — Telephone Encounter (Signed)
Error

## 2021-02-26 ENCOUNTER — Ambulatory Visit (INDEPENDENT_AMBULATORY_CARE_PROVIDER_SITE_OTHER): Payer: Medicare Other

## 2021-02-26 ENCOUNTER — Telehealth: Payer: Self-pay | Admitting: Cardiology

## 2021-02-26 ENCOUNTER — Other Ambulatory Visit: Payer: Self-pay

## 2021-02-26 ENCOUNTER — Ambulatory Visit
Admission: EM | Admit: 2021-02-26 | Discharge: 2021-02-26 | Disposition: A | Discharge status: Home / Self Care | Payer: Medicare Other | Attending: Internal Medicine | Admitting: Internal Medicine

## 2021-02-26 DIAGNOSIS — S60221A Contusion of right hand, initial encounter: Secondary | ICD-10-CM | POA: Diagnosis not present

## 2021-02-26 DIAGNOSIS — S6991XA Unspecified injury of right wrist, hand and finger(s), initial encounter: Secondary | ICD-10-CM

## 2021-02-26 DIAGNOSIS — R609 Edema, unspecified: Secondary | ICD-10-CM

## 2021-02-26 DIAGNOSIS — S6000XA Contusion of unspecified finger without damage to nail, initial encounter: Secondary | ICD-10-CM

## 2021-02-26 DIAGNOSIS — M19041 Primary osteoarthritis, right hand: Secondary | ICD-10-CM | POA: Diagnosis not present

## 2021-02-26 DIAGNOSIS — M25531 Pain in right wrist: Secondary | ICD-10-CM

## 2021-02-26 DIAGNOSIS — R600 Localized edema: Secondary | ICD-10-CM | POA: Diagnosis not present

## 2021-02-26 NOTE — Discharge Instructions (Addendum)
Please use cool compresses to affected area. Monitor area very closely and follow up at the hospital if symptoms worsen. Follow up with cardiologist today in regards to your blood thinner.

## 2021-02-26 NOTE — ED Provider Notes (Signed)
[facility]    CSN: [id number] Arrival date & time: 02/26/21  1524      History   Chief Complaint Chief Complaint  Patient presents with   right hand injury    HPI Kelly Mcdowell is a 80 y.o. female.   Patient presents with bruising and swelling to right dorsal surface of hand that has been present since last night.  Patient is not sure of any injury that occurred. Although, patient states that she was walking with her walker around the table and thinks that she may have hit her hand on the wooden table because pain started directly after that.  Denies any numbness or tingling to extremity.  Patient states that swelling has significantly improved since last night. Patient has photo on phone of appearance of hand last night.  Patient does take Xarelto. Patient called cardiology office today and was advised to come to urgent care for further evaluation and management.     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   Thyroid disease    UTI (urinary tract infection)     Patient Active Problem List   Diagnosis Date Noted   Status post total replacement of right hip 12/14/2020   Primary osteoarthritis of right hip 09/29/2020   Primary osteoarthritis of left hip 09/29/2020   Educated about COVID-19 virus infection 02/20/2020   Atrial fibrillation (HCC) 02/20/2020   Age-related osteoporosis without current pathological fracture 03/04/2019   Hypothyroidism 12/15/2016   Hip joint pain 09/16/2013   Lumbar pain with radiation down left leg 09/16/2013   Hypothyroidism (acquired) 06/18/2012    Past Surgical History:  Procedure Laterality Date   CESAREAN SECTION     COLONOSCOPY     TOTAL HIP ARTHROPLASTY Right 12/14/2020   Procedure: RIGHT TOTAL HIP ARTHROPLASTY ANTERIOR APPROACH;  Surgeon: Tarry Kos,  MD;  Location: [facility] OR;  Service: Orthopedics;  Laterality: Right;  3-C   TUBAL LIGATION      OB History   No obstetric history on file.      Home Medications    Prior to Admission medications   Medication Sig Start Date End Date Taking? Authorizing Provider  aspirin EC 81 MG tablet Take 1 tablet (81 mg total) by mouth daily. Take once daily x 6 weeks after surgery IF still only taking one eliquis instead of 2. 12/13/20   Cristie Hem, PA-C  cholecalciferol (VITAMIN D3) 25 MCG (1000 UNIT) tablet Take 2,000 Units by mouth daily.    [provider]  famotidine (PEPCID) 20 MG tablet Take 20 mg by mouth daily as needed for heartburn or indigestion.    [provider]  hydrOXYzine (ATARAX/VISTARIL) 25 MG tablet Take 1/2 tablet to one whole tablet by mouth daily as needed for itching 05/30/17   [name], MD  levothyroxine (SYNTHROID) 88 MCG tablet Take 1 tablet (88 mcg total) by mouth daily before breakfast. 4 days per week - alternating with 1/2 tablet on other 3 days. 02/12/21   [name], MD  Menthol, Topical Analgesic, (BIOFREEZE ROLL-ON) 4 % GEL Apply 1 application topically daily as needed (pain).    [provider]  rivaroxaban (XARELTO) 20 MG TABS tablet Take 1 tablet (20 mg total) by  mouth daily with supper. 02/09/21   Rollene Rotunda, MD    Family History Family History  Problem Relation Age of Onset   Congestive Heart Failure Father    Cancer Sister        breast   Breast cancer Sister    Colon polyps Sister    Cancer Maternal Grandmother    Breast cancer Maternal Aunt    Stomach cancer Maternal Aunt    Colon cancer Neg Hx    Esophageal cancer Neg Hx    Rectal cancer Neg Hx     Social History Social History   Tobacco Use   Smoking status: Former    Types: Cigarettes    Quit date: 03/04/2005    Years since quitting: 15.9   Smokeless tobacco: Never  Vaping Use   Vaping Use: Never used  Substance Use Topics   Alcohol use: No    Drug use: No     Allergies   Patient has no known allergies.   Review of Systems Review of Systems Per HPI  Physical Exam Triage Vital Signs ED Triage Vitals [02/26/21 1535]  Enc Vitals Group     BP 135/77     Pulse Rate 91     Resp 18     Temp (!) 97.4 F (36.3 C)     Temp Source Oral     SpO2 98 %     Weight      Height      Head Circumference      Peak Flow      Pain Score 0     Pain Loc      Pain Edu?      Excl. in GC?    No data found.  Updated Vital Signs BP 135/77 (BP Location: Right Arm)   Pulse 91   Temp (!) 97.4 F (36.3 C) (Oral)   Resp 18   SpO2 98%   Visual Acuity Right Eye Distance:   Left Eye Distance:   Bilateral Distance:    Right Eye Near:   Left Eye Near:    Bilateral Near:     Physical Exam Constitutional:      Appearance: Normal appearance.  HENT:     Head: Normocephalic and atraumatic.  Eyes:     Extraocular Movements: Extraocular movements intact.     Conjunctiva/sclera: Conjunctivae normal.  Pulmonary:     Effort: Pulmonary effort is normal.  Skin:    General: Skin is warm and dry.     Findings: Bruising and ecchymosis present. No abrasion, petechiae or wound.     Comments: Diffuse purple discoloration noted to entire dorsal surface of right hand that extends slightly in 2nd, 3rd, and 4th digits just past PIP joint. Mild swelling noted to 1st PIP joint. Neurovascular intact.   Neurological:     General: No focal deficit present.     Mental Status: She is alert and oriented to person, place, and time. Mental status is at baseline.  Psychiatric:        Mood and Affect: Mood normal.        Behavior: Behavior normal.        Thought Content: Thought content normal.        Judgment: Judgment normal.     UC Treatments / Results  Labs (all labs ordered are listed, but only abnormal results are displayed) Labs Reviewed - No data to display  EKG   Radiology DG Hand Complete Right  Result Date: 02/26/2021 CLINICAL  DATA:  Wrist edema EXAM: RIGHT HAND - COMPLETE 3+ VIEW COMPARISON:  None. FINDINGS: No fracture or malalignment. Moderate arthritis at the first [facility] joint and STT interval. Joint space narrowing and osteophyte at the D IP and PIP joints. Mild degenerative changes at the first second and third MCP joints. IMPRESSION: Arthritis.  No acute osseous abnormality. Electronically Signed   By: Jasmine Pang M.D.   On: 02/26/2021 16:09    Procedures Procedures (including critical care time)  Medications Ordered in UC Medications - No data to display  Initial Impression / Assessment and Plan / UC Course  I have reviewed the triage vital signs and the nursing notes.  Pertinent labs & imaging results that were available during my care of the patient were reviewed by me and considered in my medical decision making (see chart for details).     Right hand x-ray was negative for any fracture or acute bony abnormality.  In comparison to the patient's photo on phone of appearance of hand last night with current physical exam, bruising and swelling has significantly improved.  Right upper extremity is also neurovascularly intact.  Do not think the patient needs immediate medical attention at the hospital at this time.  Patient advised to follow-up with cardiology in regards to the possible need of pausing Xarelto for a few days. Discussed strict return precautions. Patient verbalized understanding and is agreeable with plan.  Final Clinical Impressions(s) / UC Diagnoses   Final diagnoses:  Hand injury, right, initial encounter  Contusion of right hand including fingers, initial encounter     Discharge Instructions      Please use cool compresses to affected area. Monitor area very closely and follow up at the hospital if symptoms worsen. Follow up with cardiologist today in regards to your blood thinner.      ED Prescriptions   None    PDMP not reviewed this encounter.   Lance Muss,  FNP 02/26/21 [phone]

## 2021-02-26 NOTE — ED Triage Notes (Signed)
Pt c/o bruise and swelling to right hand. States she noticed it first last night but does not remember any physical injury to the area. She is concerned b/c she is on blood thinners and he heart provider recommended she be seen.

## 2021-02-26 NOTE — Telephone Encounter (Signed)
Returned call to patient who states that she hit her Right  hand last night and a small bruise and knot appeared. Patient states this morning it is much worse and bruising has spread down her fingers on her right hand and states that it is very swollen all over and "looks terrible". Patient states that she has had bruising before since has been on blood thinners but states that she has never had any this bad. Patient states her sensation, and temperature are both good. Patient also states that she feels since she started Xarelto about a month ago she feels her palpitations have worsened. Patient states they are infrequent but states she just feels them more often. Patient denies any symptoms at this time. Patient did have recent TSH at PCP which was low at 0.07. Patient states she saw her results on MyChart but was not told to make any changes to her Synthroid. Patient will follow up with her PCP regarding this to make sure. Advised patient to place a cold pack or ice on hand to see if that helps with swelling. Advised patient if it continues to worsen she could consider seeing urgent care or her PCP to have her hand looked at. Advised patient I would forward message to Dr. Antoine Poche and PharmD to advise on any recommendations for her regarding her issues. Patient verbalized understanding.

## 2021-02-26 NOTE — Telephone Encounter (Signed)
Rollene Rotunda, MD  You 1 hour ago (12:38 PM)   I agree that I would have her see urgent care or her primary provider.  She certainly could have her anticoagulation held if they thought it was necessary.    Made patient aware of Dr. Jenene Slicker recommendations and verbalized understanding. Patient will see Urgent care or PCP to have her hand looked at. Advised patient to call back to office with any issues, questions, or concerns. Patient verbalized understanding.

## 2021-02-26 NOTE — Telephone Encounter (Signed)
hit hand on something and busted a blood vessel, had is swollen and pt is on blood thinners.. please advise

## 2021-03-09 ENCOUNTER — Encounter: Payer: Self-pay | Admitting: Orthopaedic Surgery

## 2021-03-09 ENCOUNTER — Ambulatory Visit (INDEPENDENT_AMBULATORY_CARE_PROVIDER_SITE_OTHER): Payer: Medicare Other | Admitting: Physician Assistant

## 2021-03-09 ENCOUNTER — Other Ambulatory Visit: Payer: Self-pay

## 2021-03-09 VITALS — Ht 62.0 in | Wt 118.0 lb

## 2021-03-09 DIAGNOSIS — Z96641 Presence of right artificial hip joint: Secondary | ICD-10-CM

## 2021-03-09 NOTE — Progress Notes (Signed)
Post-Op Visit Note   Patient: Kelly Mcdowell           Date of Birth: 1940/09/04           MRN: [id number] Visit Date: 03/09/2021 PCP: [name], MD   Assessment & Plan:  Chief Complaint:  Chief Complaint  Patient presents with   Right Hip - Follow-up    12/14/2020 Right THA   Visit Diagnoses:  1. History of total right hip replacement     Plan: Patient is a pleasant 80 year old female who comes in today 3 months out right total hip replacement 12/14/2020.  She has been doing well.  She still notes some stiffness if she sits down for any length of time but overall this is improved.  Examination of her right hip reveals near full hip flexion.  She still lacks a little bit with strength.  Painless logroll.  She is neurovascular intact distally.  At this point, she will continue to advance with activity as tolerated.  Dental prophylaxis reinforced.  Follow-up in 6 months time for repeat evaluation and AP pelvis x-rays.  Call with concerns or questions.  Follow-Up Instructions: Return in about 6 months (around 09/06/2021).   Orders:  No orders of the defined types were placed in this encounter.  No orders of the defined types were placed in this encounter.   Imaging: No new imaging  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/14/2020   Primary osteoarthritis of right hip 09/29/2020   Primary osteoarthritis of left hip 09/29/2020   Educated about COVID-19 virus infection 02/20/2020   Atrial fibrillation (HCC) 02/20/2020   Age-related osteoporosis without current pathological fracture 03/04/2019   Hypothyroidism 12/15/2016   Hip joint pain 09/16/2013   Lumbar pain with radiation down left leg 09/16/2013   Hypothyroidism (acquired) 06/18/2012   Past Medical History:  Diagnosis Date   Allergy    Arthritis    Cataract    both eyes   Dysrhythmia    afib   Hypothyroidism    Leaky heart valve     Osteoporosis    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   Thyroid disease    UTI (urinary tract infection)     Family History  Problem Relation Age of Onset   Congestive Heart Failure Father    Cancer Sister        breast   Breast cancer Sister    Colon polyps Sister    Cancer Maternal Grandmother    Breast cancer Maternal Aunt    Stomach cancer Maternal Aunt    Colon cancer Neg Hx    Esophageal cancer Neg Hx    Rectal cancer Neg Hx     Past Surgical History:  Procedure Laterality Date   CESAREAN SECTION     COLONOSCOPY     TOTAL HIP ARTHROPLASTY Right 12/14/2020   Procedure: RIGHT TOTAL HIP ARTHROPLASTY ANTERIOR APPROACH;  Surgeon: Tarry Kos, MD;  Location: [facility] OR;  Service: Orthopedics;  Laterality: Right;  3-C   TUBAL LIGATION     Social History   Occupational History   Not on file  Tobacco Use   Smoking status: Former    Types: Cigarettes    Quit date: 03/04/2005    Years since quitting: 16.0   Smokeless tobacco: Never  Vaping Use   Vaping Use: Never used  Substance and Sexual Activity   Alcohol use: No  Drug use: No   Sexual activity: Never    Birth control/protection: Surgical

## 2021-04-01 ENCOUNTER — Other Ambulatory Visit: Payer: Self-pay

## 2021-04-01 ENCOUNTER — Ambulatory Visit (INDEPENDENT_AMBULATORY_CARE_PROVIDER_SITE_OTHER): Payer: Medicare Other | Admitting: Family Medicine

## 2021-04-01 ENCOUNTER — Encounter: Payer: Self-pay | Admitting: Family Medicine

## 2021-04-01 VITALS — BP 128/80 | HR 68 | Temp 98.1°F | Ht 62.0 in | Wt 118.0 lb

## 2021-04-01 DIAGNOSIS — R002 Palpitations: Secondary | ICD-10-CM

## 2021-04-01 DIAGNOSIS — R233 Spontaneous ecchymoses: Secondary | ICD-10-CM | POA: Diagnosis not present

## 2021-04-01 DIAGNOSIS — E039 Hypothyroidism, unspecified: Secondary | ICD-10-CM | POA: Diagnosis not present

## 2021-04-01 LAB — TSH: TSH: 0.49 u[IU]/mL (ref 0.35–5.50)

## 2021-04-01 NOTE — Patient Instructions (Addendum)
Depending on TSH, may need to change meds again.  If thyroid test is normal and you continue to have fast heart rate symptoms, make sure you discuss that with your cardiologist.  Heart rate was normal in the office today.   I am glad to hear that your hand pain and swelling has improved.  I suspect it was a hematoma or collection of blood.  If those symptoms return, please follow-up and we can evaluate that further.

## 2021-04-01 NOTE — Progress Notes (Signed)
Subjective:  Patient ID: Kelly Mcdowell, female    DOB: February 21, 1941  Age: 80 y.o. MRN: [id number]  CC:  Chief Complaint  Patient presents with   Hypothyroidism    Follow-up-  Pt here for 3 month check on her Thyroid, pt stated--still having feeling fast heart beat.      HPI Kelly Mcdowell presents for   Hypothyroidism: Lab Results  Component Value Date   TSH 0.07 (L) 02/12/2021  Last discussed in August, was taking levothyroxine 88 mcg 4 days/week alternating with half tablet on other 3 days.  Low TSH at that time.  See phone notes, some increased palpitations  Taking 1/2 pill 4 d/week, full pill on other days.  Some cold intolerance with recent weather changes, no skin/hair or fatigue changes.  Still having palpitations not constant. Not daily . Resolve quickly. No chest pains. No syncope/near-syncope.  I did recommend that she change her medication to half tablet 4 days/week, with a full tablet on 3 days. Hx of atrial fibrillation, on xarelto. No rate control.  No new hot or cold intolerance. No new hair or skin changes, heart palpitations or new fatigue. No new weight changes.   R hand injury: Hit hand 1 month ago - went to urgent care, swollen area on top of hand. Seen by Urgent care - large blood blister. Xrays ok. Swelling improved as blood resolved.  No current pain. Min swelling now. No limitations.    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 09/16/2013   Hypothyroidism (acquired) 06/18/2012   Past Medical History:  Diagnosis  Date   Allergy    Arthritis    Cataract    both eyes   Dysrhythmia    afib   Hypothyroidism    Leaky heart valve    Osteoporosis    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 Authorizing Provider  aspirin EC 81 MG tablet Take 1 tablet (81 mg total) by mouth daily. Take once daily x 6 weeks after surgery IF still only taking one eliquis instead of 2. 12/13/20  Yes Cristie Hem, PA-C  cholecalciferol (VITAMIN D3) 25 MCG (1000 UNIT) tablet Take 2,000 Units by mouth daily.   Yes [provider]  famotidine (PEPCID) 20 MG tablet Take 20 mg by mouth daily as needed for heartburn or indigestion.   Yes [provider]  hydrOXYzine (ATARAX/VISTARIL) 25 MG tablet Take 1/2 tablet to one whole tablet by mouth daily as needed for itching 05/30/17  Yes [name], MD  levothyroxine (SYNTHROID) 88 MCG tablet Take 1  tablet (88 mcg total) by mouth daily before breakfast. 4 days per week - alternating with 1/2 tablet on other 3 days. 02/12/21  Yes [name], MD  Menthol, Topical Analgesic, (BIOFREEZE ROLL-ON) 4 % GEL Apply 1 application topically daily as needed (pain).   Yes [provider]  rivaroxaban (XARELTO) 20 MG TABS tablet Take 1 tablet (20 mg total) by mouth daily with supper. 02/09/21  Yes Rollene Rotunda, MD   Social History   Socioeconomic History   Marital status: Married    Spouse name: Not on file   Number of children: Not on file   Years of education: Not on file   Highest education level: Not on  file  Occupational History   Not on file  Tobacco Use   Smoking status: Former    Types: Cigarettes    Quit date: 03/04/2005    Years since quitting: 16.0   Smokeless tobacco: Never  Vaping Use   Vaping Use: Never used  Substance and Sexual Activity   Alcohol use: No   Drug use: No   Sexual activity: Never    Birth control/protection: Surgical  Other Topics Concern   Not on file  Social History Narrative   Married and lives with husband.  Three daughters.  12 grands.     Social Determinants of Health   Financial Resource Strain: Not on file  Food Insecurity: Not on file  Transportation Needs: Not on file  Physical Activity: Not on file  Stress: Not on file  Social Connections: Not on file  Intimate Partner Violence: Not on file    Review of Systems  Per HPI  Objective:   Vitals:   04/01/21 1340  BP: 128/80  Temp: 98.1 F (36.7 C)  SpO2: 97%  Weight: 118 lb (53.5 kg)  Height: 5\' 2"  (1.575 m)    Physical Exam Vitals reviewed.  Constitutional:      Appearance: Normal appearance. She is well-developed.  HENT:     Head: Normocephalic and atraumatic.  Eyes:     Conjunctiva/sclera: Conjunctivae normal.     Pupils: Pupils are equal, round, and reactive to light.  Neck:     Vascular: No carotid bruit.     Comments: No appreciable nodule/thyromegaly. Cardiovascular:     Rate and Rhythm: Normal rate and regular rhythm.     Heart sounds: Normal heart sounds.     Comments: Regular rhythm, regular rate, no apparent ectopy on my exam. Pulmonary:     Effort: Pulmonary effort is normal.     Breath sounds: Normal breath sounds.  Abdominal:     Palpations: Abdomen is soft. There is no pulsatile mass.     Tenderness: There is no abdominal tenderness.  Musculoskeletal:     Right lower leg: No edema.     Left lower leg: No edema.     Comments: Right hand nontender, no bony tenderness, full range of motion, skin intact, no ecchymosis or swelling/mass  Skin:     General: Skin is warm and dry.  Neurological:     Mental Status: She is alert and oriented to person, place, and time.  Psychiatric:        Mood and Affect: Mood normal.        Behavior: Behavior normal.       Assessment & Plan:  Kelly Mcdowell is a 80 y.o. female . Hypothyroidism, unspecified type - Plan: TSH Palpitation  -history of A. fib with intermittent palpitations, prior TSH was low,  may need further adjustments in her Synthroid.  Check TSH today, and if normal will need cardiology follow-up, potentially may need beta-blocker or rate control if persistent palpitations.  Spontaneous hematoma of hand  -Now resolved, reassuring exam, RTC precautions given.  No orders of the defined types were placed in this encounter.  Patient Instructions  Depending on TSH, may need to change meds again.  If thyroid test is normal and you continue to have fast heart rate symptoms, make sure you discuss that with your cardiologist.  Heart rate was normal in the office today.   I am glad to hear that your hand pain and swelling has improved.  I suspect it was a hematoma or collection of blood.  If those symptoms return, please follow-up and we can evaluate that further.         Signed,   Meredith Staggers, MD [facility] 04/01/21 2:12 PM

## 2021-05-05 DIAGNOSIS — H40033 Anatomical narrow angle, bilateral: Secondary | ICD-10-CM | POA: Diagnosis not present

## 2021-05-05 DIAGNOSIS — H2513 Age-related nuclear cataract, bilateral: Secondary | ICD-10-CM | POA: Diagnosis not present

## 2021-05-05 DIAGNOSIS — H35373 Puckering of macula, bilateral: Secondary | ICD-10-CM | POA: Diagnosis not present

## 2021-05-10 ENCOUNTER — Other Ambulatory Visit: Payer: Self-pay | Admitting: Cardiology

## 2021-05-10 DIAGNOSIS — I4891 Unspecified atrial fibrillation: Secondary | ICD-10-CM

## 2021-05-11 NOTE — Telephone Encounter (Signed)
Prescription refill request for Xarelto received.   Indication: afib  Last office visit: [place], 11/04/2020 Weight: 53.5 kg  Age: 80 yo  Scr: 0.71, 12/15/2020 CrCl: 55 ml/min   Refill sent.

## 2021-06-02 ENCOUNTER — Telehealth: Payer: Self-pay | Admitting: *Deleted

## 2021-06-02 NOTE — Telephone Encounter (Signed)
Ortho bundle 90 day call completed. 

## 2021-06-24 DIAGNOSIS — H2511 Age-related nuclear cataract, right eye: Secondary | ICD-10-CM | POA: Diagnosis not present

## 2021-07-01 ENCOUNTER — Telehealth: Payer: Self-pay | Admitting: Family Medicine

## 2021-07-01 ENCOUNTER — Ambulatory Visit: Payer: Medicare Other | Admitting: Family Medicine

## 2021-07-01 ENCOUNTER — Telehealth (INDEPENDENT_AMBULATORY_CARE_PROVIDER_SITE_OTHER): Payer: Medicare Other | Admitting: Family Medicine

## 2021-07-01 VITALS — Temp 98.4°F

## 2021-07-01 DIAGNOSIS — R051 Acute cough: Secondary | ICD-10-CM | POA: Diagnosis not present

## 2021-07-01 DIAGNOSIS — U071 COVID-19: Secondary | ICD-10-CM | POA: Diagnosis not present

## 2021-07-01 DIAGNOSIS — R509 Fever, unspecified: Secondary | ICD-10-CM

## 2021-07-01 MED ORDER — BENZONATATE 100 MG PO CAPS: 100.0000 mg | ORAL_CAPSULE | Freq: Three times a day (TID) | ORAL | 0 refills | Status: DC | PRN

## 2021-07-01 MED ORDER — MOLNUPIRAVIR EUA 200MG CAPSULE: 4.0000 | ORAL_CAPSULE | Freq: Two times a day (BID) | ORAL | 0 refills | Status: AC

## 2021-07-01 NOTE — Telephone Encounter (Signed)
Noted - see addendum on note.

## 2021-07-01 NOTE — Addendum Note (Signed)
Addended by: Meredith Staggers R on: 07/01/2021 06:01 PM   Modules accepted: Orders

## 2021-07-01 NOTE — Telephone Encounter (Signed)
Patient had a virtual with you today and just wanted to let you know she had covid.

## 2021-07-01 NOTE — Progress Notes (Addendum)
Virtual Visit via audio Note  I connected with Kelly Mcdowell on 07/01/21 at 11:14 AM by phone after multiple failed attempts at video enabled telemedicine application and verified that I am speaking with the correct person using two identifiers.  Patient location: home - with husband, consent obtained to discuss PHI.  My location: office - [place].    I discussed the limitations, risks, security and privacy concerns of performing an evaluation and management service by telephone and the availability of in person appointments. I also discussed with the patient that there may be a patient responsible charge related to this service. The patient expressed understanding and agreed to proceed, consent obtained  Chief complaint:  Chief Complaint  Patient presents with   Cough    Pt reports sxs starting Monday, cough, congestion, fever of and on since starting, reports has had chills, sore throat from coughing, reports dry cough, no body aches, feels weak, denies nausea vomiting changes in bm      History of Present Illness: Kelly Mcdowell is a 80 y.o. female  Cough: Symptoms started 3 days ago on 06/28/2021. Some chills on 12/25 evening. Cough, congestion, fever off and on with some chills, sore throat.  Dry cough.  Some general fatigue.  No nausea/vomiting or change in bowel movements. Husband started with fever/chills night prior. Same symptoms, but he is improving.  Temp up to 101, 98 now. Taking tylenol.  Eating and drinking fluids ok, no CP/dyspnea. No confusion. No insomnia d/t cough. Min dry cough.  No home covid tests. No prior covid infection.  Feels weak but better overall compared to few days ago, less chills.   COVID-19 risk of complication score of 2. COVID vaccination: None - has declined vaccination. Flu vaccine: not this year.   She is on anticoagulation with Xarelto.   Patient Active Problem List   Diagnosis Date Noted   Status post total replacement of right hip  12/14/2020   Primary osteoarthritis of right hip 09/29/2020   Primary osteoarthritis of left hip 09/29/2020   Educated about COVID-19 virus infection 02/20/2020   Atrial fibrillation (HCC) 02/20/2020   Age-related osteoporosis without current pathological fracture 03/04/2019   Hypothyroidism 12/15/2016   Hip joint pain 09/16/2013   Lumbar pain with radiation down left leg 09/16/2013   Hypothyroidism (acquired) 06/18/2012   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 Authorizing Provider  aspirin EC 81 MG tablet Take 1 tablet (81 mg total) by mouth daily. Take once daily x 6 weeks after surgery IF still only taking one eliquis instead of 2. 12/13/20  Yes Cristie Hem, PA-C  cholecalciferol (VITAMIN D3) 25 MCG (1000 UNIT) tablet Take 2,000 Units by mouth daily.   Yes [provider]  famotidine (PEPCID) 20 MG tablet Take 20 mg by mouth daily as needed for heartburn or indigestion.   Yes [provider]  hydrOXYzine (ATARAX/VISTARIL) 25 MG tablet Take 1/2 tablet to one whole tablet by mouth daily as needed for itching 05/30/17  Yes Wendie Agreste, MD  levothyroxine (SYNTHROID) 88 MCG tablet Take 1 tablet (88 mcg total) by mouth daily before breakfast. 4 days per week - alternating with 1/2 tablet on other 3 days. 02/12/21  Yes Wendie Agreste, MD   Menthol, Topical Analgesic, (BIOFREEZE ROLL-ON) 4 % GEL Apply 1 application topically daily as needed (pain).   Yes [provider]  rivaroxaban (XARELTO) 20 MG TABS tablet TAKE 1 TABLET BY MOUTH ONCE DAILY WITH SUPPER 05/11/21  Yes Minus Breeding, MD   Social History   Socioeconomic History   Marital status: Married    Spouse name: Not on file   Number of children: Not on file   Years of education: Not on file   Highest education level: Not on file  Occupational History   Not on file  Tobacco Use   Smoking status: Former    Types: Cigarettes    Quit date: 03/04/2005    Years since quitting: 16.3   Smokeless tobacco: Never  Vaping Use   Vaping Use: Never used  Substance and Sexual Activity   Alcohol use: No   Drug use: No   Sexual activity: Never    Birth control/protection: Surgical  Other Topics Concern   Not on file  Social History Narrative   Married and lives with husband.  Three daughters.  12 grands.     Social Determinants of Health   Financial Resource Strain: Not on file  Food Insecurity: Not on file  Transportation Needs: Not on file  Physical Activity: Not on file  Stress: Not on file  Social Connections: Not on file  Intimate Partner Violence: Not on file    Observations/Objective: Vitals:   07/01/21 1053  Temp: 98.4 F (36.9 C)  TempSrc: Oral  Speaking in full sentences on the phone, no respiratory distress, no cough during phone call.  Euthymic mood.  Appropriate responses, coherent responses.  All questions were answered with understanding plan expressed.   Assessment and Plan: Acute cough - Plan: benzonatate (TESSALON) 100 MG capsule  Fever, unspecified fever cause - Plan: benzonatate (TESSALON) 100 MG capsule Suspicious for COVID versus influenza infection.  Has not received immunization for either.  Based on timing of symptoms, likely outside benefit of treatment with Tamiflu if influenza.  Symptoms are improving.  Recommended COVID  testing today and if positive would consider molnupiravir as unvaccinated and age, risk of complications.  Potential side effects, risks, risk of rebound COVID were discussed.  We will discuss results of COVID testing with her later.  Follow Up Instructions: Depending on results above, ER precautions discussed.   I discussed the assessment and treatment plan with the patient. The patient was provided an opportunity to ask questions and all were answered. The patient agreed with the plan and demonstrated an understanding of the instructions.   The patient was advised to call back or seek an in-person evaluation if the symptoms worsen or if the condition fails to improve as anticipated.  I provided 14 minutes of non-face-to-face time during this encounter.   Wendie Agreste, MD   5:53 PM Addendum Home test positive for covid.  Will start molnupiravir, with potential side effects discussed.  Urgent care, ER precautions  given.  Isolation/, masking per CDC guidelines discussed.

## 2021-07-01 NOTE — Telephone Encounter (Signed)
Pt called in stating that she tested positive for covid, she wanted to make Dr. Neva Seat aware of this. Please advise

## 2021-07-19 DIAGNOSIS — H2512 Age-related nuclear cataract, left eye: Secondary | ICD-10-CM | POA: Diagnosis not present

## 2021-07-19 IMAGING — RF DG HIP (WITH PELVIS) OPERATIVE*R*
1 series · 3 of 3 positions shown · non-contrast
Comparison: Hip radiographs 09/22/2020.

CLINICAL DATA: Surgery, elective. Additional history provided:
Right hip arthroplasty with anterior approach. Provided fluoroscopy
time 18 seconds (1.73 mGy).

EXAM:
OPERATIVE right HIP (WITH PELVIS IF PERFORMED) 3 VIEWS
TECHNIQUE: Fluoroscopic spot image(s) were submitted for interpretation
post-operatively.

[Series 1: unknown protocol · 0.20mm/px · 3 of 3 slices shown]
[im 1/3]
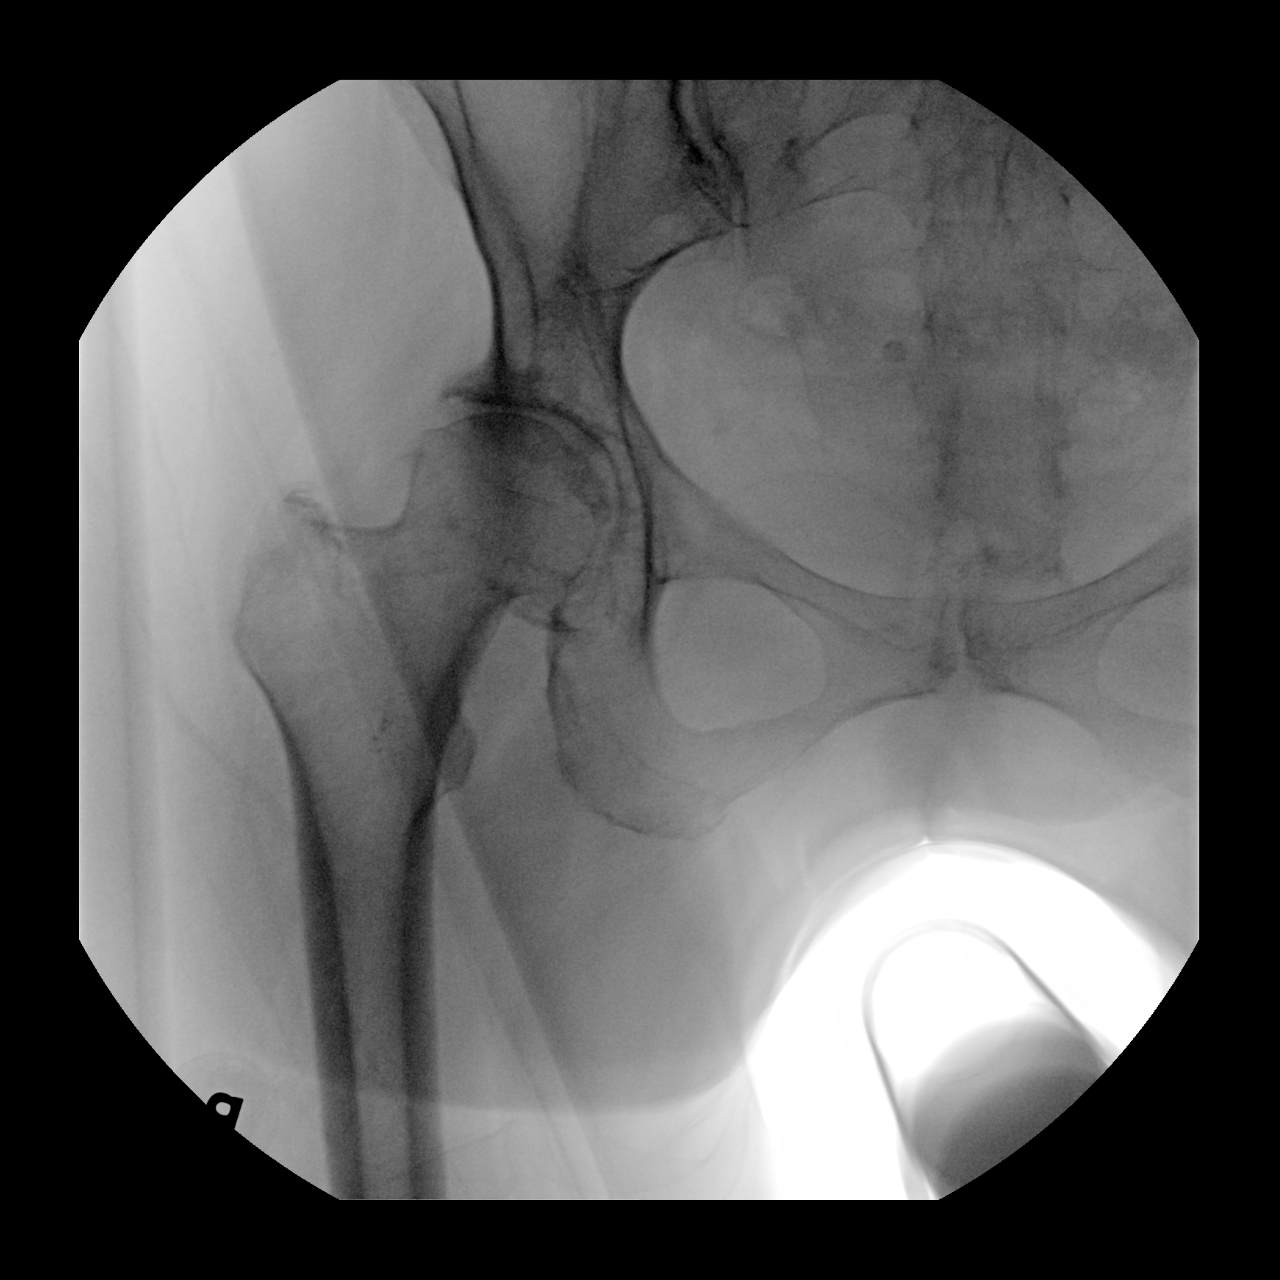
[im 2/3]
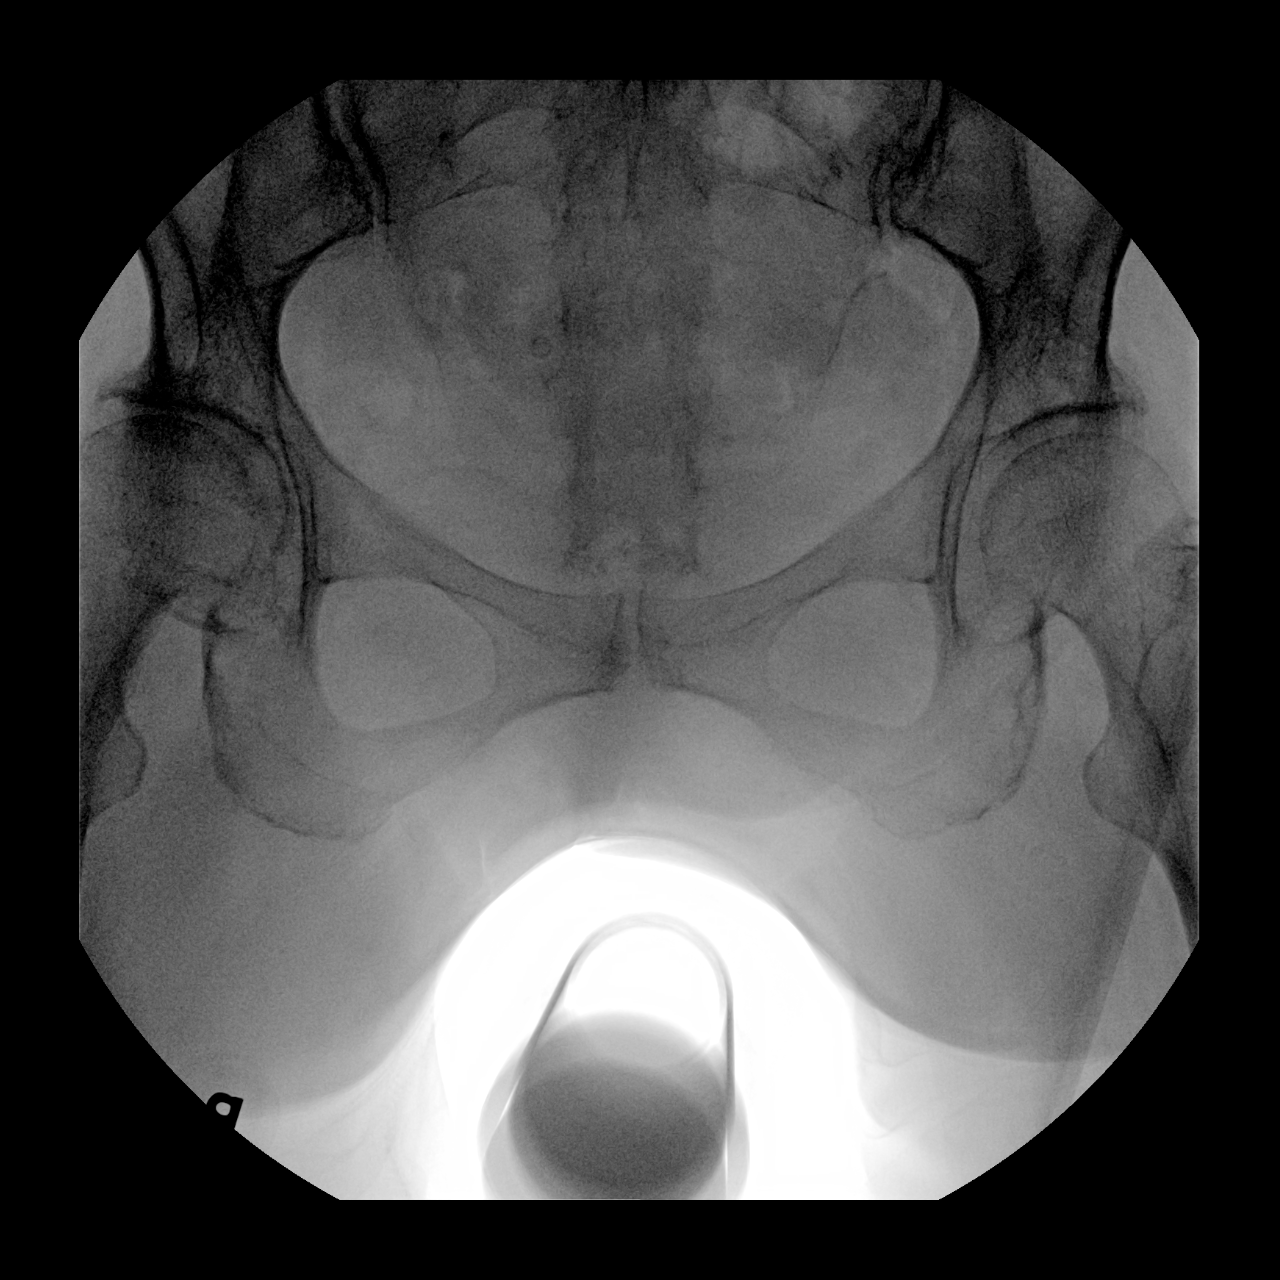
[im 3/3]
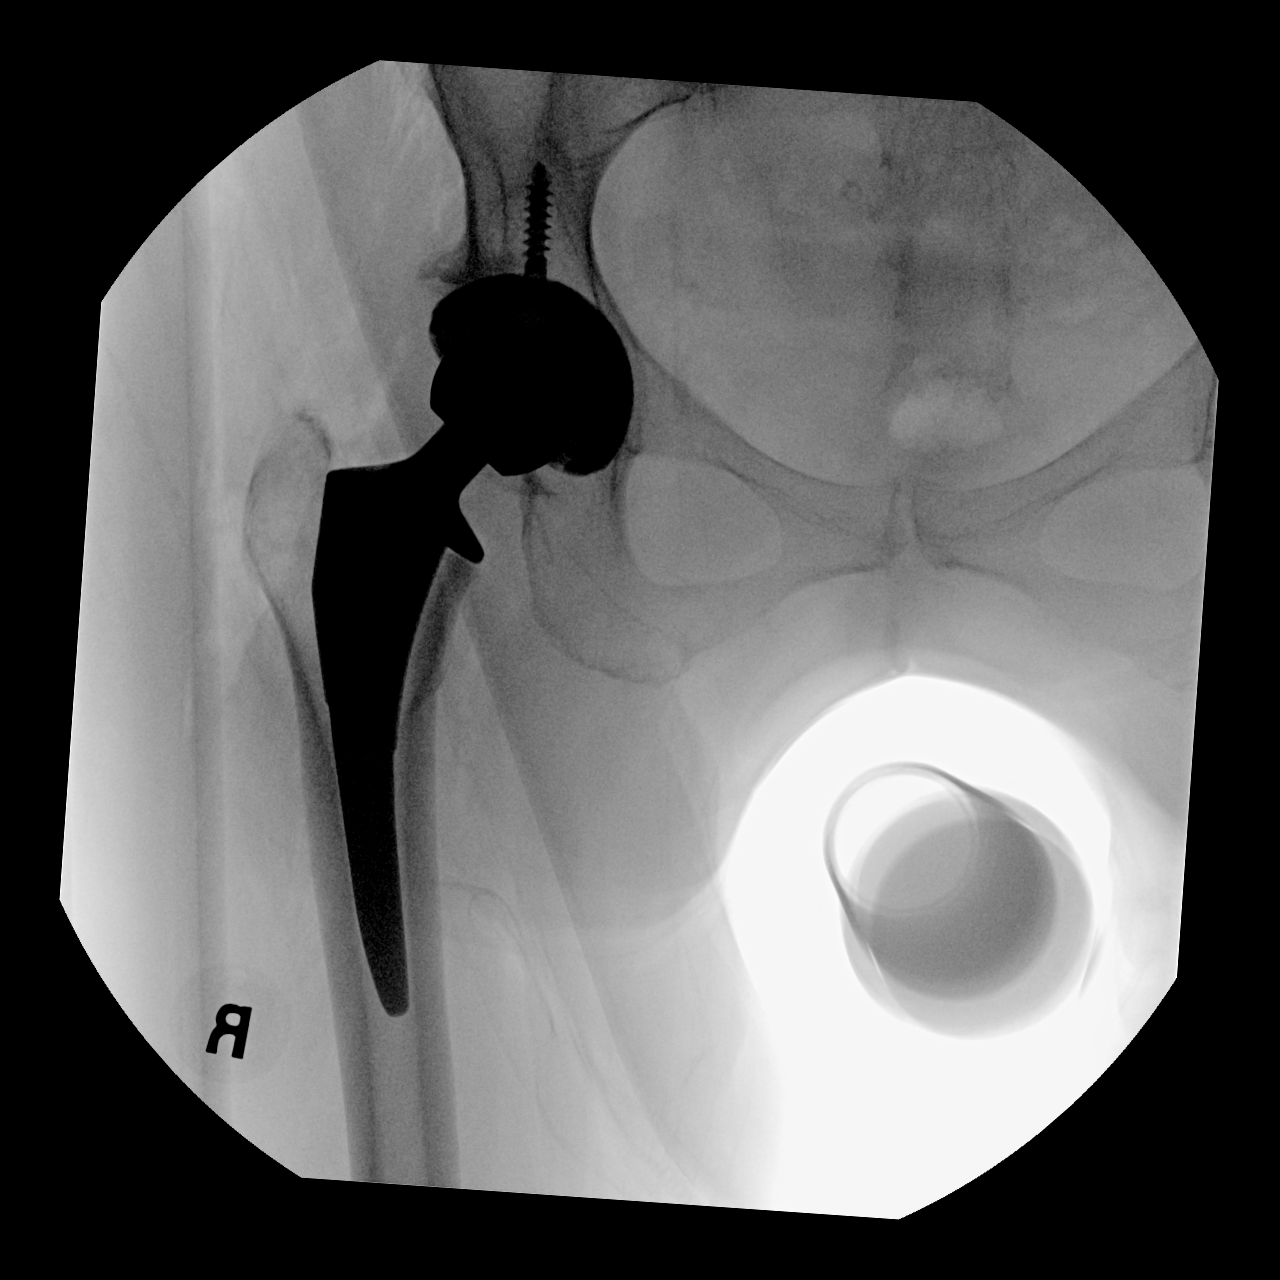

[3 of 3 positions shown; findings below may reference images not displayed]

FINDINGS: Three intraoperative fluoroscopic images of the right hip are
submitted. On the provided images, there are findings of interval
right total hip arthroplasty. The femoral and acetabular components
appear well seated. No unexpected finding on the provided views.
IMPRESSION: Three intraoperative fluoroscopic images of the right hip from right
total hip arthroplasty, as described.

## 2021-07-22 DIAGNOSIS — H2512 Age-related nuclear cataract, left eye: Secondary | ICD-10-CM | POA: Diagnosis not present

## 2021-08-16 ENCOUNTER — Ambulatory Visit (INDEPENDENT_AMBULATORY_CARE_PROVIDER_SITE_OTHER): Payer: Medicare Other | Admitting: Family Medicine

## 2021-08-16 ENCOUNTER — Encounter: Payer: Self-pay | Admitting: Family Medicine

## 2021-08-16 VITALS — BP 130/76 | HR 67 | Temp 98.1°F | Resp 15 | Ht 62.0 in | Wt 117.8 lb

## 2021-08-16 DIAGNOSIS — E039 Hypothyroidism, unspecified: Secondary | ICD-10-CM

## 2021-08-16 DIAGNOSIS — R0982 Postnasal drip: Secondary | ICD-10-CM

## 2021-08-16 DIAGNOSIS — Z8616 Personal history of COVID-19: Secondary | ICD-10-CM | POA: Diagnosis not present

## 2021-08-16 DIAGNOSIS — R040 Epistaxis: Secondary | ICD-10-CM

## 2021-08-16 MED ORDER — FLUTICASONE PROPIONATE 50 MCG/ACT NA SUSP: 1.0000 | Freq: Every day | NASAL | 6 refills | Status: DC

## 2021-08-16 MED ORDER — LEVOTHYROXINE SODIUM 88 MCG PO TABS: 88.0000 ug | ORAL_TABLET | Freq: Every day | ORAL | 2 refills | Status: DC

## 2021-08-16 NOTE — Patient Instructions (Signed)
Try flonase nasal spray once in each nostril daily.  If any return of blood in nasal discharge, let me know andI can refer you to ENT specialist. Flonase should help drainage as well. Keep me posted.

## 2021-08-16 NOTE — Progress Notes (Signed)
Subjective:  Patient ID: Kelly Mcdowell, female    DOB: 01-14-1941  Age: 81 y.o. MRN: [id number]  CC:  Chief Complaint  Patient presents with   Hypothyroidism    Pt here for recheck no concerns, notes someone else had changed her thyroid medication and has been taking that for several months, cannot recall the name of the medication     HPI Kelly Mcdowell presents for   Hypothyroidism: Lab Results  Component Value Date   TSH 0.49 04/01/2021  Taking medication daily. Synthroid 61mcg QD for 3 days per week, 70mcg QD on other 4 days per week.  No new hot or cold intolerance. No new hair or skin changes, heart palpitations or new fatigue. No new weight changes.   PND, rhiniorrhea, blood in nasal d/c.  COVID infection in December. Cleared soon after our visit. Did not take molnupiravir - pharmacy did not have and was getting better. Cough cleared with tessalon.  Some tickle in throat/pnd for years - some clear runny nose at times.  Rare blood clot in mucus. Less than once per month. Sometimes blood in nasal discharge with blowing nose since starting xarelto. Remote hx of tobacco use - smoker, quit in 2006. Marland Kitchen  No current nasal spray.    History of  atrial fibrillation  Xarelto without rate control. No new bleeding.    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/14/2020   Primary osteoarthritis of right hip 09/29/2020   Primary osteoarthritis of left hip 09/29/2020   Educated about COVID-19 virus infection 02/20/2020   Atrial fibrillation ([place]) 02/20/2020   Age-related osteoporosis without current pathological fracture 03/04/2019   Hypothyroidism 12/15/2016   Hip joint pain 09/16/2013   Lumbar pain with radiation down left leg 09/16/2013   Hypothyroidism (acquired) 06/18/2012   Past Medical History:  Diagnosis Date   Allergy    Arthritis    Cataract    both eyes   Dysrhythmia    afib   Hypothyroidism     Leaky heart valve    Osteoporosis    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 Authorizing Provider  aspirin EC 81 MG tablet Take 1 tablet (81 mg total) by mouth daily. Take once daily x 6 weeks after surgery IF still only taking one eliquis instead of 2. 12/13/20  Yes Aundra Dubin, PA-C  cholecalciferol (VITAMIN D3) 25 MCG (1000 UNIT) tablet Take 2,000 Units by mouth daily.   Yes [provider]  famotidine (PEPCID) 20 MG tablet Take 20 mg by mouth daily as needed for heartburn or indigestion.   Yes [provider]  hydrOXYzine (ATARAX/VISTARIL) 25 MG tablet Take 1/2 tablet to one whole tablet by mouth daily as needed for itching 05/30/17  Yes Wendie Agreste, MD  levothyroxine (SYNTHROID) 88 MCG tablet Take 1 tablet (88 mcg total) by mouth daily before breakfast. 4 days per week - alternating with  1/2 tablet on other 3 days. 02/12/21  Yes Wendie Agreste, MD  Menthol, Topical Analgesic, (BIOFREEZE ROLL-ON) 4 % GEL Apply 1 application topically daily as needed (pain).   Yes [provider]  rivaroxaban (XARELTO) 20 MG TABS tablet TAKE 1 TABLET BY MOUTH ONCE DAILY WITH SUPPER 05/11/21  Yes Minus Breeding, MD   Social History   Socioeconomic History   Marital status: Married    Spouse name: Not on file   Number of children: Not on file   Years of education: Not on file   Highest education level: Not on file  Occupational History   Not on file  Tobacco Use   Smoking status: Former    Types: Cigarettes     Quit date: 03/04/2005    Years since quitting: 16.4   Smokeless tobacco: Never  Vaping Use   Vaping Use: Never used  Substance and Sexual Activity   Alcohol use: No   Drug use: No   Sexual activity: Never    Birth control/protection: Surgical  Other Topics Concern   Not on file  Social History Narrative   Married and lives with husband.  Three daughters.  12 grands.     Social Determinants of Health   Financial Resource Strain: Not on file  Food Insecurity: Not on file  Transportation Needs: Not on file  Physical Activity: Not on file  Stress: Not on file  Social Connections: Not on file  Intimate Partner Violence: Not on file    Review of Systems Per HPI.   Objective:   Vitals:   08/16/21 1430  BP: 130/76  Pulse: 67  Resp: 15  Temp: 98.1 F (36.7 C)  TempSrc: Temporal  SpO2: 98%  Weight: 117 lb 12.8 oz (53.4 kg)  Height: 5\' 2"  (1.575 m)     Physical Exam Vitals reviewed.  Constitutional:      Appearance: Normal appearance. She is well-developed.  HENT:     Head: Normocephalic and atraumatic.     Nose: Nose normal. No congestion or rhinorrhea.     Comments: No wounds or dried blood/active bleeding identified.  Posterior oropharynx without any blood or drainage. Eyes:     Conjunctiva/sclera: Conjunctivae normal.     Pupils: Pupils are equal, round, and reactive to light.  Neck:     Vascular: No carotid bruit.  Cardiovascular:     Rate and Rhythm: Normal rate and regular rhythm.     Heart sounds: Normal heart sounds.  Pulmonary:     Effort: Pulmonary effort is normal.     Breath sounds: Normal breath sounds.  Abdominal:     Palpations: Abdomen is soft. There is no pulsatile mass.     Tenderness: There is no abdominal tenderness.  Musculoskeletal:     Right lower leg: No edema.     Left lower leg: No edema.  Skin:    General: Skin is warm and dry.  Neurological:     Mental Status: She is alert and oriented to person, place, and time.   Psychiatric:        Mood and Affect: Mood normal.        Behavior: Behavior normal.    Assessment & Plan:  Kelly Mcdowell is a 81 y.o. female . History of COVID-19  -Recovered, minimal symptoms as above.  Hypothyroidism, unspecified type  -Denies new symptoms, check TSH, continue same dose Synthroid for now with alternating doses 44 mcg 4 days/week, 88 mcg 3 days/week.  PND (post-nasal drip)  Nasal bleeding]  -Possible allergic versus vasomotor component to postnasal drainage.  We will initially try Flonase nasal spray, option of Atrovent nasal spray if that is not helpful.  If any return of blood in mucus recommended ENT evaluation but has noticed that since starting the blood thinner.  RTC precautions.  No orders of the defined types were placed in this encounter.  Patient Instructions  Try flonase nasal spray once in each nostril daily.  If any return of blood in nasal discharge, let me know andI can refer you to ENT specialist. Flonase should help drainage as well. Keep me posted.       Signed,   Merri Ray, MD [facility] 08/16/21 3:35 PM

## 2021-08-17 LAB — TSH: TSH: 0.86 u[IU]/mL (ref 0.35–5.50)

## 2021-09-07 ENCOUNTER — Other Ambulatory Visit: Payer: Self-pay

## 2021-09-07 ENCOUNTER — Ambulatory Visit (INDEPENDENT_AMBULATORY_CARE_PROVIDER_SITE_OTHER): Payer: Medicare Other | Admitting: Orthopaedic Surgery

## 2021-09-07 ENCOUNTER — Encounter: Payer: Self-pay | Admitting: Orthopaedic Surgery

## 2021-09-07 ENCOUNTER — Ambulatory Visit (INDEPENDENT_AMBULATORY_CARE_PROVIDER_SITE_OTHER): Payer: Medicare Other

## 2021-09-07 DIAGNOSIS — Z96641 Presence of right artificial hip joint: Secondary | ICD-10-CM | POA: Diagnosis not present

## 2021-09-07 NOTE — Progress Notes (Signed)
? ?Post-Op Visit Note ?  ?Patient: Kelly Mcdowell           ?Date of Birth: 1940-09-15           ?MRN: [id number] ?Visit Date: 09/07/2021 ?PCP: [name], MD ? ? ?Assessment & Plan: ? ?Chief Complaint: No chief complaint on file. ? ?Visit Diagnoses:  ?1. History of total right hip replacement   ? ? ?Plan: Patient is a pleasant 81 year old female who comes in today 16-month status post right total hip replacement 12/14/2020.  She has been doing well.  She notes some achiness and stiffness when she goes from a seated to standing position as well as when she is standing for a long time, but overall feels like she has improved quite a bit.  Examination of the right hip reveals painless logroll.  Full hip flexion.  She is neurovascular intact distally.  At this point, she will continue to advance with activity as tolerated.  Dental prophylaxis reinforced.  Follow-up with [place] in 3 months time for repeat evaluation and AP pelvis x-rays.  Call with concerns or questions. ? ?Follow-Up Instructions: Return in about 3 months (around 12/08/2021).  ? ?Orders:  ?Orders Placed This Encounter  ?Procedures  ? XR Pelvis 1-2 Views  ? ?No orders of the defined types were placed in this encounter. ? ? ?Imaging: ?XR Pelvis 1-2 Views ? ?Result Date: 09/07/2021 ?X-rays demonstrate a well-seated prosthesis without complication  ? ?PMFS History: ?Patient Active Problem List  ? Diagnosis Date Noted  ? Status post total replacement of right hip 12/14/2020  ? Primary osteoarthritis of right hip 09/29/2020  ? Primary osteoarthritis of left hip 09/29/2020  ? Educated about COVID-19 virus infection 02/20/2020  ? Atrial fibrillation (HCC) 02/20/2020  ? Age-related osteoporosis without current pathological fracture 03/04/2019  ? Hypothyroidism 12/15/2016  ? Hip joint pain 09/16/2013  ? Lumbar pain with radiation down left leg 09/16/2013  ? Hypothyroidism (acquired) 06/18/2012  ? ?Past Medical History:  ?Diagnosis Date  ? Allergy   ? Arthritis   ?  Cataract   ? both eyes  ? Dysrhythmia   ? afib  ? Hypothyroidism   ? Leaky heart valve   ? Osteoporosis   ? 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 Thyroid disease   ? UTI (urinary tract infection)   ?  ?Family History  ?Problem Relation Age of Onset  ? Congestive Heart Failure Father   ? Cancer Sister   ?     breast  ? Breast cancer Sister   ? Colon polyps Sister   ? Cancer Maternal Grandmother   ? Breast cancer Maternal Aunt   ? Stomach cancer Maternal Aunt   ? Colon cancer Neg Hx   ? Esophageal cancer Neg Hx   ? Rectal cancer Neg Hx   ?  ?Past Surgical History:  ?Procedure Laterality Date  ? CESAREAN SECTION    ? COLONOSCOPY    ? TOTAL HIP ARTHROPLASTY Right 12/14/2020  ? Procedure: RIGHT TOTAL HIP ARTHROPLASTY ANTERIOR APPROACH;  Surgeon: Tarry Kos, MD;  Location: [facility] OR;  Service: Orthopedics;  Laterality: Right;  3-C  ? TUBAL LIGATION    ? ?Social History  ? ?Occupational History  ? Not on file  ?Tobacco Use  ? Smoking status: Former  ?  Types: Cigarettes  ?  Quit date: 03/04/2005  ?  Years since quitting: 16.5  ? Smokeless tobacco: Never  ?Vaping Use  ? Vaping Use: Never used  ?Substance and  Sexual Activity  ? Alcohol use: No  ? Drug use: No  ? Sexual activity: Never  ?  Birth control/protection: Surgical  ? ? ? ?

## 2021-10-12 ENCOUNTER — Ambulatory Visit (INDEPENDENT_AMBULATORY_CARE_PROVIDER_SITE_OTHER): Payer: Medicare Other | Admitting: Registered Nurse

## 2021-10-12 ENCOUNTER — Encounter: Payer: Self-pay | Admitting: Registered Nurse

## 2021-10-12 VITALS — BP 142/78 | HR 60 | Temp 98.3°F | Resp 18 | Ht 62.0 in | Wt 119.2 lb

## 2021-10-12 DIAGNOSIS — R319 Hematuria, unspecified: Secondary | ICD-10-CM

## 2021-10-12 LAB — POCT URINALYSIS DIP (MANUAL ENTRY)
Bilirubin, UA: NEGATIVE
Glucose, UA: NEGATIVE mg/dL
Ketones, POC UA: NEGATIVE mg/dL
Nitrite, UA: NEGATIVE
Protein Ur, POC: 100 mg/dL — AB
Spec Grav, UA: 1.015 (ref 1.010–1.025)
Urobilinogen, UA: 0.2 E.U./dL
pH, UA: 7.5 (ref 5.0–8.0)

## 2021-10-12 NOTE — Progress Notes (Signed)
? ?Acute Office Visit ? ?Subjective:  ? ? Patient ID: Kelly Mcdowell, female    DOB: May 07, 1941, 81 y.o.   MRN: [id number] ? ?Chief Complaint  ?Patient presents with  ? Hematuria  ?  Patient states she went to the bathroom urinated and made a bowel movement and saw a lot of blood but was unsure of where it came from. Patient states she went back to only urinate and saw more blood , but she can tell she has a hemorrhoid with no bleeding .   ? ? ?HPI ?Patient is in today for hematuria.  ? ?Gross hematuria starting this morning. Was unclear whether this came from bladder or bowel. ?Noted on next voiding that there was blood in urine. ? ?No dysuria, suprapubic pain, abdominal pain, flank pain. ?Question of frequency and urgency - some at baseline ? ?No recent weight changes, nvd, headaches, visual changes, lower extremity or saddle symptoms.  ? ?Outpatient Medications Prior to Visit  ?Medication Sig Dispense Refill  ? cholecalciferol (VITAMIN D3) 25 MCG (1000 UNIT) tablet Take 2,000 Units by mouth daily.    ? famotidine (PEPCID) 20 MG tablet Take 20 mg by mouth daily as needed for heartburn or indigestion.    ? fluticasone (FLONASE) 50 MCG/ACT nasal spray Place 1 spray into both nostrils daily. 16 g 6  ? hydrOXYzine (ATARAX/VISTARIL) 25 MG tablet Take 1/2 tablet to one whole tablet by mouth daily as needed for itching 30 tablet 1  ? levothyroxine (SYNTHROID) 88 MCG tablet Take 1 tablet (88 mcg total) by mouth daily before breakfast. 3days per week - alternating with 1/2 tablet on other 4 days. 90 tablet 2  ? rivaroxaban (XARELTO) 20 MG TABS tablet TAKE 1 TABLET BY MOUTH ONCE DAILY WITH SUPPER 30 tablet 5  ? aspirin EC 81 MG tablet Take 1 tablet (81 mg total) by mouth daily. Take once daily x 6 weeks after surgery IF still only taking one eliquis instead of 2. (Patient not taking: Reported on 10/12/2021) 42 tablet 0  ? Menthol, Topical Analgesic, (BIOFREEZE ROLL-ON) 4 % GEL Apply 1 application topically daily as needed  (pain). (Patient not taking: Reported on 10/12/2021)    ? ?No facility-administered medications prior to visit.  ? ? ?Review of Systems  ?Constitutional: Negative.   ?HENT: Negative.    ?Eyes: Negative.   ?Respiratory: Negative.    ?Cardiovascular: Negative.   ?Gastrointestinal: Negative.   ?Genitourinary:  Positive for hematuria. Negative for decreased urine volume, difficulty urinating, dyspareunia, dysuria, enuresis, flank pain, frequency, genital sores, menstrual problem, pelvic pain, urgency, vaginal bleeding, vaginal discharge and vaginal pain.  ?Musculoskeletal: Negative.   ?Skin: Negative.   ?Neurological: Negative.   ?Psychiatric/Behavioral: Negative.    ?All other systems reviewed and are negative. ? ?   ?Objective:  ?  ?BP (!) 142/78   Pulse 60   Temp 98.3 ?F (36.8 ?C) (Temporal)   Resp 18   Ht 5\' 2"  (1.575 m)   Wt 119 lb 3.2 oz (54.1 kg)   BMI 21.80 kg/m?  ?Physical Exam ?Vitals and nursing note reviewed.  ?Constitutional:   ?   General: She is not in acute distress. ?   Appearance: Normal appearance. She is normal weight. She is not ill-appearing, toxic-appearing or diaphoretic.  ?Cardiovascular:  ?   Rate and Rhythm: Normal rate and regular rhythm.  ?   Heart sounds: Normal heart sounds. No murmur heard. ?  No friction rub. No gallop.  ?Pulmonary:  ?   Effort:  Pulmonary effort is normal. No respiratory distress.  ?   Breath sounds: Normal breath sounds. No stridor. No wheezing, rhonchi or rales.  ?Chest:  ?   Chest wall: No tenderness.  ?Skin: ?   General: Skin is warm and dry.  ?Neurological:  ?   General: No focal deficit present.  ?   Mental Status: She is alert and oriented to person, place, and time. Mental status is at baseline.  ?Psychiatric:     ?   Mood and Affect: Mood normal.     ?   Behavior: Behavior normal.     ?   Thought Content: Thought content normal.     ?   Judgment: Judgment normal.  ? ? ?Results for orders placed or performed in visit on 10/12/21  ?POCT urinalysis dipstick   ?Result Value Ref Range  ? Color, UA red (A) yellow  ? Clarity, UA clear clear  ? Glucose, UA negative negative mg/dL  ? Bilirubin, UA negative negative  ? Ketones, POC UA negative negative mg/dL  ? Spec Grav, UA 1.015 1.010 - 1.025  ? Blood, UA large (A) negative  ? pH, UA 7.5 5.0 - 8.0  ? Protein Ur, POC =100 (A) negative mg/dL  ? Urobilinogen, UA 0.2 0.2 or 1.0 E.U./dL  ? Nitrite, UA Negative Negative  ? Leukocytes, UA Small (1+) (A) Negative  ? ? ? ?   ?Assessment & Plan:  ?1. Hematuria, unspecified type ?- Urine Culture ?- POCT urinalysis dipstick ?- [place], RETROPERITNL ABD,  LTD ? ? ? ?No orders of the defined types were placed in this encounter. ? ? ?Return if symptoms worsen or fail to improve. ? ?PLAN ?Bactrim po bid x 3 days for suspected UTI ?Order ultrasound kidneys and bladder to rule out malignancy ?Follow up as indicated ?Patient encouraged to call clinic with any questions, comments, or concerns. ? ?Janeece Agee, NP ?

## 2021-10-12 NOTE — Patient Instructions (Addendum)
Kelly Mcdowell -  ? ?Great to meet you ? ?Take bactrim twice daily for three days. ? ?Let's get ultrasound imaging to rule out anything more sinister. ? ?We will get in touch later this week to check on you ? ?Thanks, ? ?Rich  ? ? ? ?If you have lab work done today you will be contacted with your lab results within the next 2 weeks.  If you have not heard from [place] then please contact us. The fastest way to get your results is to register for My Chart. ? ? ?IF you received an x-ray today, you will receive an invoice from [facility]. Please contact [facility] at [phone] with questions or concerns regarding your invoice.  ? ?IF you received labwork today, you will receive an invoice from [place]. Please contact [facility] at [phone] with questions or concerns regarding your invoice.  ? ?Our billing staff will not be able to assist you with questions regarding bills from these companies. ? ?You will be contacted with the lab results as soon as they are available. The fastest way to get your results is to activate your My Chart account. Instructions are located on the last page of this paperwork. If you have not heard from [place] regarding the results in 2 weeks, please contact this office. ?  ? ? ?

## 2021-10-13 ENCOUNTER — Telehealth: Payer: Self-pay | Admitting: *Deleted

## 2021-10-13 LAB — URINE CULTURE
MICRO NUMBER:: 13248975
SPECIMEN QUALITY:: ADEQUATE

## 2021-10-13 MED ORDER — SULFAMETHOXAZOLE-TRIMETHOPRIM 800-160 MG PO TABS: 1.0000 | ORAL_TABLET | Freq: Two times a day (BID) | ORAL | 0 refills | Status: DC

## 2021-10-13 NOTE — Telephone Encounter (Signed)
Patient called and states that rx for bactrim has not been sent into pharmacy from yesterdays appointment. She would like this sent in to the [facility] on [address] as listed in chart.  ?

## 2021-10-13 NOTE — Telephone Encounter (Signed)
Sent ? ?Thanks, ? ?Rich

## 2021-10-13 NOTE — Addendum Note (Signed)
Addended by: Maximiano Coss on: 10/13/2021 09:19 AM ? ? Modules accepted: Orders ? ?

## 2021-10-18 ENCOUNTER — Telehealth: Payer: Self-pay

## 2021-10-18 DIAGNOSIS — R310 Gross hematuria: Secondary | ICD-10-CM

## 2021-10-18 NOTE — Telephone Encounter (Addendum)
Patient called in stating she was seen last week for blood in urine. Was advised to call us if there has been no changes. Kelly Mcdowell finished antibiotics and her urine was clear for the first day but the blood has returned. Wanting to know whats next. Kelly Mcdowell says she noticed that blood in urine is a side effect from xarelto. Wants to know if this could be her problem.  ?

## 2021-10-19 ENCOUNTER — Encounter: Payer: Self-pay | Admitting: Family Medicine

## 2021-10-19 ENCOUNTER — Encounter: Payer: Self-pay | Admitting: Cardiology

## 2021-10-19 ENCOUNTER — Ambulatory Visit: Payer: Medicare Other

## 2021-10-19 ENCOUNTER — Telehealth: Payer: Self-pay | Admitting: Family Medicine

## 2021-10-19 DIAGNOSIS — R319 Hematuria, unspecified: Secondary | ICD-10-CM

## 2021-10-19 NOTE — Telephone Encounter (Signed)
Pt notes Dr Antoine Poche office thinks xarelto could be causing some worsening of blood in the urine but would not be the cause.  ? ? ?

## 2021-10-19 NOTE — Telephone Encounter (Signed)
Called pt  blood in urine last week ,then cleared during the day. More blood noted 2 nights ago - then clear during the day. Blood comes and goes for a few days. No fever, abd pain, dysuria, frequency, or urgency. Some soreness in R back if stooped over only. No new back pain otherwise. Some hematuria this am, now clearing. No near syncope, lightheadeddness or new sx's.  ? ?Last urine culture did not show specific infection.  Blood could be due to use of blood thinner, but would like to refer her to urology either way.  Check repeat u/a, cx today, and referral placed. May need to stop xarelto for now - she will call her cardiologist today to discuss stopping xarelto temporarily.  ? ?

## 2021-10-19 NOTE — Addendum Note (Signed)
Addended by: Meredith Staggers R on: 10/19/2021 11:44 AM ? ? Modules accepted: Orders ? ?

## 2021-10-19 NOTE — Telephone Encounter (Signed)
Patient called back about blood in her urine. She called yesterday but hasn't heard back yet. I did tell her that Kelly Mcdowell was out sick and that [name] was also sent the message. She is very concerned. Please give patient a call today.  ?

## 2021-10-19 NOTE — Telephone Encounter (Signed)
Was advised to make return visit if not improved, we will do repeat testing   ?

## 2021-10-19 NOTE — Telephone Encounter (Signed)
Patient came in to give urine sample, she stated that if she needs a referral to Urology she would like to go to Dr Patsi Sears. ?

## 2021-10-21 NOTE — Telephone Encounter (Signed)
Did you call and speak with patient regarding this matter. ?

## 2021-10-21 NOTE — Telephone Encounter (Signed)
Pt has continued blood thinners, urine was not processed due to multiple errors how would you like to proceed have her return with another sample or make an appointment for follow up? ? ?

## 2021-10-22 NOTE — Telephone Encounter (Signed)
If she is still having significant blood in the urine may need to temporarily stop the blood thinner.   ?As she is not going to see urology until May 5, may still be best to have repeat urine culture and urinalysis.  If she can come by today or at the latest Monday, please have her do so.  Apologize for the inconvenience with last test not being completed. ?

## 2021-10-24 NOTE — Telephone Encounter (Signed)
Repeat U/A Monday and cx would be good. Thanks.  ?

## 2021-10-27 ENCOUNTER — Other Ambulatory Visit (INDEPENDENT_AMBULATORY_CARE_PROVIDER_SITE_OTHER): Payer: Medicare Other

## 2021-10-27 DIAGNOSIS — R319 Hematuria, unspecified: Secondary | ICD-10-CM

## 2021-10-27 LAB — POCT URINALYSIS DIP (MANUAL ENTRY)
Bilirubin, UA: NEGATIVE
Glucose, UA: NEGATIVE mg/dL
Ketones, POC UA: NEGATIVE mg/dL
Nitrite, UA: NEGATIVE
Protein Ur, POC: NEGATIVE mg/dL
Spec Grav, UA: 1.01 (ref 1.010–1.025)
Urobilinogen, UA: 0.2 E.U./dL
pH, UA: 5 (ref 5.0–8.0)

## 2021-10-27 NOTE — Progress Notes (Signed)
Patient returned to office to urine sample drop off. Culture and POCT U/A ordered. Per Lab Eunice Blase) Culture was performed and sent off. CMA Thea Silversmith) performed the UA and documented. Awaiting culture results.  ?

## 2021-10-28 ENCOUNTER — Encounter: Payer: Self-pay | Admitting: Family Medicine

## 2021-10-28 LAB — URINE CULTURE
MICRO NUMBER:: 13315478
SPECIMEN QUALITY:: ADEQUATE

## 2021-11-05 DIAGNOSIS — R310 Gross hematuria: Secondary | ICD-10-CM | POA: Diagnosis not present

## 2021-11-08 DIAGNOSIS — R310 Gross hematuria: Secondary | ICD-10-CM | POA: Diagnosis not present

## 2021-11-12 DIAGNOSIS — R310 Gross hematuria: Secondary | ICD-10-CM | POA: Diagnosis not present

## 2021-11-12 DIAGNOSIS — R319 Hematuria, unspecified: Secondary | ICD-10-CM | POA: Diagnosis not present

## 2021-12-01 ENCOUNTER — Ambulatory Visit (INDEPENDENT_AMBULATORY_CARE_PROVIDER_SITE_OTHER): Payer: Medicare Other | Admitting: Cardiology

## 2021-12-01 ENCOUNTER — Encounter: Payer: Self-pay | Admitting: Cardiology

## 2021-12-01 VITALS — BP 136/70 | HR 60 | Ht 62.0 in | Wt 119.0 lb

## 2021-12-01 DIAGNOSIS — I4891 Unspecified atrial fibrillation: Secondary | ICD-10-CM | POA: Diagnosis not present

## 2021-12-01 MED ORDER — RIVAROXABAN 20 MG PO TABS: 20.0000 mg | ORAL_TABLET | Freq: Every day | ORAL | 5 refills | Status: DC

## 2021-12-01 NOTE — Progress Notes (Signed)
Cardiology Office Note   Date:  12/01/2021   ID:  Kelly Mcdowell September 09, 1940, MRN [id number]  PCP:  [name], MD  Cardiologist:   Rollene Rotunda, MD Referring:  [name], MD  Chief Complaint  Patient presents with   Atrial Fibrillation      History of Present Illness: Kelly Mcdowell is a 81 y.o. female who presents for evaluation of atrial fibrillation.  I sent her for an echo and she had normal LV function with mild AI.   She has her hypothyroidism followed by [name], MD   Since I last saw her she has done well.  The patient denies any new symptoms such as chest discomfort, neck or arm discomfort. There has been no new shortness of breath, PND or orthopnea. There have been no reported palpitations, presyncope or syncope.   Of note she did have hematuria and had a GU evaluation coming off the blood thinner for short period.  She has now been back on this since earlier this month.  Thus far no further hematuria is noted.  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 Take once daily x 6 weeks after surgery IF still only taking one eliquis instead of 2. 42 tablet 0   cholecalciferol (VITAMIN D3) 25 MCG (1000 UNIT) tablet Take 2,000 Units by mouth daily.     famotidine (PEPCID) 20 MG tablet  Take 20 mg by mouth daily as needed for heartburn or indigestion.     fluticasone (FLONASE) 50 MCG/ACT nasal spray Place 1 spray into both nostrils daily. 16 g 6   hydrOXYzine (ATARAX/VISTARIL) 25 MG tablet Take 1/2 tablet to one whole tablet by mouth daily as needed for itching 30 tablet 1   levothyroxine (SYNTHROID) 88 MCG tablet Take 1 tablet (88 mcg total) by mouth daily before breakfast. 3days per week - alternating with 1/2 tablet on other 4 days. 90 tablet 2   Menthol, Topical Analgesic, (BIOFREEZE ROLL-ON) 4 % GEL Apply 1 application. topically daily as needed (pain).     sulfamethoxazole-trimethoprim (BACTRIM DS) 800-160 MG tablet Take 1 tablet by mouth 2 (two) times daily. 10 tablet 0   rivaroxaban (XARELTO) 20 MG TABS tablet Take 1 tablet (20 mg total) by mouth daily with supper. 30 tablet 5   No current facility-administered medications for this visit.    Allergies:   Patient has no known allergies.    ROS:  Please see the history of present illness.   Otherwise, review of systems are positive for none.   All other systems are reviewed and negative.    PHYSICAL EXAM: VS:  BP 136/70 (  BP Location: Left Arm, Patient Position: Sitting, Cuff Size: Normal)   Pulse 60   Ht 5\' 2"  (1.575 m)   Wt 119 lb (54 kg)   BMI 21.77 kg/m  , BMI Body mass index is 21.77 kg/m. GENERAL:  Well appearing NECK:  No jugular venous distention, waveform within normal limits, carotid upstroke brisk and symmetric, no bruits, no thyromegaly LUNGS:  Clear to auscultation bilaterally CHEST:  Unremarkable HEART:  PMI not displaced or sustained,S1 and S2 within normal limits, no S3, no S4, no clicks, no rubs, very soft diastolic murmur heard only in the third left intercostal space during deep expiration and murmurs ABD:  Flat, positive bowel sounds normal in frequency in pitch, no bruits, no rebound, no guarding, no midline pulsatile mass, no hepatomegaly, no splenomegaly EXT:  2 plus pulses throughout, no  edema, no cyanosis no clubbing   EKG:  EKG is ordered today. Sinus rhythm, rate 60, premature atrial contractions, no acute ST-T wave changes.   Recent Labs: 12/09/2020: ALT 14 12/15/2020: BUN 14; Creatinine, Ser 0.71; Hemoglobin 12.3; Platelets 215; Potassium 3.4; Sodium 135 08/16/2021: TSH 0.86    Lipid Panel    Component Value Date/Time   CHOL 271 (H) 05/09/2017 1202   TRIG 64 05/09/2017 1202   HDL 70 05/09/2017 1202   CHOLHDL 3.9 05/09/2017 1202   LDLCALC 188 (H) 05/09/2017 1202      Wt Readings from Last 3 Encounters:  12/01/21 119 lb (54 kg)  10/12/21 119 lb 3.2 oz (54.1 kg)  08/16/21 117 lb 12.8 oz (53.4 kg)    Take care  Other studies Reviewed: Additional studies/ records that were reviewed today include: Labs Review of the above records demonstrates:  Please see elsewhere in the note.     ASSESSMENT AND PLAN:   ATRIAL FIB:   The patient had paroxysmal atrial fibrillation.  Her CHA2DS2-VASc is 3.  No change in therapy.  HYPOTHYROIDISM: This is being regulated by 08/18/21, MD.    AI:   This was mild on echo a few years ago.  I do not suspect this is changed clinically.  No further imaging at this point.  I will probably repeat at the 5-year mark.   Current medicines are reviewed at length with the patient today.  The patient does not have concerns regarding medicines.  The following changes have been made:  None  Labs/ tests ordered today include:  None Orders Placed This Encounter  Procedures   EKG 12-Lead     Disposition:   FU with me in 12 months.   Signed, [name], MD  12/01/2021 12:27 PM    [facility]

## 2021-12-01 NOTE — Patient Instructions (Signed)

## 2021-12-08 ENCOUNTER — Telehealth: Payer: Self-pay | Admitting: Family Medicine

## 2021-12-08 NOTE — Telephone Encounter (Signed)
Left message for patient to call back and schedule Medicare Annual Wellness Visit (AWV).   Please offer to do virtually or by telephone.  Left office number and my jabber [phone].  Last AWV:06/16/2020  Please schedule at anytime with Nurse Health Advisor.

## 2022-01-07 ENCOUNTER — Telehealth: Payer: Self-pay | Admitting: Orthopaedic Surgery

## 2022-01-13 ENCOUNTER — Encounter: Payer: Self-pay | Admitting: Orthopaedic Surgery

## 2022-01-13 ENCOUNTER — Ambulatory Visit (INDEPENDENT_AMBULATORY_CARE_PROVIDER_SITE_OTHER): Payer: Medicare Other

## 2022-01-13 ENCOUNTER — Ambulatory Visit (INDEPENDENT_AMBULATORY_CARE_PROVIDER_SITE_OTHER): Payer: Medicare Other | Admitting: Orthopaedic Surgery

## 2022-01-13 DIAGNOSIS — Z96641 Presence of right artificial hip joint: Secondary | ICD-10-CM

## 2022-01-13 DIAGNOSIS — M25552 Pain in left hip: Secondary | ICD-10-CM

## 2022-01-13 MED ORDER — BUPIVACAINE HCL 0.5 % IJ SOLN
3.0000 mL | INTRAMUSCULAR | Status: AC | PRN
  Administered 2022-01-13: 3 mL via INTRA_ARTICULAR

## 2022-01-13 MED ORDER — LIDOCAINE HCL 1 % IJ SOLN
3.0000 mL | INTRAMUSCULAR | Status: AC | PRN
  Administered 2022-01-13: 3 mL

## 2022-01-13 MED ORDER — METHYLPREDNISOLONE ACETATE 40 MG/ML IJ SUSP
40.0000 mg | INTRAMUSCULAR | Status: AC | PRN
  Administered 2022-01-13: 40 mg via INTRA_ARTICULAR

## 2022-01-13 NOTE — Progress Notes (Signed)
Office Visit Note   Patient: Kelly Mcdowell           Date of Birth: 21-Nov-1940           MRN: [id number] Visit Date: 01/13/2022              Requested by: [name], MD [address] [place] [address] [place],  [place] [address] PCP: [name], MD   Assessment & Plan: Visit Diagnoses:  1. Pain in left hip   2. 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  Has a slight leg length discrepancy since she has had a right hip replacement which may be aggravating things.  Based on treatment options she elected for a trochanteric injection as well as home stretches.  Follow-up as needed.  Follow-Up Instructions: No follow-ups on file.   Orders:  Orders Placed This Encounter  Procedures   XR HIP UNILAT W OR W/O PELVIS 2-3 VIEWS LEFT   No orders of the defined types were placed in this encounter.     Procedures: Large Joint Inj: L greater trochanter on 01/13/2022 11:20 AM Indications: pain Details: 22 G needle Medications: 3 mL lidocaine 1 %; 3 mL bupivacaine 0.5 %; 40 mg methylPREDNISolone acetate 40 MG/ML Outcome: tolerated well, no immediate complications Patient was prepped and draped in the usual sterile fashion.       Clinical Data: No additional findings.   Subjective: Chief Complaint  Patient presents with   Left Hip - Pain   Right Hip - Follow-up    THA 12/2020    HPI Kelly Mcdowell comes in today for lateral left hip pain for 1 month.  Denies any injuries.  Right hip replacement has done very well.  Denies any back pain or groin pain.  Has done some increased walking recently.  Review of Systems  Constitutional: Negative.   HENT: Negative.    Eyes: Negative.   Respiratory: Negative.    Cardiovascular: Negative.   Endocrine: Negative.   Musculoskeletal: Negative.   Neurological: Negative.   Hematological: Negative.   Psychiatric/Behavioral: Negative.    All other systems reviewed and  are negative.    Objective: Vital Signs: There were no vitals taken for this visit.  Physical Exam Vitals and nursing note reviewed.  Constitutional:      Appearance: She is well-developed.  Pulmonary:     Effort: Pulmonary effort is normal.  Skin:    General: Skin is warm.     Capillary Refill: Capillary refill takes less than 2 seconds.  Neurological:     Mental Status: She is alert and oriented to person, place, and time.  Psychiatric:        Behavior: Behavior normal.        Thought Content: Thought content normal.        Judgment: Judgment normal.     Ortho Exam Examination of the left hip shows point tenderness to the lateral aspect of the greater trochanter.  Range of motion is well-tolerated.  No sciatic tension signs.  Slight shortening of the left lower extremity compared to right lower extremity. Specialty Comments:  No specialty comments available.  Imaging: XR HIP UNILAT W OR W/O PELVIS 2-3 VIEWS LEFT  Result Date: 01/13/2022 Stable right total hip replacement without complication.   No acute abnormalities of the left hip joint.    PMFS History: Patient Active Problem List   Diagnosis Date Noted  Status post total replacement of right hip 12/14/2020   Primary osteoarthritis of right hip 09/29/2020   Primary osteoarthritis of left hip 09/29/2020   Educated about COVID-19 virus infection 02/20/2020   Atrial fibrillation (HCC) 02/20/2020   Age-related osteoporosis without current pathological fracture 03/04/2019   Hypothyroidism 12/15/2016   Hip joint pain 09/16/2013   Lumbar pain with radiation down left leg 09/16/2013   Hypothyroidism (acquired) 06/18/2012   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   Thyroid disease    UTI (urinary tract infection)     Family History  Problem  Relation Age of Onset   Congestive Heart Failure Father    Cancer Sister        breast   Breast cancer Sister    Colon polyps Sister    Cancer Maternal Grandmother    Breast cancer Maternal Aunt    Stomach cancer Maternal Aunt    Colon cancer Neg Hx    Esophageal cancer Neg Hx    Rectal cancer Neg Hx     Past Surgical History:  Procedure Laterality Date   CESAREAN SECTION     COLONOSCOPY     TOTAL HIP ARTHROPLASTY Right 12/14/2020   Procedure: RIGHT TOTAL HIP ARTHROPLASTY ANTERIOR APPROACH;  Surgeon: Tarry Kos, MD;  Location: [facility] OR;  Service: Orthopedics;  Laterality: Right;  3-C   TUBAL LIGATION     Social History   Occupational History   Not on file  Tobacco Use   Smoking status: Former    Types: Cigarettes    Quit date: 03/04/2005    Years since quitting: 16.8   Smokeless tobacco: Never  Vaping Use   Vaping Use: Never used  Substance and Sexual Activity   Alcohol use: No   Drug use: No   Sexual activity: Never    Birth control/protection: Surgical

## 2022-01-28 ENCOUNTER — Telehealth: Payer: Self-pay | Admitting: *Deleted

## 2022-01-28 NOTE — Telephone Encounter (Signed)
Ortho bundle 1 year call to patient S/P Right THA.

## 2022-02-14 ENCOUNTER — Ambulatory Visit: Payer: Medicare Other | Admitting: Family Medicine

## 2022-02-21 ENCOUNTER — Encounter: Payer: Self-pay | Admitting: Family Medicine

## 2022-02-21 ENCOUNTER — Ambulatory Visit (INDEPENDENT_AMBULATORY_CARE_PROVIDER_SITE_OTHER): Payer: Medicare Other | Admitting: Family Medicine

## 2022-02-21 VITALS — BP 122/68 | HR 58 | Temp 97.5°F | Resp 16 | Ht 62.0 in | Wt 120.2 lb

## 2022-02-21 DIAGNOSIS — E039 Hypothyroidism, unspecified: Secondary | ICD-10-CM | POA: Diagnosis not present

## 2022-02-21 DIAGNOSIS — E876 Hypokalemia: Secondary | ICD-10-CM | POA: Diagnosis not present

## 2022-02-21 DIAGNOSIS — R040 Epistaxis: Secondary | ICD-10-CM

## 2022-02-21 DIAGNOSIS — R0982 Postnasal drip: Secondary | ICD-10-CM

## 2022-02-21 DIAGNOSIS — I4891 Unspecified atrial fibrillation: Secondary | ICD-10-CM

## 2022-02-21 DIAGNOSIS — L299 Pruritus, unspecified: Secondary | ICD-10-CM

## 2022-02-21 LAB — BASIC METABOLIC PANEL
BUN: 14 mg/dL (ref 6–23)
CO2: 24 mEq/L (ref 19–32)
Calcium: 9.1 mg/dL (ref 8.4–10.5)
Chloride: 103 mEq/L (ref 96–112)
Creatinine, Ser: 0.82 mg/dL (ref 0.40–1.20)
GFR: 68.31 mL/min (ref >60.00)
Glucose, Bld: 80 mg/dL (ref 70–99)
Potassium: 4 mEq/L (ref 3.5–5.1)
Sodium: 140 mEq/L (ref 135–145)

## 2022-02-21 LAB — TSH: TSH: 2.57 u[IU]/mL (ref 0.35–5.50)

## 2022-02-21 MED ORDER — LEVOTHYROXINE SODIUM 88 MCG PO TABS: 88.0000 ug | ORAL_TABLET | Freq: Every day | ORAL | 2 refills | Status: DC

## 2022-02-21 MED ORDER — HYDROXYZINE HCL 25 MG PO TABS: ORAL_TABLET | ORAL | 1 refills | Status: DC

## 2022-02-21 NOTE — Progress Notes (Signed)
Subjective:  Patient ID: Kelly Mcdowell, female    DOB: 22-Mar-1941  Age: 81 y.o. MRN: [id number]  CC:  Chief Complaint  Patient presents with   Hypothyroidism   Pruritis    Refill hydrox for itching     HPI Kelly Mcdowell presents for   Hypothyroidism: Lab Results  Component Value Date   TSH 0.86 08/16/2021  Taking medication daily-with alternating doses of Synthroid 88 mcg for 3 days a week, 44 mcg on other 4 days/week. No new hot or cold intolerance. Possible increased hair removal with brushing hair past month. No skin changes, heart palpitations or new fatigue. No new weight changes.   Atrial fibrillation Paroxysmal.  CHA2DS2-VASc score of 3.  Has not required rate control, treated with Xarelto for anticoagulation.  Appointment noted May 31 with her cardiologist.Mild aortic insufficiency, plan on repeat echo at 5-year mark. Rare palpitations at night. HR has been normal at home. No bleeding or new bruising. No gross hematuria noted. Saw urology - no concerns. Normal cystoscopy 5//23. Dr. Retta Diones.   Pruritus/dry skin dermatitis Hydrating measures have discussed in the past including Mehta fire, lotions, avoidance of drying soaps.  Hydroxyzine as needed for pruritus - few times per month, no side effects, dizziness or unsteadiness  when taken. Using lotion on skin.   Hypokalemia: Noted on 12/15/20. No current supplement.   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 09/16/2013   Hypothyroidism (acquired) 06/18/2012   Past Medical History:   Diagnosis Date   Allergy    Arthritis    Cataract    both eyes   Dysrhythmia    afib   Hypothyroidism    Leaky heart valve    Osteoporosis    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 Authorizing Provider  aspirin EC 81 MG tablet Take 1 tablet (81 mg total) by mouth daily. Take once daily x 6 weeks after surgery IF still only taking one eliquis instead of 2. 12/13/20  Yes Cristie Hem, PA-C  cholecalciferol (VITAMIN D3) 25 MCG (1000 UNIT) tablet Take 2,000 Units by mouth daily.   Yes [provider]  famotidine (PEPCID) 20 MG tablet Take 20 mg by mouth daily as needed for heartburn or indigestion.   Yes [provider]  fluticasone (FLONASE) 50 MCG/ACT nasal spray Place 1 spray into both nostrils daily. 08/16/21  Yes [name], MD  hydrOXYzine (ATARAX/VISTARIL) 25 MG tablet Take 1/2 tablet to one whole tablet by mouth daily as needed for itching 05/30/17  Yes [name], MD  levothyroxine (SYNTHROID)  88 MCG tablet Take 1 tablet (88 mcg total) by mouth daily before breakfast. 3days per week - alternating with 1/2 tablet on other 4 days. 08/16/21  Yes Wendie Agreste, MD  Menthol, Topical Analgesic, (BIOFREEZE ROLL-ON) 4 % GEL Apply 1 application. topically daily as needed (pain).   Yes [provider]  rivaroxaban (XARELTO) 20 MG TABS tablet Take 1 tablet (20 mg total) by mouth daily with supper. 12/01/21  Yes Minus Breeding, MD  sulfamethoxazole-trimethoprim (BACTRIM DS) 800-160 MG tablet Take 1  tablet by mouth 2 (two) times daily. Patient not taking: Reported on 02/21/2022 10/13/21   Maximiano Coss, NP   Social History   Socioeconomic History   Marital status: Married    Spouse name: Not on file   Number of children: Not on file   Years of education: Not on file   Highest education level: Not on file  Occupational History   Not on file  Tobacco Use   Smoking status: Former    Types: Cigarettes    Quit date: 03/04/2005    Years since quitting: 16.9   Smokeless tobacco: Never  Vaping Use   Vaping Use: Never used  Substance and Sexual Activity   Alcohol use: No   Drug use: No   Sexual activity: Never    Birth control/protection: Surgical  Other Topics Concern   Not on file  Social History Narrative   Married and lives with husband.  Three daughters.  12 grands.     Social Determinants of Health   Financial Resource Strain: Not on file  Food Insecurity: Not on file  Transportation Needs: Not on file  Physical Activity: Not on file  Stress: Not on file  Social Connections: Not on file  Intimate Partner Violence: Not on file    Review of Systems   Objective:   Vitals:   02/21/22 1132  BP: 122/68  Pulse: (!) 58  Resp: 16  Temp: (!) 97.5 F (36.4 C)  TempSrc: Oral  SpO2: 97%  Weight: 120 lb 3.2 oz (54.5 kg)  Height: 5\' 2"  (1.575 m)     Physical Exam Vitals reviewed.  Constitutional:      Appearance: Normal appearance. She is well-developed.  HENT:     Head: Normocephalic and atraumatic.  Eyes:     Conjunctiva/sclera: Conjunctivae normal.     Pupils: Pupils are equal, round, and reactive to light.  Neck:     Vascular: No carotid bruit.     Comments: No thyromegaly/nodules appreciated. Cardiovascular:     Rate and Rhythm: Normal rate and regular rhythm.     Heart sounds: Normal heart sounds.  Pulmonary:     Effort: Pulmonary effort is normal.     Breath sounds: Normal breath sounds.  Abdominal:     Palpations: Abdomen is soft. There is no  pulsatile mass.     Tenderness: There is no abdominal tenderness.  Musculoskeletal:     Cervical back: Neck supple.     Right lower leg: No edema.     Left lower leg: No edema.  Skin:    General: Skin is warm and dry.     Findings: No bruising, erythema or rash.  Neurological:     Mental Status: She is alert and oriented to person, place, and time.  Psychiatric:        Mood and Affect: Mood normal.        Behavior: Behavior normal.        Assessment & Plan:  Kelly Mcdowell is a 81 y.o. female . Hypokalemia - Plan: Basic metabolic panel  -Borderline on prior labs.  Repeat BMP.  If still borderline, dietary supplementation would likely be sufficient.  Itching - Plan: hydrOXYzine (ATARAX) 25 MG tablet  -With possible dry skin dermatitis, continue lotion, lowest effective dose of hydroxyzine discussed current dosing.  Potential side effects and risk discussed.  Hypothyroidism, unspecified type - Plan: levothyroxine (SYNTHROID) 88 MCG tablet, TSH  -Reports some hair loss as above, check TSH, continue same dosing of levothyroxine for now.  If TSH is normal and continued hair loss would recommend dermatology visit.  Atrial fibrillation, unspecified type (HCC)  -Stable, tolerating anticoagulation with recent cardiology visit no changes.  Meds ordered this encounter  Medications   levothyroxine (SYNTHROID) 88 MCG tablet    Sig: Take 1 tablet (88 mcg total) by mouth daily before breakfast. 3days per week - alternating with 1/2 tablet on other 4 days.    Dispense:  90 tablet    Refill:  2   hydrOXYzine (ATARAX) 25 MG tablet    Sig: Take 1/2 tablet to one whole tablet by mouth daily as needed for itching    Dispense:  30 tablet    Refill:  1   Patient Instructions  Okay to use hydroxyzine sparingly, start with half pill to see if that is just as effective.  Watch for any side effects on that medication.  Continue to use hydrating lotion to any dry areas as that can also help with  itching.  If thyroid test is normal, I am happy to refer you to dermatology if you continue to have more frequent hair loss.  Let me know.  No med changes today.  I will recheck potassium, and if it is still borderline we can usually treat that with potassium rich foods in the diet.  Again I will let you know once I review your results.  Take care    Signed,   Meredith Staggers, MD [facility], [facility] 02/21/22 12:13 PM

## 2022-02-21 NOTE — Patient Instructions (Signed)
Okay to use hydroxyzine sparingly, start with half pill to see if that is just as effective.  Watch for any side effects on that medication.  Continue to use hydrating lotion to any dry areas as that can also help with itching.  If thyroid test is normal, I am happy to refer you to dermatology if you continue to have more frequent hair loss.  Let me know.  No med changes today.  I will recheck potassium, and if it is still borderline we can usually treat that with potassium rich foods in the diet.  Again I will let you know once I review your results.  Take care

## 2022-02-23 ENCOUNTER — Encounter (INDEPENDENT_AMBULATORY_CARE_PROVIDER_SITE_OTHER): Payer: Self-pay

## 2022-02-23 DIAGNOSIS — H35373 Puckering of macula, bilateral: Secondary | ICD-10-CM | POA: Diagnosis not present

## 2022-02-23 DIAGNOSIS — Z961 Presence of intraocular lens: Secondary | ICD-10-CM | POA: Diagnosis not present

## 2022-02-23 DIAGNOSIS — H40033 Anatomical narrow angle, bilateral: Secondary | ICD-10-CM | POA: Diagnosis not present

## 2022-03-10 ENCOUNTER — Ambulatory Visit (INDEPENDENT_AMBULATORY_CARE_PROVIDER_SITE_OTHER): Payer: Medicare Other

## 2022-03-10 ENCOUNTER — Ambulatory Visit (INDEPENDENT_AMBULATORY_CARE_PROVIDER_SITE_OTHER): Payer: Medicare Other | Admitting: Orthopaedic Surgery

## 2022-03-10 DIAGNOSIS — Z96641 Presence of right artificial hip joint: Secondary | ICD-10-CM | POA: Diagnosis not present

## 2022-03-10 NOTE — Progress Notes (Signed)
   Post-Op Visit Note   Patient: Kelly Mcdowell           Date of Birth: [birth year]           MRN: [id number] Visit Date: 03/10/2022 PCP: [name], MD   Assessment & Plan:  Chief Complaint:  Chief Complaint  Patient presents with   Left Hip - Pain   Visit Diagnoses:  1. History of total hip replacement, right     Plan: Patient is a pleasant 81 year old female who comes in today 1 year status post right total hip replacement 12/14/2020.  She has been doing well.  No complaints.  Examination right hip reveals painless hip flexion and logroll.  She is neurovascular intact distally.  At this point, she will continue to advance activities as tolerated.  Dental prophylaxis reinforced for another year.  Follow-up in 1 year for repeat evaluation and AP pelvis x-rays.  Call with concerns or questions.  Follow-Up Instructions: Return in about 1 year (around 03/11/2023).   Orders:  Orders Placed This Encounter  Procedures   XR Pelvis 1-2 Views   No orders of the defined types were placed in this encounter.   Imaging: No results found.  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/14/2020   Primary osteoarthritis of right hip 09/29/2020   Primary osteoarthritis of left hip 09/29/2020   Educated about COVID-19 virus infection 02/20/2020   Atrial fibrillation (HCC) 02/20/2020   Age-related osteoporosis without current pathological fracture 03/04/2019   Hypothyroidism 12/15/2016   Hip joint pain 09/16/2013   Lumbar pain with radiation down left leg 09/16/2013   Hypothyroidism (acquired) 06/18/2012   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   Thyroid disease    UTI (urinary tract infection)     Family History  Problem Relation Age of  Onset   Congestive Heart Failure Father    Cancer Sister        breast   Breast cancer Sister    Colon polyps Sister    Cancer Maternal Grandmother    Breast cancer Maternal Aunt    Stomach cancer Maternal Aunt    Colon cancer Neg Hx    Esophageal cancer Neg Hx    Rectal cancer Neg Hx     Past Surgical History:  Procedure Laterality Date   CESAREAN SECTION     COLONOSCOPY     TOTAL HIP ARTHROPLASTY Right 12/14/2020   Procedure: RIGHT TOTAL HIP ARTHROPLASTY ANTERIOR APPROACH;  Surgeon: Tarry Kos, MD;  Location: [facility] OR;  Service: Orthopedics;  Laterality: Right;  3-C   TUBAL LIGATION     Social History   Occupational History   Not on file  Tobacco Use   Smoking status: Former    Types: Cigarettes    Quit date: 03/04/2005    Years since quitting: 17.0   Smokeless tobacco: Never  Vaping Use   Vaping Use: Never used  Substance and Sexual Activity   Alcohol use: No   Drug use: No   Sexual activity: Never    Birth control/protection: Surgical

## 2022-03-21 ENCOUNTER — Ambulatory Visit (INDEPENDENT_AMBULATORY_CARE_PROVIDER_SITE_OTHER): Payer: Medicare Other | Admitting: Ophthalmology

## 2022-03-21 ENCOUNTER — Encounter (INDEPENDENT_AMBULATORY_CARE_PROVIDER_SITE_OTHER): Payer: Self-pay | Admitting: Ophthalmology

## 2022-03-21 DIAGNOSIS — H43813 Vitreous degeneration, bilateral: Secondary | ICD-10-CM | POA: Insufficient documentation

## 2022-03-21 DIAGNOSIS — H35341 Macular cyst, hole, or pseudohole, right eye: Secondary | ICD-10-CM

## 2022-03-21 DIAGNOSIS — H35372 Puckering of macula, left eye: Secondary | ICD-10-CM | POA: Diagnosis not present

## 2022-03-21 DIAGNOSIS — H35371 Puckering of macula, right eye: Secondary | ICD-10-CM | POA: Diagnosis not present

## 2022-03-21 NOTE — Assessment & Plan Note (Signed)
Significant impact on visual acuity OD.  So with floaters in the right eye.  I explained the patient that vitrectomy membrane peel remove the wrinkle which will allow for stabilization and improvement acuity of the maximum that is a possible and has a beneficial side effect of the floaters in that same right eye would be removed.

## 2022-03-21 NOTE — Assessment & Plan Note (Signed)
OS minor epiretinal membrane will continue to observe

## 2022-03-21 NOTE — Assessment & Plan Note (Signed)
No peripheral retinal holes or tears yet symptomatic visual floaters more pronounced left eye than right

## 2022-03-21 NOTE — Assessment & Plan Note (Signed)
Early is tach noted by tangential traction triggering schisis and outer retinal photoreceptor changes but negative Watzke at this time

## 2022-03-21 NOTE — Progress Notes (Signed)
03/21/2022     CHIEF COMPLAINT Patient presents for  Chief Complaint  Patient presents with   Retina Evaluation      HISTORY OF PRESENT ILLNESS: Kelly Mcdowell is a 81 y.o. female who presents to the clinic today for:   HPI     Retina Evaluation           Laterality: both eyes   Associated Symptoms: Floaters.  Negative for Flashes, Distortion, Blind Spot, Pain, Redness, Photophobia, Glare, Trauma, Scalp Tenderness, Jaw Claudication, Shoulder/Hip pain, Fever, Weight Loss and Fatigue         Comments   NP- ERM OD Possible micro hole ref by C. Groat. "Ive got scar tissue in my right eye and Ive seen floaters in my left. Ive had cataracts removed in both eyes last year by Dr. Marchelle Gearing and my vision seems to be doing better but its gradually getting worse. Esp in my right eye" Pt denies pain but feels an aching sensation in both eyes. Pt has sensitivity to light.  Pt wanted to know if removing scar tissue in OD improve vision.      Last edited by Angeline Slim on 03/21/2022 11:04 AM.      Referring physician: Sallye Lat, MD [address] [place],  [place] [address]  HISTORICAL INFORMATION:   Selected notes from the MEDICAL RECORD NUMBER       CURRENT MEDICATIONS: No current outpatient medications on file. (Ophthalmic Drugs)   No current facility-administered medications for this visit. (Ophthalmic Drugs)   Current Outpatient Medications (Other)  Medication Sig   aspirin EC 81 MG tablet Take 1 tablet (81 mg total) by mouth daily. Take once daily x 6 weeks after surgery IF still only taking one eliquis instead of 2.   cholecalciferol (VITAMIN D3) 25 MCG (1000 UNIT) tablet Take 2,000 Units by mouth daily.   famotidine (PEPCID) 20 MG tablet Take 20 mg by mouth daily as needed for heartburn or indigestion.   fluticasone (FLONASE) 50 MCG/ACT nasal spray Place 1 spray into both nostrils daily.   hydrOXYzine (ATARAX) 25 MG tablet Take 1/2 tablet to one  whole tablet by mouth daily as needed for itching   levothyroxine (SYNTHROID) 88 MCG tablet Take 1 tablet (88 mcg total) by mouth daily before breakfast. 3days per week - alternating with 1/2 tablet on other 4 days.   Menthol, Topical Analgesic, (BIOFREEZE ROLL-ON) 4 % GEL Apply 1 application. topically daily as needed (pain).   rivaroxaban (XARELTO) 20 MG TABS tablet Take 1 tablet (20 mg total) by mouth daily with supper.   No current facility-administered medications for this visit. (Other)      REVIEW OF SYSTEMS: ROS   Negative for: Constitutional, Gastrointestinal, Neurological, Skin, Genitourinary, Musculoskeletal, HENT, Endocrine, Cardiovascular, Eyes, Respiratory, Psychiatric, Allergic/Imm, Heme/Lymph Last edited by Angeline Slim on 03/21/2022 10:55 AM.       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   Thyroid disease    UTI (urinary tract infection)    Past Surgical History:  Procedure Laterality Date   CESAREAN SECTION     COLONOSCOPY     TOTAL HIP ARTHROPLASTY Right 12/14/2020   Procedure: RIGHT  TOTAL HIP ARTHROPLASTY ANTERIOR APPROACH;  Surgeon: Tarry Kos, MD;  Location: [facility] OR;  Service: Orthopedics;  Laterality: Right;  3-C   TUBAL LIGATION      FAMILY HISTORY Family History  Problem Relation Age of Onset   Congestive Heart Failure Father    Cancer Sister        breast   Breast cancer Sister    Colon polyps Sister    Cancer Maternal Grandmother    Breast cancer Maternal Aunt    Stomach cancer Maternal Aunt    Colon cancer Neg Hx    Esophageal cancer Neg Hx    Rectal cancer Neg Hx     SOCIAL HISTORY Social History   Tobacco Use   Smoking status: Former    Types: Cigarettes    Quit date: 03/04/2005    Years since quitting: 17.0    Smokeless tobacco: Never  Vaping Use   Vaping Use: Never used  Substance Use Topics   Alcohol use: No   Drug use: No         OPHTHALMIC EXAM:  Base Eye Exam     Visual Acuity (ETDRS)       Right Left   Dist [place] 20/40 -1 20/40   Dist ph [place] NI NI         Tonometry (Tonopen, 11:02 AM)       Right Left   Pressure 13 14         Pupils       Pupils APD   Right PERRL None   Left PERRL None         Visual Fields       Left Right    Full Full         Extraocular Movement       Right Left    Full, Ortho Full, Ortho         Neuro/Psych     Oriented x3: Yes         Dilation     Right eye: 2.5% Phenylephrine, 1.0% Mydriacyl @ 11:02 AM           Slit Lamp and Fundus Exam     External Exam       Right Left   External Normal Normal         Slit Lamp Exam       Right Left   Lids/Lashes 1+ Dermatochalasis - upper lid 1+ Dermatochalasis - upper lid   Conjunctiva/Sclera White and quiet White and quiet   Cornea Clear Clear   Anterior Chamber Deep and quiet Deep and quiet   Iris Round and reactive Round and reactive   Lens Centered posterior chamber intraocular lens, pc clear Centered posterior chamber intraocular lens, pc clear   Anterior Vitreous Normal Normal         Fundus Exam       Right Left   Posterior Vitreous Central vitreous floaters, Posterior vitreous detachment Central vitreous floaters, Posterior vitreous detachment   Disc Normal Normal   C/D Ratio 0.3 0.3   Macula Macular thickening, Epiretinal membrane, neg Watzke sign Epiretinal membrane, minor no topo distortion   Vessels Normal Normal   Periphery Normal Normal            IMAGING AND PROCEDURES  Imaging and Procedures for 03/21/22  OCT, Retina - OU - Both Eyes       Right Eye Quality was good. Scan locations included subfoveal. Central Foveal Thickness: 348. Progression has  no prior data.   Left Eye Quality was good. Scan locations included  subfoveal. Central Foveal Thickness: 335. Progression has no prior data. Findings include abnormal foveal contour.   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  By vision loss.  This would require consideration for vitrectomy membrane peel right eye.  Left eye early epiretinal membrane with minor thickening but no tangential traction significant no schisis,  no CME and no early hole formation     Color Fundus Photography Optos - OU - Both Eyes       Right Eye Progression has no prior data. Disc findings include normal observations. Macula : epiretinal membrane. Vessels : normal observations.   Left Eye Progression has no prior data. Macula : epiretinal membrane. Vessels : normal observations. Periphery : normal observations.              ASSESSMENT/PLAN:  Epiretinal membrane, right eye Significant impact on visual acuity OD.  So with floaters in the right eye.  I explained the patient that vitrectomy membrane peel remove the wrinkle which will allow for stabilization and improvement acuity of the maximum that is a possible and has a beneficial side effect of the floaters in that same right eye would be removed.  Epiretinal membrane, left eye OS minor epiretinal membrane will continue to observe  Posterior vitreous detachment of both eyes No peripheral retinal holes or tears yet symptomatic visual floaters more pronounced left eye than right  Lamellar macular hole of right eye Early is tach noted by tangential traction triggering schisis and outer retinal photoreceptor changes but negative Watzke at this time     ICD-10-CM   1. Epiretinal membrane, right eye  H35.371 OCT, Retina - OU - Both Eyes    Color Fundus Photography Optos - OU - Both Eyes    2. Epiretinal membrane, left eye  H35.372 OCT, Retina - OU - Both Eyes    Color Fundus Photography Optos - OU  - Both Eyes    3. Posterior vitreous detachment of both eyes  H43.813     4. 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  OD with symptomatic visual symptoms from epiretinal membrane and early foveal macular schisis triggering early macular hole formation right eye.  2.  OD will recommend vitrectomy membrane peel the right eye a nonurgent fashion so as to maximize visual potential.  3.  We will continue to monitor and observe  Ophthalmic Meds Ordered this visit:  No orders of the defined types were placed in this encounter.      Return , [facility], H35.371, for Schedule vitrectomy membrane peel right eye, [address], OD.  There are no Patient Instructions on file for this visit.   Explained the diagnoses, plan, and follow up with the patient and they expressed understanding.  Patient expressed understanding of the importance of proper follow up care.   Clent Demark Melvinia Ashby M.D. Diseases & Surgery of the Retina and Vitreous [facility] [place] 03/21/22     Abbreviations: M myopia (nearsighted); A astigmatism; H hyperopia (farsighted); P presbyopia; Mrx spectacle prescription;  CTL contact lenses; OD right eye; OS left eye; OU both eyes  XT exotropia; ET esotropia; PEK punctate epithelial keratitis; PEE punctate epithelial erosions; DES dry eye syndrome; MGD meibomian gland dysfunction; ATs artificial tears; PFAT's preservative free artificial tears; [place]  nuclear sclerotic cataract; PSC posterior subcapsular cataract; ERM epi-retinal membrane; PVD posterior vitreous detachment; RD retinal detachment; DM diabetes mellitus; DR diabetic retinopathy; NPDR non-proliferative diabetic retinopathy; PDR proliferative diabetic retinopathy; CSME clinically significant macular edema; DME diabetic macular edema; dbh dot blot hemorrhages; CWS cotton wool spot; POAG primary open angle glaucoma; C/D cup-to-disc ratio; HVF humphrey visual field; GVF goldmann visual field; OCT  optical coherence tomography; IOP intraocular pressure; BRVO Branch retinal vein occlusion; CRVO central retinal vein occlusion; CRAO central retinal artery occlusion; BRAO branch retinal artery occlusion; RT retinal tear; SB scleral buckle; PPV pars plana vitrectomy; VH Vitreous hemorrhage; PRP panretinal laser photocoagulation; IVK intravitreal kenalog; VMT vitreomacular traction; MH Macular hole;  NVD neovascularization of the disc; NVE neovascularization elsewhere; AREDS age related eye disease study; ARMD age related macular degeneration; POAG primary open angle glaucoma; EBMD epithelial/anterior basement membrane dystrophy; ACIOL anterior chamber intraocular lens; IOL intraocular lens; PCIOL posterior chamber intraocular lens; Phaco/IOL phacoemulsification with intraocular lens placement; PRK photorefractive keratectomy; LASIK laser assisted in situ keratomileusis; HTN hypertension; DM diabetes mellitus; COPD chronic obstructive pulmonary disease

## 2022-03-22 ENCOUNTER — Ambulatory Visit (INDEPENDENT_AMBULATORY_CARE_PROVIDER_SITE_OTHER): Payer: Medicare Other

## 2022-03-22 VITALS — Ht 62.0 in | Wt 122.0 lb

## 2022-03-22 DIAGNOSIS — Z0000 Encounter for general adult medical examination without abnormal findings: Secondary | ICD-10-CM

## 2022-03-22 NOTE — Progress Notes (Signed)
Subjective:   Kelly Mcdowell is a 81 y.o. female who presents for Medicare Annual (Subsequent) preventive examination.  Virtual Visit via Telephone Note  I connected with  Kelly Mcdowell on 03/22/22 at  2:30 PM EDT by telephone and verified that I am speaking with the correct person using two identifiers.  Location: Patient: home  Provider: [place]  Persons participating in the virtual visit: patient/Nurse Health Advisor   I discussed the limitations, risks, security and privacy concerns of performing an evaluation and management service by telephone and the availability of in person appointments. The patient expressed understanding and agreed to proceed.  Interactive audio and video telecommunications were attempted between this nurse and patient, however failed, due to patient having technical difficulties OR patient did not have access to video capability.  We continued and completed visit with audio only.  Some vital signs may be absent or patient reported.   Lorrene Reid, LPN  Review of Systems     Cardiac Risk Factors include: advanced age (>71men, >82 women)     Objective:    Today's Vitals   03/22/22 1445  Weight: 122 lb (55.3 kg)  Height: 5\' 2"  (1.575 m)   Body mass index is 22.31 kg/m.     03/22/2022    2:52 PM 12/14/2020    3:55 PM 12/14/2020   11:11 AM 12/09/2020   11:08 AM 06/16/2020    1:25 PM 06/16/2020    1:06 PM 05/15/2019    1:05 PM  Advanced Directives  Does Patient Have a Medical Advance Directive? Yes No No No No Yes;No No  Type of 13/05/2019 of [place];Living will     Healthcare Power of Attorney   Copy of Healthcare Power of Attorney in Chart? No - copy requested        Would patient like information on creating a medical advance directive?  No - Patient declined No - Patient declined No - Patient declined   Yes (Inpatient - patient defers creating a medical advance directive and declines information at this time)     Current Medications (verified) Outpatient Encounter Medications as of 03/22/2022  Medication Sig   cholecalciferol (VITAMIN D3) 25 MCG (1000 UNIT) tablet Take 2,000 Units by mouth daily.   famotidine (PEPCID) 20 MG tablet Take 20 mg by mouth daily as needed for heartburn or indigestion.   fluticasone (FLONASE) 50 MCG/ACT nasal spray Place 1 spray into both nostrils daily.   hydrOXYzine (ATARAX) 25 MG tablet Take 1/2 tablet to one whole tablet by mouth daily as needed for itching   levothyroxine (SYNTHROID) 88 MCG tablet Take 1 tablet (88 mcg total) by mouth daily before breakfast. 3days per week - alternating with 1/2 tablet on other 4 days.   Menthol, Topical Analgesic, (BIOFREEZE ROLL-ON) 4 % GEL Apply 1 application. topically daily as needed (pain).   rivaroxaban (XARELTO) 20 MG TABS tablet Take 1 tablet (20 mg total) by mouth daily with supper.   aspirin EC 81 MG tablet Take 1 tablet (81 mg total) by mouth daily. Take once daily x 6 weeks after surgery IF still only taking one eliquis instead of 2.   No facility-administered encounter medications on file as of 03/22/2022.    Allergies (verified) Patient has no known allergies.   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   Procedure: RIGHT TOTAL HIP ARTHROPLASTY ANTERIOR APPROACH;  Surgeon: Tarry Kos, MD;  Location: [facility] OR;  Service: Orthopedics;  Laterality: Right;  3-C   TUBAL LIGATION     Family History  Problem Relation Age of Onset   Congestive Heart Failure Father    Cancer Sister        breast   Breast cancer Sister    Colon polyps Sister    Cancer Maternal  Grandmother    Breast cancer Maternal Aunt    Stomach cancer Maternal Aunt    Colon cancer Neg Hx    Esophageal cancer Neg Hx    Rectal cancer Neg Hx    Social History   Socioeconomic History   Marital status: Married    Spouse name: Not on file   Number of children: Not on file   Years of education: Not on file   Highest education level: Not on file  Occupational History   Not on file  Tobacco Use   Smoking status: Former    Types: Cigarettes    Quit date: 03/04/2005    Years since quitting: 17.0   Smokeless tobacco: Never  Vaping Use   Vaping Use: Never used  Substance and Sexual Activity   Alcohol use: No   Drug use: No   Sexual activity: Never    Birth control/protection: Surgical  Other Topics Concern   Not on file  Social History Narrative   Married and lives with husband.  Three daughters.  12 grands.     Social Determinants of Health   Financial Resource Strain: Low Risk  (03/22/2022)   Overall Financial Resource Strain (CARDIA)    Difficulty of Paying Living Expenses: Not hard at all  Food Insecurity: No Food Insecurity (03/22/2022)   Hunger Vital Sign    Worried About Running Out of Food in the Last Year: Never true    Ran Out of Food in the Last Year: Never true  Transportation Needs: No Transportation Needs (03/22/2022)   PRAPARE - Administrator, Civil Service (Medical): No    Lack of Transportation (Non-Medical): No  Physical Activity: Insufficiently Active (03/22/2022)   Exercise Vital Sign    Days of Exercise per Week: 3 days    Minutes of Exercise per Session: 30 min  Stress: No Stress Concern Present (03/22/2022)   [employer] of Occupational Health - Occupational Stress Questionnaire    Feeling of Stress : Not at all  Social Connections: Moderately Isolated (03/22/2022)   Social Connection and Isolation Panel [NHANES]    Frequency of Communication with Friends and Family: More than three times a week    Frequency of Social  Gatherings with Friends and Family: More than three times a week    Attends Religious Services: Never    Database administrator or Organizations: No    Attends Engineer, structural: Never    Marital Status: Married    Tobacco Counseling Counseling given: Not Answered   Clinical Intake:  Pre-visit preparation completed: Yes  Pain : No/denies pain     Nutritional Risks: None Diabetes: No  How often do you need to have someone help  you when you read instructions, pamphlets, or other written materials from your doctor or pharmacy?: 1 - Never  Diabetic?no   Interpreter Needed?: No  Information entered by :: Renie OraLaura Wilson, LPN   Activities of Daily Living    03/22/2022    2:49 PM  In your present state of health, do you have any difficulty performing the following activities:  Hearing? 0  Vision? 0  Difficulty concentrating or making decisions? 0  Walking or climbing stairs? 0  Dressing or bathing? 0  Doing errands, shopping? 0  Preparing Food and eating ? N  Using the Toilet? N  In the past six months, have you accidently leaked urine? N  Do you have problems with loss of bowel control? N  Managing your Medications? N  Managing your Finances? N  Housekeeping or managing your Housekeeping? N    Patient Care Team: Shade FloodGreene, Jeffrey R, MD as PCP - General (Family Medicine) Rollene RotundaHochrein, James, MD as PCP - Cardiology (Cardiology) Sallye LatGroat, Christopher, MD as Consulting Physician (Ophthalmology)  Indicate any recent Medical Services you may have received from other than [facility] providers in the past year (date may be approximate).     Assessment:   This is a routine wellness examination for MoreaJeanette.  Hearing/Vision screen Vision Screening - Comments:: Annual eye exams wear glasses   Dietary issues and exercise activities discussed: Current Exercise Habits: Home exercise routine, Type of exercise: walking, Time (Minutes): 30, Frequency (Times/Week): 3, Weekly  Exercise (Minutes/Week): 90, Intensity: Mild, Exercise limited by: None identified;orthopedic condition(s)   Goals Addressed             This Visit's Progress    improve posture   On track    Patient states that she wants to try to improve her posture and be able to stand straight up.        Depression Screen    03/22/2022    2:47 PM 02/21/2022   11:35 AM 10/12/2021    2:12 PM 02/12/2021   10:45 AM 11/12/2020   11:18 AM 09/18/2020   10:46 AM 08/28/2020   10:44 AM  PHQ 2/9 Scores  PHQ - 2 Score 0 0 0 0 0 0 0  PHQ- 9 Score   0        Fall Risk    03/22/2022    2:46 PM 02/21/2022   11:35 AM 10/12/2021    2:12 PM 04/01/2021    1:47 PM 04/01/2021    1:46 PM  Fall Risk   Falls in the past year? 0 0 0 0 0  Number falls in past yr: 0 0 0 0 0  Injury with Fall? 0 0 0 0 0  Risk for fall due to : No Fall Risks No Fall Risks No Fall Risks    Follow up Falls prevention discussed Falls evaluation completed Falls evaluation completed      FALL RISK PREVENTION PERTAINING TO THE HOME:  Any stairs in or around the home? Yes  If so, are there any without handrails? No  Home free of loose throw rugs in walkways, pet beds, electrical cords, etc? Yes  Adequate lighting in your home to reduce risk of falls? Yes   ASSISTIVE DEVICES UTILIZED TO PREVENT FALLS:  Life alert? No  Use of a cane, walker or w/c? No  Grab bars in the bathroom? Yes  Shower chair or bench in shower? Yes  Elevated toilet seat or a handicapped toilet? Yes        03/22/2022  2:50 PM 06/16/2020    1:05 PM 05/15/2019    1:05 PM 05/02/2017   10:50 AM  6CIT Screen  What Year? 0 points 0 points 0 points 0 points  What month? 0 points 0 points 0 points 0 points  What time? 0 points 0 points 0 points 0 points  Count back from 20 0 points 0 points 0 points 0 points  Months in reverse 0 points 0 points 0 points 0 points  Repeat phrase 2 points 0 points 0 points 0 points  Total Score 2 points 0 points 0 points 0  points    Immunizations Immunization History  Administered Date(s) Administered   Pneumococcal Conjugate-13 03/13/2017   Pneumococcal Polysaccharide-23 05/27/2020    TDAP status: Due, Education has been provided regarding the importance of this vaccine. Advised may receive this vaccine at local pharmacy or Health Dept. Aware to provide a copy of the vaccination record if obtained from local pharmacy or Health Dept. Verbalized acceptance and understanding.  Flu Vaccine status: Due, Education has been provided regarding the importance of this vaccine. Advised may receive this vaccine at local pharmacy or Health Dept. Aware to provide a copy of the vaccination record if obtained from local pharmacy or Health Dept. Verbalized acceptance and understanding.  Pneumococcal vaccine status: Up to date  Covid-19 vaccine status: Completed vaccines  Qualifies for Shingles Vaccine? Yes   Zostavax completed No   Shingrix Completed?: No.    Education has been provided regarding the importance of this vaccine. Patient has been advised to call insurance company to determine out of pocket expense if they have not yet received this vaccine. Advised may also receive vaccine at local pharmacy or Health Dept. Verbalized acceptance and understanding.  Screening Tests Health Maintenance  Topic Date Due   Hepatitis C Screening  08/16/2022 (Originally 04/23/1959)   INFLUENZA VACCINE  10/03/2022 (Originally 02/01/2022)   TETANUS/TDAP  02/22/2023 (Originally 04/23/1960)   Pneumonia Vaccine 59+ Years old  Completed   DEXA SCAN  Completed   HPV VACCINES  Aged Out   COLONOSCOPY (Pts 45-27yrs Insurance coverage will need to be confirmed)  Discontinued   COVID-19 Vaccine  Discontinued   Zoster Vaccines- Shingrix  Discontinued    Health Maintenance  There are no preventive care reminders to display for this patient.  Colorectal cancer screening: No longer required.   Mammogram status: No longer required due to  age.  Bone Density status: Completed 04/12/2019. Results reflect: Bone density results: OSTEOPENIA. Repeat every 5 years.  Lung Cancer Screening: (Low Dose CT Chest recommended if Age 52-80 years, 30 pack-year currently smoking OR have quit w/in 15years.) does not qualify.   Lung Cancer Screening Referral: n/a  Additional Screening:  Hepatitis C Screening: does not qualify;   Vision Screening: Recommended annual ophthalmology exams for early detection of glaucoma and other disorders of the eye. Is the patient up to date with their annual eye exam?  Yes  Who is the provider or what is the name of the office in which the patient attends annual eye exams? Dr.Groat  If pt is not established with a provider, would they like to be referred to a provider to establish care? No .   Dental Screening: Recommended annual dental exams for proper oral hygiene  Community Resource Referral / Chronic Care Management: CRR required this visit?  No   CCM required this visit?  No      Plan:     I have personally reviewed and noted the  following in the patient's chart:   Medical and social history Use of alcohol, tobacco or illicit drugs  Current medications and supplements including opioid prescriptions. Patient is not currently taking opioid prescriptions. Functional ability and status Nutritional status Physical activity Advanced directives List of other physicians Hospitalizations, surgeries, and ER visits in previous 12 months Vitals Screenings to include cognitive, depression, and falls Referrals and appointments  In addition, I have reviewed and discussed with patient certain preventive protocols, quality metrics, and best practice recommendations. A written personalized care plan for preventive services as well as general preventive health recommendations were provided to patient.     Daphane Shepherd, LPN   12/14/2447   Nurse Notes: Due TDAP/Flu vaccine

## 2022-03-22 NOTE — Patient Instructions (Signed)
Kelly Mcdowell , Thank you for taking time to come for your Medicare Wellness Visit. I appreciate your ongoing commitment to your health goals. Please review the following plan we discussed and let me know if I can assist you in the future.   Screening recommendations/referrals: Colonoscopy: no longer required  Mammogram: no longer required  Bone Density: 04/22/2019 Recommended yearly ophthalmology/optometry visit for glaucoma screening and checkup Recommended yearly dental visit for hygiene and checkup  Vaccinations: Influenza vaccine: due in the fall  Pneumococcal vaccine: completed  Tdap vaccine: due  Shingles vaccine: will consider    Covid-19:completed   Advanced directives: Please bring a copy of your health care power of attorney and living will to the office to be added to your chart at your convenience.   Conditions/risks identified: Aim for 30 minutes of exercise or brisk walking, 6-8 glasses of water, and 5 servings of fruits and vegetables each day.   Next appointment: Follow up in one year for your annual wellness visit    Preventive Care 65 Years and Older, Female Preventive care refers to lifestyle choices and visits with your health care provider that can promote health and wellness. What does preventive care include? A yearly physical exam. This is also called an annual well check. Dental exams once or twice a year. Routine eye exams. Ask your health care provider how often you should have your eyes checked. Personal lifestyle choices, including: Daily care of your teeth and gums. Regular physical activity. Eating a healthy diet. Avoiding tobacco and drug use. Limiting alcohol use. Practicing safe sex. Taking low-dose aspirin every day. Taking vitamin and mineral supplements as recommended by your health care provider. What happens during an annual well check? The services and screenings done by your health care provider during your annual well check will depend on  your age, overall health, lifestyle risk factors, and family history of disease. Counseling  Your health care provider may ask you questions about your: Alcohol use. Tobacco use. Drug use. Emotional well-being. Home and relationship well-being. Sexual activity. Eating habits. History of falls. Memory and ability to understand (cognition). Work and work Statistician. Reproductive health. Screening  You may have the following tests or measurements: Height, weight, and BMI. Blood pressure. Lipid and cholesterol levels. These may be checked every 5 years, or more frequently if you are over 77 years old. Skin check. Lung cancer screening. You may have this screening every year starting at age 40 if you have a 30-pack-year history of smoking and currently smoke or have quit within the past 15 years. Fecal occult blood test (FOBT) of the stool. You may have this test every year starting at age 46. Flexible sigmoidoscopy or colonoscopy. You may have a sigmoidoscopy every 5 years or a colonoscopy every 10 years starting at age 55. Hepatitis C blood test. Hepatitis B blood test. Sexually transmitted disease (STD) testing. Diabetes screening. This is done by checking your blood sugar (glucose) after you have not eaten for a while (fasting). You may have this done every 1-3 years. Bone density scan. This is done to screen for osteoporosis. You may have this done starting at age 33. Mammogram. This may be done every 1-2 years. Talk to your health care provider about how often you should have regular mammograms. Talk with your health care provider about your test results, treatment options, and if necessary, the need for more tests. Vaccines  Your health care provider may recommend certain vaccines, such as: Influenza vaccine. This is recommended  every year. Tetanus, diphtheria, and acellular pertussis (Tdap, Td) vaccine. You may need a Td booster every 10 years. Zoster vaccine. You may need this  after age 69. Pneumococcal 13-valent conjugate (PCV13) vaccine. One dose is recommended after age 65. Pneumococcal polysaccharide (PPSV23) vaccine. One dose is recommended after age 75. Talk to your health care provider about which screenings and vaccines you need and how often you need them. This information is not intended to replace advice given to you by your health care provider. Make sure you discuss any questions you have with your health care provider. Document Released: 07/17/2015 Document Revised: 03/09/2016 Document Reviewed: 04/21/2015 Elsevier Interactive Patient Education  2017 [employer] Prevention in the Home Falls can cause injuries. They can happen to people of all ages. There are many things you can do to make your home safe and to help prevent falls. What can I do on the outside of my home? Regularly fix the edges of walkways and driveways and fix any cracks. Remove anything that might make you trip as you walk through a door, such as a raised step or threshold. Trim any bushes or trees on the path to your home. Use bright outdoor lighting. Clear any walking paths of anything that might make someone trip, such as rocks or tools. Regularly check to see if handrails are loose or broken. Make sure that both sides of any steps have handrails. Any raised decks and porches should have guardrails on the edges. Have any leaves, snow, or ice cleared regularly. Use sand or salt on walking paths during winter. Clean up any spills in your garage right away. This includes oil or grease spills. What can I do in the bathroom? Use night lights. Install grab bars by the toilet and in the tub and shower. Do not use towel bars as grab bars. Use non-skid mats or decals in the tub or shower. If you need to sit down in the shower, use a plastic, non-slip stool. Keep the floor dry. Clean up any water that spills on the floor as soon as it happens. Remove soap buildup in the tub or  shower regularly. Attach bath mats securely with double-sided non-slip rug tape. Do not have throw rugs and other things on the floor that can make you trip. What can I do in the bedroom? Use night lights. Make sure that you have a light by your bed that is easy to reach. Do not use any sheets or blankets that are too big for your bed. They should not hang down onto the floor. Have a firm chair that has side arms. You can use this for support while you get dressed. Do not have throw rugs and other things on the floor that can make you trip. What can I do in the kitchen? Clean up any spills right away. Avoid walking on wet floors. Keep items that you use a lot in easy-to-reach places. If you need to reach something above you, use a strong step stool that has a grab bar. Keep electrical cords out of the way. Do not use floor polish or wax that makes floors slippery. If you must use wax, use non-skid floor wax. Do not have throw rugs and other things on the floor that can make you trip. What can I do with my stairs? Do not leave any items on the stairs. Make sure that there are handrails on both sides of the stairs and use them. Fix handrails that are broken  or loose. Make sure that handrails are as long as the stairways. Check any carpeting to make sure that it is firmly attached to the stairs. Fix any carpet that is loose or worn. Avoid having throw rugs at the top or bottom of the stairs. If you do have throw rugs, attach them to the floor with carpet tape. Make sure that you have a light switch at the top of the stairs and the bottom of the stairs. If you do not have them, ask someone to add them for you. What else can I do to help prevent falls? Wear shoes that: Do not have high heels. Have rubber bottoms. Are comfortable and fit you well. Are closed at the toe. Do not wear sandals. If you use a stepladder: Make sure that it is fully opened. Do not climb a closed stepladder. Make  sure that both sides of the stepladder are locked into place. Ask someone to hold it for you, if possible. Clearly mark and make sure that you can see: Any grab bars or handrails. First and last steps. Where the edge of each step is. Use tools that help you move around (mobility aids) if they are needed. These include: Canes. Walkers. Scooters. Crutches. Turn on the lights when you go into a dark area. Replace any light bulbs as soon as they burn out. Set up your furniture so you have a clear path. Avoid moving your furniture around. If any of your floors are uneven, fix them. If there are any pets around you, be aware of where they are. Review your medicines with your doctor. Some medicines can make you feel dizzy. This can increase your chance of falling. Ask your doctor what other things that you can do to help prevent falls. This information is not intended to replace advice given to you by your health care provider. Make sure you discuss any questions you have with your health care provider. Document Released: 04/16/2009 Document Revised: 11/26/2015 Document Reviewed: 07/25/2014 Elsevier Interactive Patient Education  2017 [employer].

## 2022-03-31 ENCOUNTER — Ambulatory Visit (INDEPENDENT_AMBULATORY_CARE_PROVIDER_SITE_OTHER): Payer: Medicare Other

## 2022-04-20 ENCOUNTER — Ambulatory Visit (INDEPENDENT_AMBULATORY_CARE_PROVIDER_SITE_OTHER): Payer: Medicare Other

## 2022-04-27 DIAGNOSIS — H33331 Multiple defects of retina without detachment, right eye: Secondary | ICD-10-CM | POA: Diagnosis not present

## 2022-04-27 DIAGNOSIS — H33321 Round hole, right eye: Secondary | ICD-10-CM | POA: Diagnosis not present

## 2022-04-27 DIAGNOSIS — H35371 Puckering of macula, right eye: Secondary | ICD-10-CM | POA: Diagnosis not present

## 2022-04-28 ENCOUNTER — Encounter (INDEPENDENT_AMBULATORY_CARE_PROVIDER_SITE_OTHER): Payer: Self-pay

## 2022-04-28 ENCOUNTER — Encounter (INDEPENDENT_AMBULATORY_CARE_PROVIDER_SITE_OTHER): Payer: Medicare Other | Admitting: Ophthalmology

## 2022-05-05 DIAGNOSIS — H35371 Puckering of macula, right eye: Secondary | ICD-10-CM | POA: Diagnosis not present

## 2022-05-11 DIAGNOSIS — R310 Gross hematuria: Secondary | ICD-10-CM | POA: Diagnosis not present

## 2022-06-16 DIAGNOSIS — H35373 Puckering of macula, bilateral: Secondary | ICD-10-CM | POA: Diagnosis not present

## 2022-07-05 DIAGNOSIS — H35372 Puckering of macula, left eye: Secondary | ICD-10-CM | POA: Diagnosis not present

## 2022-07-13 DIAGNOSIS — H35372 Puckering of macula, left eye: Secondary | ICD-10-CM | POA: Diagnosis not present

## 2022-07-14 DIAGNOSIS — H35372 Puckering of macula, left eye: Secondary | ICD-10-CM | POA: Diagnosis not present

## 2022-07-25 DIAGNOSIS — H35372 Puckering of macula, left eye: Secondary | ICD-10-CM | POA: Diagnosis not present

## 2022-07-25 DIAGNOSIS — H35371 Puckering of macula, right eye: Secondary | ICD-10-CM | POA: Diagnosis not present

## 2022-07-28 ENCOUNTER — Other Ambulatory Visit: Payer: Self-pay | Admitting: Cardiology

## 2022-07-28 DIAGNOSIS — I4891 Unspecified atrial fibrillation: Secondary | ICD-10-CM

## 2022-07-29 NOTE — Telephone Encounter (Signed)
Prescription refill request for Xarelto received.  Indication:afib Last office visit:5/23 Weight:55.3  kg Age:82 Scr:0.8 CrCl:49.78   Under review for Xarelto dose reduction

## 2022-08-01 NOTE — Telephone Encounter (Signed)
Yes please reduce to 15mg once daily 

## 2022-08-24 ENCOUNTER — Encounter: Payer: Self-pay | Admitting: Family Medicine

## 2022-08-24 ENCOUNTER — Ambulatory Visit (INDEPENDENT_AMBULATORY_CARE_PROVIDER_SITE_OTHER): Payer: Medicare Other | Admitting: Family Medicine

## 2022-08-24 DIAGNOSIS — Z8639 Personal history of other endocrine, nutritional and metabolic disease: Secondary | ICD-10-CM

## 2022-08-24 DIAGNOSIS — L299 Pruritus, unspecified: Secondary | ICD-10-CM | POA: Diagnosis not present

## 2022-08-24 DIAGNOSIS — R230 Cyanosis: Secondary | ICD-10-CM | POA: Diagnosis not present

## 2022-08-24 DIAGNOSIS — E039 Hypothyroidism, unspecified: Secondary | ICD-10-CM | POA: Diagnosis not present

## 2022-08-24 LAB — CBC
HCT: 44.1 % (ref 36.0–46.0)
Hemoglobin: 14.7 g/dL (ref 12.0–15.0)
MCHC: 33.4 g/dL (ref 30.0–36.0)
MCV: 91.9 fl (ref 78.0–100.0)
Platelets: 277 10*3/uL (ref 150.0–400.0)
RBC: 4.8 Mil/uL (ref 3.87–5.11)
RDW: 13.1 % (ref 11.5–15.5)
WBC: 5.6 10*3/uL (ref 4.0–10.5)

## 2022-08-24 LAB — TSH: TSH: 3.33 u[IU]/mL (ref 0.35–5.50)

## 2022-08-24 MED ORDER — HYDROXYZINE HCL 25 MG PO TABS: ORAL_TABLET | ORAL | 1 refills | Status: DC

## 2022-08-24 MED ORDER — LEVOTHYROXINE SODIUM 88 MCG PO TABS: 88.0000 ug | ORAL_TABLET | Freq: Every day | ORAL | 2 refills | Status: DC

## 2022-08-24 NOTE — Progress Notes (Signed)
Subjective:  Patient ID: Kelly Mcdowell, female    DOB: [birth year]  Age: 82 y.o. MRN: [id number]  CC:  Chief Complaint  Patient presents with   Poor Circulation    Pt notes fingers are an off color and cold as well as her toes.    Medication Management    HPI Kelly Mcdowell presents for   Circulation concerns: Hands and feet feel cold past few weeks, sometimes look darker or purple - notices past month. Improves with movement or activity. Noted at rest.  On xarelto for afib. Mild aortic insufficiency.  No new bleeding.  No prior vascular eval or ABI.   Hypothyroidism: Lab Results  Component Value Date   TSH 2.57 02/21/2022  Cold more past month. Using heating pad for feet, wooden stove for heat.  No missed doses of synthroid - Taking medication daily. 17mg on 3 days per week, 492m on other 4 days.  cold intolerance. No new hair or skin changes, heart palpitations or new fatigue. No new weight changes.  Prior hypokalemia, 3.4 in June 2022, normal last year.  Hydroxyzine as needed for itching - working ok.  No fever.   Lab Results  Component Value Date   WBC 10.6 (H) 12/15/2020   HGB 12.3 12/15/2020   HCT 36.8 12/15/2020   MCV 90.4 12/15/2020   PLT 215 12/15/2020       History Patient Active Problem List   Diagnosis Date Noted   Epiretinal membrane, right eye 03/21/2022   Epiretinal membrane, left eye 03/21/2022   Posterior vitreous detachment of both eyes 03/21/2022   Lamellar macular hole of right eye 03/21/2022   Status post total replacement of right hip 12/14/2020   Primary osteoarthritis of right hip 09/29/2020   Primary osteoarthritis of left hip 09/29/2020   Educated about COVID-19 virus infection 02/20/2020   [place]rial [place] ([place]/19[place]   Age-related osteoporosis without current pathological fracture 03/04/2019   Hypothyroidism 12/15/2016   Hip joint pain 09/16/2013   Lumbar pain with radiation down left leg 09/16/2013   Hypothyroidism  (acquired) 06/18/2012   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 Authorizing Provider  cholecalciferol (VITAMIN D3) 25 MCG (1000 UNIT) tablet Take 2,000 Units by mouth daily.   Yes [provider]  famotidine (PEPCID) 20 MG tablet Take 20 mg by mouth daily as needed for heartburn or indigestion.   Yes [provider]  fluticasone (FLONASE) 50 MCG/ACT nasal spray Place 1 spray into both nostrils daily. 08/16/21  Yes GrWendie AgresteMD  hydrOXYzine (ATARAX) 25 MG tablet Take 1/2 tablet to one whole tablet by mouth daily as needed for itching 02/21/22  Yes GrWendie AgresteMD  levothyroxine (SYNTHROID) 88 MCG  tablet Take 1 tablet (88 mcg total) by mouth daily before breakfast. 3days per week - alternating with 1/2 tablet on other 4 days. 02/21/22  Yes Wendie Agreste, MD  Menthol, Topical Analgesic, (BIOFREEZE ROLL-ON) 4 % GEL Apply 1 application. topically daily as needed (pain).   Yes [provider]  Rivaroxaban (XARELTO) 15 MG TABS tablet Take 1 tablet (15 mg total) by mouth daily with supper. 08/01/22  Yes Minus Breeding, MD   Social History   Socioeconomic History   Marital status: Married    Spouse name: Not on file   Number of children: Not on file   Years of education: Not on file   Highest education level: Not on file  Occupational  History   Not on file  Tobacco Use   Smoking status: Former    Types: Cigarettes    Quit date: 03/04/2005    Years since quitting: 17.4   Smokeless tobacco: Never  Vaping Use   Vaping Use: Never used  Substance and Sexual Activity   Alcohol use: No   Drug use: No   Sexual activity: Never    Birth control/protection: Surgical  Other Topics Concern   Not on file  Social History Narrative   Married and lives with husband.  Three daughters.  12 grands.     Social Determinants of Health   Financial Resource Strain: Low Risk  (03/22/2022)   Overall Financial Resource Strain (CARDIA)    Difficulty of Paying Living Expenses: Not hard at all  Food Insecurity: No Food Insecurity (03/22/2022)   Hunger Vital Sign    Worried About Running Out of Food in the Last Year: Never true    Ran Out of Food in the Last Year: Never true  Transportation Needs: No Transportation Needs (03/22/2022)   PRAPARE - Hydrologist (Medical): No    Lack of Transportation (Non-Medical): No  Physical Activity: Insufficiently Active (03/22/2022)   Exercise Vital Sign    Days of Exercise per Week: 3 days    Minutes of Exercise per Session: 30 min  Stress: No Stress Concern Present (03/22/2022)   [place]    Feeling of Stress : Not at all  Social Connections: Moderately Isolated (03/22/2022)   Social Connection and Isolation Panel [NHANES]    Frequency of Communication with Friends and Family: More than three times a week    Frequency of Social Gatherings with Friends and Family: More than three times a week    Attends Religious Services: Never    Marine scientist or Organizations: No    Attends Archivist Meetings: Never    Marital Status: Married  Human resources officer Violence: Not At Risk (03/22/2022)   Humiliation, Afraid, Rape, and Kick questionnaire    Fear of Current or Ex-Partner: No    Emotionally  Abused: No    Physically Abused: No    Sexually Abused: No    Review of Systems  Constitutional:  Negative for fatigue and unexpected weight change.  Respiratory:  Negative for chest tightness and shortness of breath.   Cardiovascular:  Negative for chest pain, palpitations and leg swelling.  Gastrointestinal:  Negative for abdominal pain and blood in stool.  Neurological:  Negative for dizziness, syncope, light-headedness and headaches.     Objective:  There were no vitals filed for this visit.   Physical Exam Vitals reviewed.  Constitutional:      Appearance: Normal appearance.  She is well-developed.  HENT:     Head: Normocephalic and atraumatic.  Eyes:     Conjunctiva/sclera: Conjunctivae normal.     Pupils: Pupils are equal, round, and reactive to light.  Neck:     Vascular: No carotid bruit.  Cardiovascular:     Rate and Rhythm: Normal rate and regular rhythm.     Heart sounds: Normal heart sounds.  Pulmonary:     Effort: Pulmonary effort is normal.     Breath sounds: Normal breath sounds.  Abdominal:     Palpations: Abdomen is soft. There is no pulsatile mass.     Tenderness: There is no abdominal tenderness.  Musculoskeletal:     Right lower leg: No edema.     Left lower leg: No edema.  Skin:    General: Skin is warm and dry.     Comments: No cyanosis appreciated.  Cap refill 1 second fingers, 1 to 2 seconds at toes, cool but not cold.  No skin color changes.  Difficulty palpating DP pulse on bilateral feet.  No edema.  Skin intact.  Neurological:     Mental Status: She is alert and oriented to person, place, and time.  Psychiatric:        Mood and Affect: Mood normal.        Behavior: Behavior normal.       Assessment & Plan:  Kelly Mcdowell is a 82 y.o. female . Hypothyroidism (acquired) Hypothyroidism, unspecified type - Plan: levothyroxine (SYNTHROID) 88 MCG tablet, TSH  -Continue same regimen, check TSH.  Cold natured as above, unlikely related  to thyroid.  Close, layers of clothing is needed to stay warm during colder weather, but will also refer to vascular for eval.  Itching - Plan: hydrOXYzine (ATARAX) 25 MG tablet  -Intermittent, controlled with hydroxyzine without new side effects.  History of hypokalemia - Plan: Basic metabolic panel  Skin cyanosis - Plan: CBC, Ambulatory referral to Vascular Surgery  -Intermittent symptoms, no critical limb ischemia appreciated at this time, ER precautions given.  Keep extremities warm, refer to vascular for possible ABI or other testing.  Meds ordered this encounter  Medications   levothyroxine (SYNTHROID) 88 MCG tablet    Sig: Take 1 tablet (88 mcg total) by mouth daily before breakfast. 3days per week - alternating with 1/2 tablet on other 4 days.    Dispense:  90 tablet    Refill:  2   hydrOXYzine (ATARAX) 25 MG tablet    Sig: Take 1/2 tablet to one whole tablet by mouth daily as needed for itching    Dispense:  30 tablet    Refill:  1   Patient Instructions  Patient returns today.  I will check some labs and let she know if there are concerns but have also referred you to vascular specialist evaluate the occasional changes of colors in the fingers and toes.  Try to keep those areas warm as that can sometimes be helpful.  If any color change that does not improve with activity, be seen right away as we discussed.  Let me know if there are questions and take care!    Signed,   Merri Ray, MD [place] 08/24/22 1:33 PM

## 2022-08-24 NOTE — Patient Instructions (Signed)
Patient returns today.  I will check some labs and let she know if there are concerns but have also referred you to vascular specialist evaluate the occasional changes of colors in the fingers and toes.  Try to keep those areas warm as that can sometimes be helpful.  If any color change that does not improve with activity, be seen right away as we discussed.  Let me know if there are questions and take care!

## 2022-08-25 DIAGNOSIS — H353132 Nonexudative age-related macular degeneration, bilateral, intermediate dry stage: Secondary | ICD-10-CM | POA: Diagnosis not present

## 2022-08-25 DIAGNOSIS — H35373 Puckering of macula, bilateral: Secondary | ICD-10-CM | POA: Diagnosis not present

## 2022-08-25 LAB — BASIC METABOLIC PANEL
BUN: 19 mg/dL (ref 6–23)
CO2: 25 mEq/L (ref 19–32)
Calcium: 9.6 mg/dL (ref 8.4–10.5)
Chloride: 107 mEq/L (ref 96–112)
Creatinine, Ser: 0.92 mg/dL (ref 0.40–1.20)
GFR: 59.29 mL/min — ABNORMAL LOW (ref >60.00)
Glucose, Bld: 87 mg/dL (ref 70–99)
Potassium: 4.1 mEq/L (ref 3.5–5.1)
Sodium: 145 mEq/L (ref 135–145)

## 2022-08-28 NOTE — Progress Notes (Unsigned)
[facility] [place]  ASSESSMENT / PLAN: 82 y.o. 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  Patient has a reassuring clinical exam and clinical history.  Counseled her about the benign nature of these findings.  We will perform noninvasive testing to confirm our suspicions today.  I will call her with the results.  CHIEF COMPLAINT: Bluish discoloration of fingers and toes  HISTORY OF PRESENT ILLNESS: Kelly Mcdowell is a 82 y.o. female clinic for evaluation of bluish discoloration of fingers and toes bilaterally.  The patient reports this will occur periodically.  This first began about a month or 2 ago.  The bluish discoloration is not associated with significant discomfort.  It does not limit her activities of daily living or independent activities of daily living.  She does not report any kind of inciting factor or relieving factor.  She does reports that she has a hard time staying warm.  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   Thyroid disease    UTI (urinary tract infection)     Past Surgical History:  Procedure Laterality Date   CESAREAN SECTION     COLONOSCOPY     TOTAL HIP ARTHROPLASTY Right 12/14/2020   Procedure: RIGHT TOTAL HIP ARTHROPLASTY ANTERIOR APPROACH;  Surgeon: Leandrew Koyanagi, MD;  Location: [facility];  Service: Orthopedics;  Laterality: Right;  3-C   TUBAL LIGATION      Family History  Problem Relation Age of Onset   Congestive Heart Failure Father    Cancer Sister        breast   Breast cancer Sister    Colon polyps Sister    Cancer Maternal Grandmother    Breast cancer Maternal Aunt    Stomach cancer Maternal Aunt    Colon cancer Neg Hx    Esophageal cancer Neg Hx    Rectal cancer Neg Hx     Social History   Socioeconomic History    Marital status: Married    Spouse name: Not on file   Number of children: Not on file   Years of education: Not on file   Highest education level: Not on file  Occupational History   Not on file  Tobacco Use   Smoking status: Former    Types: Cigarettes    Quit date: 03/04/2005    Years since quitting: 17.4   Smokeless tobacco: Never  Vaping Use   Vaping Use: Never used  Substance and Sexual Activity   Alcohol use: No   Drug use: No   Sexual activity: Never    Birth control/protection: Surgical  Other Topics Concern   Not on file  Social History Narrative   Married and lives with husband.  Three daughters.  12 grands.     Social Determinants of Health   Financial Resource Strain: Low Risk  (03/22/2022)   Overall Financial Resource Strain (CARDIA)    Difficulty of Paying Living Expenses: Not hard at all  Food Insecurity: No Food Insecurity (03/22/2022)   Hunger Vital Sign    Worried About Running Out of Food in the Last Year: Never true    Ran Out of Food in the Last Year: Never true  Transportation Needs: No Transportation Needs (03/22/2022)  PRAPARE - Hydrologist (Medical): No    Lack of Transportation (Non-Medical): No  Physical Activity: Insufficiently Active (03/22/2022)   Exercise Vital Sign    Days of Exercise per Week: 3 days    Minutes of Exercise per Session: 30 min  Stress: No Stress Concern Present (03/22/2022)   [place]    Feeling of Stress : Not at all  Social Connections: Moderately Isolated (03/22/2022)   Social Connection and Isolation Panel [NHANES]    Frequency of Communication with Friends and Family: More than three times a week    Frequency of Social Gatherings with Friends and Family: More than three times a week    Attends Religious Services: Never    Marine scientist or Organizations: No    Attends Archivist Meetings: Never    Marital  Status: Married  Human resources officer Violence: Not At Risk (03/22/2022)   Humiliation, Afraid, Rape, and Kick questionnaire    Fear of Current or Ex-Partner: No    Emotionally Abused: No    Physically Abused: No    Sexually Abused: No    No Known Allergies  Current Outpatient Medications  Medication Sig Dispense Refill   cholecalciferol (VITAMIN D3) 25 MCG (1000 UNIT) tablet Take 2,000 Units by mouth daily.     famotidine (PEPCID) 20 MG tablet Take 20 mg by mouth daily as needed for heartburn or indigestion.     fluticasone (FLONASE) 50 MCG/ACT nasal spray Place 1 spray into both nostrils daily. 16 g 6   hydrOXYzine (ATARAX) 25 MG tablet Take 1/2 tablet to one whole tablet by mouth daily as needed for itching 30 tablet 1   levothyroxine (SYNTHROID) 88 MCG tablet Take 1 tablet (88 mcg total) by mouth daily before breakfast. 3days per week - alternating with 1/2 tablet on other 4 days. 90 tablet 2   Menthol, Topical Analgesic, (BIOFREEZE ROLL-ON) 4 % GEL Apply 1 application. topically daily as needed (pain).     Rivaroxaban (XARELTO) 15 MG TABS tablet Take 1 tablet (15 mg total) by mouth daily with supper. 30 tablet 5   No current facility-administered medications for this visit.    PHYSICAL EXAM There were no vitals filed for this visit.  Elderly woman no acute distress. Regular rate and rhythm Unlabored breathing Marked kyphosis 2+ radial pulses bilaterally 2+ dorsalis pedis pulses bilaterally Brisk capillary refill in all extremities   PERTINENT LABORATORY AND RADIOLOGIC DATA  Most recent CBC    Latest Ref Rng & Units 08/24/2022    1:41 PM 12/15/2020    6:34 AM 12/09/2020   11:30 AM  CBC  WBC 4.0 - 10.5 K/uL 5.6  10.6  6.3   Hemoglobin 12.0 - 15.0 g/dL 14.7  12.3  14.7   Hematocrit 36.0 - 46.0 % 44.1  36.8  46.5   Platelets 150.0 - 400.0 K/uL 277.0  215  265      Most recent CMP    Latest Ref Rng & Units 08/24/2022    1:41 PM 02/21/2022   12:17 PM 12/15/2020    6:34 AM   CMP  Glucose 70 - 99 mg/dL 87  80  154   BUN 6 - 23 mg/dL '19  14  14   '$ Creatinine 0.40 - 1.20 mg/dL 0.92  0.82  0.71   Sodium 135 - 145 mEq/L 145  140  135   Potassium 3.5 - 5.1 mEq/L 4.1  4.0  3.4   Chloride 96 - 112 mEq/L 107  103  100   CO2 19 - 32 mEq/L '25  24  26   '$ Calcium 8.4 - 10.5 mg/dL 9.6  9.1  8.6     Renal function CrCl cannot be calculated (Unknown ideal weight.).  No results found for: "HGBA1C"  LDL Calculated  Date Value Ref Range Status  05/09/2017 188 (H) 0 - 99 mg/dL Final    Yevonne Aline. Stanford Breed, MD [facility] Phone Number: [phone] 08/28/2022 8:32 PM   Total time spent on preparing this encounter including chart review, data review, collecting history, examining the patient, coordinating care for this new patient, 45 minutes.  Portions of this report may have been transcribed using voice recognition software.  Every effort has been made to ensure accuracy; however, inadvertent computerized transcription errors may still be present.

## 2022-08-29 ENCOUNTER — Ambulatory Visit (INDEPENDENT_AMBULATORY_CARE_PROVIDER_SITE_OTHER): Payer: Medicare Other | Admitting: Vascular Surgery

## 2022-08-29 ENCOUNTER — Encounter: Payer: Self-pay | Admitting: Vascular Surgery

## 2022-08-29 VITALS — BP 130/68 | HR 61 | Temp 98.0°F | Resp 14 | Ht 62.0 in | Wt 125.0 lb

## 2022-08-29 DIAGNOSIS — I7300 Raynaud's syndrome without gangrene: Secondary | ICD-10-CM | POA: Diagnosis not present

## 2022-09-01 ENCOUNTER — Other Ambulatory Visit: Payer: Self-pay

## 2022-09-01 DIAGNOSIS — I7300 Raynaud's syndrome without gangrene: Secondary | ICD-10-CM

## 2022-09-13 ENCOUNTER — Ambulatory Visit (HOSPITAL_COMMUNITY)
Admission: RE | Admit: 2022-09-13 | Discharge: 2022-09-13 | Disposition: A | Discharge status: Home / Self Care | Payer: Medicare Other | Source: Ambulatory Visit | Attending: Vascular Surgery | Admitting: Vascular Surgery

## 2022-09-13 ENCOUNTER — Ambulatory Visit (INDEPENDENT_AMBULATORY_CARE_PROVIDER_SITE_OTHER)
Admission: RE | Admit: 2022-09-13 | Discharge: 2022-09-13 | Disposition: A | Discharge status: Home / Self Care | Payer: Medicare Other | Source: Ambulatory Visit | Attending: Vascular Surgery | Admitting: Vascular Surgery

## 2022-09-13 DIAGNOSIS — I7300 Raynaud's syndrome without gangrene: Secondary | ICD-10-CM

## 2022-09-14 ENCOUNTER — Ambulatory Visit (INDEPENDENT_AMBULATORY_CARE_PROVIDER_SITE_OTHER): Payer: Medicare Other | Admitting: Vascular Surgery

## 2022-09-14 DIAGNOSIS — I7300 Raynaud's syndrome without gangrene: Secondary | ICD-10-CM

## 2022-09-14 LAB — VAS US ABI WITH/WO TBI
Left ABI: 1.11
Right ABI: 1.03

## 2022-09-14 NOTE — Progress Notes (Signed)
Reviewed non-invasive studies with patient via telephone. Reviewed natural history of Raynaud's. Follow up with me as needed.  Kelly Mcdowell. Stanford Breed, MD [facility] Phone Number: [phone] 09/14/2022 10:32 AM

## 2022-10-24 DIAGNOSIS — H353132 Nonexudative age-related macular degeneration, bilateral, intermediate dry stage: Secondary | ICD-10-CM | POA: Diagnosis not present

## 2022-10-24 DIAGNOSIS — H35373 Puckering of macula, bilateral: Secondary | ICD-10-CM | POA: Diagnosis not present

## 2023-02-01 ENCOUNTER — Encounter (INDEPENDENT_AMBULATORY_CARE_PROVIDER_SITE_OTHER): Payer: Self-pay

## 2023-02-12 ENCOUNTER — Other Ambulatory Visit: Payer: Self-pay | Admitting: Cardiology

## 2023-02-12 DIAGNOSIS — I4891 Unspecified atrial fibrillation: Secondary | ICD-10-CM

## 2023-02-13 NOTE — Telephone Encounter (Signed)
Prescription refill request for Xarelto received.  Indication:afib Last office visit:upcoming Weight:56.7  kg Age:82 Scr:0.92  2/24 CrCl:44.38  ml/min  Prescription refilled

## 2023-02-16 ENCOUNTER — Other Ambulatory Visit: Payer: Self-pay | Admitting: Cardiology

## 2023-02-16 DIAGNOSIS — I4891 Unspecified atrial fibrillation: Secondary | ICD-10-CM

## 2023-02-16 NOTE — Telephone Encounter (Signed)
Pt last saw Dr Antoine Poche 12/01/21, pt is overdue for follow-up.  Pt has upcoming appt scheduled for 03/08/23 with Dr Antoine Poche.  Last labs 08/24/22 Creat 0.92, age 82, weight 56.7kg, CrCl 44.38, based on CrCl pt is on appropriate dosage of Xarelto 15mg  every day for afib.  Will refill rx to get pt to upcoming appt with Dr Antoine Poche.

## 2023-02-17 ENCOUNTER — Other Ambulatory Visit: Payer: Self-pay | Admitting: Cardiology

## 2023-02-17 DIAGNOSIS — I4891 Unspecified atrial fibrillation: Secondary | ICD-10-CM

## 2023-02-18 ENCOUNTER — Other Ambulatory Visit: Payer: Self-pay | Admitting: Cardiology

## 2023-02-18 DIAGNOSIS — I4891 Unspecified atrial fibrillation: Secondary | ICD-10-CM

## 2023-02-20 ENCOUNTER — Other Ambulatory Visit: Payer: Self-pay

## 2023-02-20 ENCOUNTER — Telehealth: Payer: Self-pay | Admitting: Cardiology

## 2023-02-20 DIAGNOSIS — I4891 Unspecified atrial fibrillation: Secondary | ICD-10-CM

## 2023-02-20 MED ORDER — RIVAROXABAN 15 MG PO TABS: 15.0000 mg | ORAL_TABLET | Freq: Every day | ORAL | 5 refills | Status: DC

## 2023-02-20 NOTE — Telephone Encounter (Signed)
Patient states pharmacy did not receive Rx for xarelto.  Spoke with pharmacy and xarelto has been resent.  Called and left voicemail for patient to return call to office

## 2023-02-20 NOTE — Telephone Encounter (Signed)
Pt c/o medication issue:  1. Name of Medication: Rivaroxaban (XARELTO) 15 MG TABS tablet   2. How are you currently taking this medication (dosage and times per day)?   3. Are you having a reaction (difficulty breathing--STAT)?   4. What is your medication issue? Patient states that they are needing prior auth for this medication. Only has 1 day left of this medication.

## 2023-02-22 ENCOUNTER — Ambulatory Visit: Payer: Medicare Other | Admitting: Family Medicine

## 2023-02-22 ENCOUNTER — Encounter: Payer: Self-pay | Admitting: Family Medicine

## 2023-02-22 VITALS — BP 128/70 | HR 61 | Temp 97.9°F | Ht 62.0 in | Wt 123.0 lb

## 2023-02-22 DIAGNOSIS — R221 Localized swelling, mass and lump, neck: Secondary | ICD-10-CM

## 2023-02-22 DIAGNOSIS — E039 Hypothyroidism, unspecified: Secondary | ICD-10-CM

## 2023-02-22 DIAGNOSIS — I4891 Unspecified atrial fibrillation: Secondary | ICD-10-CM

## 2023-02-22 DIAGNOSIS — L299 Pruritus, unspecified: Secondary | ICD-10-CM | POA: Diagnosis not present

## 2023-02-22 LAB — COMPREHENSIVE METABOLIC PANEL
ALT: 10 U/L (ref 0–35)
AST: 17 U/L (ref 0–37)
Albumin: 3.9 g/dL (ref 3.5–5.2)
Alkaline Phosphatase: 88 U/L (ref 39–117)
BUN: 14 mg/dL (ref 6–23)
CO2: 23 mEq/L (ref 19–32)
Calcium: 8.9 mg/dL (ref 8.4–10.5)
Chloride: 111 meq/L (ref 96–112)
Creatinine, Ser: 0.72 mg/dL (ref 0.40–1.20)
GFR: 79.29 mL/min (ref >60.00)
Glucose, Bld: 111 mg/dL — ABNORMAL HIGH (ref 70–99)
Potassium: 3.8 meq/L (ref 3.5–5.1)
Sodium: 144 meq/L (ref 135–145)
Total Bilirubin: 0.5 mg/dL (ref 0.2–1.2)
Total Protein: 6.3 g/dL (ref 6.0–8.3)

## 2023-02-22 LAB — CBC
HCT: 41.6 % (ref 36.0–46.0)
Hemoglobin: 13.7 g/dL (ref 12.0–15.0)
MCHC: 33 g/dL (ref 30.0–36.0)
MCV: 92 fl (ref 78.0–100.0)
Platelets: 269 10*3/uL (ref 150.0–400.0)
RBC: 4.53 Mil/uL (ref 3.87–5.11)
RDW: 13.5 % (ref 11.5–15.5)
WBC: 6.4 10*3/uL (ref 4.0–10.5)

## 2023-02-22 MED ORDER — LEVOTHYROXINE SODIUM 88 MCG PO TABS: 88.0000 ug | ORAL_TABLET | Freq: Every day | ORAL | 2 refills | Status: DC

## 2023-02-22 MED ORDER — HYDROXYZINE HCL 25 MG PO TABS: ORAL_TABLET | ORAL | 1 refills | Status: DC

## 2023-02-22 NOTE — Progress Notes (Signed)
Subjective:  Patient ID: Kelly Mcdowell, female    DOB: Sep 16, 1940  Age: 82 y.o. MRN: [id number]  CC:  Chief Complaint  Patient presents with   Medical Management of Chronic Issues    Pt is doing okay, no concerns     HPI MARYELLE HADSELL presents for   Hypothyroidism: Lab Results  Component Value Date   TSH 3.33 08/24/2022  treated with Synthroid 88 mcg 3d per week, 44 mcg on other days.  Taking medication daily. No new side effects.  No new hot or cold intolerance. No new hair or skin changes, heart palpitations or new fatigue. No new weight changes.   Pruritus: Only intermittent hydroxyzine need.   Atrial fibrillation: No recent palpitations. Paroxysmal. On Xarelto 15mg  every day. No new bleeding. No dark stools. Remote hx of hypokalemia, no current supplement.  Enlarged area on left carotid/neck noted - past few months. No pain. Appt with cardiology in September.  Lab Results  Component Value Date   WBC 5.6 08/24/2022   HGB 14.7 08/24/2022   HCT 44.1 08/24/2022   MCV 91.9 08/24/2022   PLT 277.0 08/24/2022   Lab Results  Component Value Date   NA 145 08/24/2022   K 4.1 08/24/2022   CL 107 08/24/2022   CO2 25 08/24/2022       History Patient Active Problem List   Diagnosis Date Noted   Epiretinal membrane, right eye 03/21/2022   Epiretinal membrane, left eye 03/21/2022   Posterior vitreous detachment of both eyes 03/21/2022   Lamellar macular hole of right eye 03/21/2022   Status post total replacement of right hip 12/14/2020   Primary osteoarthritis of right hip 09/29/2020   Primary osteoarthritis of left hip 09/29/2020   Educated about COVID-19 virus infection 02/20/2020   Atrial fibrillation (HCC) 02/20/2020   Age-related osteoporosis without current pathological fracture 03/04/2019   Hypothyroidism 12/15/2016   Hip joint pain 09/16/2013   Lumbar pain with radiation down left leg 09/16/2013   Hypothyroidism (acquired) 06/18/2012   Past Medical  History:  Diagnosis Date   Allergy    Arthritis    Cataract    both eyes   Dysrhythmia    afib   Hypothyroidism    Leaky heart valve    Osteoporosis    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 Authorizing Provider  cholecalciferol (VITAMIN D3) 25 MCG (1000 UNIT) tablet Take 2,000 Units by mouth daily.   Yes [provider]  famotidine (PEPCID) 20 MG tablet Take 20 mg by mouth daily as needed for heartburn or indigestion.   Yes [provider]  hydrOXYzine (ATARAX) 25 MG tablet Take 1/2 tablet to one whole tablet by mouth daily as needed for itching 08/24/22  Yes [name], MD  levothyroxine (SYNTHROID) 88 MCG tablet Take 1 tablet (88 mcg total) by mouth daily before breakfast. 3days per week - alternating with 1/2 tablet on other 4 days. 08/24/22  Yes [name], MD  Menthol, Topical Analgesic, (BIOFREEZE ROLL-ON) 4 % GEL Apply 1 application. topically daily as needed (pain).   Yes [provider]  Multiple Vitamins-Minerals (PRESERVISION AREDS 2 PO) Take by mouth.   Yes [provider]  Rivaroxaban (XARELTO) 15 MG TABS tablet Take 1 tablet (15 mg total) by mouth daily with supper. 02/20/23  Yes Rollene Rotunda, MD   Social History   Socioeconomic History   Marital status: Married    Spouse name: Not on file   Number of children: Not on file   Years of education: Not on file   Highest education level: 12th grade  Occupational History   Not on file  Tobacco Use   Smoking status: Former     Current packs/day: 0.00    Types: Cigarettes    Quit date: 03/04/2005    Years since quitting: 17.9   Smokeless tobacco: Never  Vaping Use   Vaping status: Never Used  Substance and Sexual Activity   Alcohol use: No   Drug use: No   Sexual activity: Never    Birth control/protection: Surgical  Other Topics Concern   Not on file  Social History Narrative   Married and lives with husband.  Three daughters.  12 grands.     Social Determinants of Health   Financial Resource Strain: Low Risk  (02/21/2023)   Overall Financial Resource Strain (CARDIA)    Difficulty of Paying Living Expenses: Not hard at all  Food Insecurity: No Food Insecurity (02/21/2023)   Hunger Vital Sign    Worried About Running Out of Food in the Last Year: Never true    Ran Out of Food in the Last Year: Never true  Transportation Needs: No Transportation Needs (02/21/2023)   PRAPARE - Administrator, Civil Service (Medical): No    Lack of Transportation (Non-Medical): No  Physical Activity: Unknown (02/21/2023)   Exercise Vital Sign    Days of Exercise per Week: Patient declined    Minutes of Exercise per Session: 30 min  Stress: No Stress Concern Present (02/21/2023)   [employer] of Occupational Health - Occupational Stress Questionnaire    Feeling of Stress : Only a little  Social Connections: Unknown (02/21/2023)   Social Connection and Isolation Panel [NHANES]    Frequency of Communication with Friends and Family: More than three times a week    Frequency of Social Gatherings with Friends and Family: Patient declined    Attends Religious Services: Patient declined    Database administrator or Organizations: No    Attends Banker Meetings: Never    Marital Status: Married  Catering manager Violence: Not At Risk (03/22/2022)   Humiliation, Afraid, Rape, and Kick questionnaire    Fear of Current or Ex-Partner: No    Emotionally Abused: No    Physically Abused: No    Sexually  Abused: No    Review of Systems Per HPI.  Objective:   Vitals:   02/22/23 0944  BP: 128/70  Pulse: 61  Temp: 97.9 F (36.6 C)  TempSrc: Temporal  SpO2: 98%  Weight: 123 lb (55.8 kg)  Height: 5\' 2"  (1.575 m)     Physical Exam Vitals reviewed.  Constitutional:      Appearance: Normal appearance. She is well-developed.  HENT:     Head: Normocephalic and atraumatic.  Eyes:     Conjunctiva/sclera: Conjunctivae normal.     Pupils: Pupils are equal, round, and reactive to light.  Neck:  Vascular: No carotid bruit.     Comments: No carotid bruit but prominent left carotid, possible swelling in mid aspect, nontender.  Cardiovascular:     Rate and Rhythm: Normal rate and regular rhythm.     Heart sounds: Normal heart sounds.  Pulmonary:     Effort: Pulmonary effort is normal.     Breath sounds: Normal breath sounds.  Abdominal:     Palpations: Abdomen is soft. There is no pulsatile mass.     Tenderness: There is no abdominal tenderness.  Musculoskeletal:     Right lower leg: No edema.     Left lower leg: No edema.  Skin:    General: Skin is warm and dry.  Neurological:     Mental Status: She is alert and oriented to person, place, and time.  Psychiatric:        Mood and Affect: Mood normal.        Behavior: Behavior normal.        Assessment & Plan:  APARNA AISPURO is a 82 y.o. female . Atrial fibrillation, unspecified type (HCC) - Plan: CBC  -Tolerating anticoagulation, check CBC, no med changes.  Hypothyroidism, unspecified type - Plan: levothyroxine (SYNTHROID) 88 MCG tablet, TSH, Comprehensive metabolic panel  - Stable, tolerating current regimen with alternating doses.. Medications refilled. Labs pending as above.   Itching - Plan: hydrOXYzine (ATARAX) 25 MG tablet  -Stable with as needed hydroxyzine, continue same, potential side effects have been discussed.  Localized swelling, mass and lump, neck - Plan: US Carotid Bilateral  -Appears to be a  left carotid, check ultrasound initially, then other studies to be determined by initial results.  Follow-up with cardiology as planned.  Meds ordered this encounter  Medications   levothyroxine (SYNTHROID) 88 MCG tablet    Sig: Take 1 tablet (88 mcg total) by mouth daily before breakfast. 3days per week - alternating with 1/2 tablet on other 4 days.    Dispense:  90 tablet    Refill:  2   hydrOXYzine (ATARAX) 25 MG tablet    Sig: Take 1/2 tablet to one whole tablet by mouth daily as needed for itching    Dispense:  30 tablet    Refill:  1   Patient Instructions  If any concerns on labs I will let you know, no medication changes at this time.  Keep follow-up with your other specialist as planned.  I will order an ultrasound to evaluate the carotid artery on the left as that seems to be prominent and in the area of the swelling. Please let me know if you have questions.     Signed,   Meredith Staggers, MD [facility], [facility] 02/22/23 10:14 AM

## 2023-02-22 NOTE — Patient Instructions (Signed)
If any concerns on labs I will let you know, no medication changes at this time.  Keep follow-up with your other specialist as planned.  I will order an ultrasound to evaluate the carotid artery on the left as that seems to be prominent and in the area of the swelling. Please let me know if you have questions.

## 2023-02-24 LAB — TSH: TSH: 4.87 u[IU]/mL (ref 0.35–5.50)

## 2023-02-27 ENCOUNTER — Other Ambulatory Visit: Payer: Self-pay | Admitting: Family Medicine

## 2023-02-27 DIAGNOSIS — L299 Pruritus, unspecified: Secondary | ICD-10-CM

## 2023-03-02 ENCOUNTER — Ambulatory Visit
Admission: RE | Admit: 2023-03-02 | Discharge: 2023-03-02 | Disposition: A | Discharge status: Home / Self Care | Payer: Medicare Other | Source: Ambulatory Visit | Attending: Family Medicine | Admitting: Family Medicine

## 2023-03-02 DIAGNOSIS — R221 Localized swelling, mass and lump, neck: Secondary | ICD-10-CM

## 2023-03-02 DIAGNOSIS — I6523 Occlusion and stenosis of bilateral carotid arteries: Secondary | ICD-10-CM | POA: Diagnosis not present

## 2023-03-03 ENCOUNTER — Other Ambulatory Visit: Payer: Self-pay | Admitting: Family Medicine

## 2023-03-03 ENCOUNTER — Other Ambulatory Visit: Payer: Self-pay

## 2023-03-03 DIAGNOSIS — L299 Pruritus, unspecified: Secondary | ICD-10-CM

## 2023-03-03 DIAGNOSIS — R221 Localized swelling, mass and lump, neck: Secondary | ICD-10-CM

## 2023-03-03 MED ORDER — HYDROXYZINE HCL 25 MG PO TABS: ORAL_TABLET | ORAL | 1 refills | Status: DC

## 2023-03-03 NOTE — Progress Notes (Signed)
US ordered

## 2023-03-05 DIAGNOSIS — I061 Rheumatic aortic insufficiency: Secondary | ICD-10-CM | POA: Insufficient documentation

## 2023-03-05 NOTE — Progress Notes (Unsigned)
Cardiology Office Note:   Date:  03/08/2023  ID:  Kelly Mcdowell, DOB May 01, 1941, MRN [id number] PCP: [name], MD  [facility] Providers Cardiologist:  Rollene Rotunda, MD {  History of Present Illness:   Kelly Mcdowell is a 82 y.o. female who presents for evaluation of atrial fibrillation.  She does not feel palpitations.  Every once in a while she will see her heart rate go very briefly up to 120 but it comes right back down.  She does not feel lightheaded.  She does not feel like she is going to pass out.  She would not know that today she is actually in atrial fibrillation.  She does her chores.  She even weed eats a little bit.  She grocery shops and does vacuum cleaning and denies any cardiovascular symptoms.  Of note we had reduced her Xarelto apparently to 15 mg because she hit her hand and had a big hematoma and also she had some hematuria.  All of that has cleared up.  She has normal renal function.  She has had no bleeding issues otherwise.  ROS: As stated in the HPI and negative for all other systems.  Severe kyphoscoliosis  Studies Reviewed:    EKG:   EKG Interpretation Date/Time:  Wednesday March 08 2023 10:32:49 EDT Ventricular Rate:  98 PR Interval:    QRS Duration:  80 QT Interval:  356 QTC Calculation: 454 R Axis:   -18  Text Interpretation: Atrial fibrillation Septal infarct (cited on or before 09-Dec-2020) When compared with ECG of 09-Dec-2020 12:01, Atrial fibrillation has replaced Sinus rhythm T wave inversion now evident in Inferior leads Confirmed by Rollene Rotunda ([id number]) on 03/08/2023 10:49:39 AM    Risk Assessment/Calculations:    CHA2DS2-VASc Score = 3   This indicates a 3.2% annual risk of stroke. The patient's score is based upon: CHF History: 0 HTN History: 0 Diabetes History: 0 Stroke History: 0 Vascular Disease History: 0 Age Score: 2 Gender Score: 1    Physical Exam:   VS:  BP 138/84 (BP Location: Right Arm, Patient  Position: Sitting, Cuff Size: Normal)   Pulse 98   Ht 5\' 2"  (1.575 m)   Wt 123 lb 9.6 oz (56.1 kg)   SpO2 96%   BMI 22.61 kg/m    Wt Readings from Last 3 Encounters:  03/08/23 123 lb 9.6 oz (56.1 kg)  02/22/23 123 lb (55.8 kg)  08/29/22 125 lb (56.7 kg)     GEN: Well nourished, well developed in no acute distress NECK: No JVD; No carotid bruits CARDIAC: Irregular RR, no murmurs, rubs, gallops RESPIRATORY:  Clear to auscultation without rales, wheezing or rhonchi  ABDOMEN: Soft, non-tender, non-distended EXTREMITIES:  No edema; No deformity.  Severe kyphoscoliosis  ASSESSMENT AND PLAN:   ATRIAL FIB:   The patient had paroxysmal atrial fibrillation.  Her CHA2DS2-VASc is 3.  She is in atrial fibrillation today and does not feel this.  She has good rate control.  I do not think there is any reason to pursue rhythm control.  She does need to be back on the 20 mg Xarelto.  She is up-to-date with blood work.   HYPOTHYROIDISM:   This is being regulated by [name], MD.  .  Last TSH was within normal limits.   AI:   This was mild on echo a few years ago.  I will follow-up with an echocardiogram.       Follow up with APP in  six months.   Signeed Rollene Rotunda, MD

## 2023-03-06 ENCOUNTER — Other Ambulatory Visit: Payer: Self-pay | Admitting: Family Medicine

## 2023-03-06 DIAGNOSIS — L299 Pruritus, unspecified: Secondary | ICD-10-CM

## 2023-03-08 ENCOUNTER — Encounter: Payer: Self-pay | Admitting: Cardiology

## 2023-03-08 ENCOUNTER — Ambulatory Visit: Payer: Medicare Other | Attending: Cardiology | Admitting: Cardiology

## 2023-03-08 VITALS — BP 138/84 | HR 98 | Ht 62.0 in | Wt 123.6 lb

## 2023-03-08 DIAGNOSIS — I061 Rheumatic aortic insufficiency: Secondary | ICD-10-CM | POA: Insufficient documentation

## 2023-03-08 DIAGNOSIS — I480 Paroxysmal atrial fibrillation: Secondary | ICD-10-CM | POA: Diagnosis not present

## 2023-03-08 MED ORDER — RIVAROXABAN 20 MG PO TABS: 20.0000 mg | ORAL_TABLET | Freq: Every day | ORAL | 3 refills | Status: DC

## 2023-03-08 NOTE — Patient Instructions (Signed)
Medication Instructions:  Your physician has recommended you make the following change in your medication:  - STOP taking Xarelto 15mg .  - Dr. Antoine Poche recommends START taking Xarelto 20mg , once daily with supper.    *If you need a refill on your cardiac medications before your next appointment, please call your pharmacy*    Testing/Procedures: Echo will be scheduled at [address] [facility] [address].  Your physician has requested that you have an echocardiogram. Echocardiography is a painless test that uses sound waves to create images of your heart. It provides your doctor with information about the size and shape of your heart and how well your heart's chambers and valves are working. This procedure takes approximately one hour. There are no restrictions for this procedure. Please do NOT wear cologne, perfume, aftershave, or lotions (deodorant is allowed). Please arrive 15 minutes prior to your appointment time.    Follow-Up: At [facility], you and your health needs are our priority.  As part of our continuing mission to provide you with exceptional heart care, we have created designated Provider Care Teams.  These Care Teams include your primary Cardiologist (physician) and Advanced Practice Providers (APPs -  Physician Assistants and Nurse Practitioners) who all work together to provide you with the care you need, when you need it.  We recommend signing up for the patient portal called "MyChart".  Sign up information is provided on this After Visit Summary.  MyChart is used to connect with patients for Virtual Visits (Telemedicine).  Patients are able to view lab/test results, encounter notes, upcoming appointments, etc.  Non-urgent messages can be sent to your provider as well.   To learn more about what you can do with MyChart, go to [email or web address].    Your next appointment:   6 month(s)  The format for your next appointment:   In Person  Provider:   Edd Fabian, FNP,  Robet Leu, PA-C, Rito Ehrlich, or Joni Reining, DNP, ANP

## 2023-03-10 ENCOUNTER — Ambulatory Visit
Admission: RE | Admit: 2023-03-10 | Discharge: 2023-03-10 | Disposition: A | Discharge status: Home / Self Care | Payer: Medicare Other | Source: Ambulatory Visit | Attending: Family Medicine | Admitting: Family Medicine

## 2023-03-10 DIAGNOSIS — R221 Localized swelling, mass and lump, neck: Secondary | ICD-10-CM

## 2023-03-14 ENCOUNTER — Ambulatory Visit (INDEPENDENT_AMBULATORY_CARE_PROVIDER_SITE_OTHER): Payer: Medicare Other | Admitting: Orthopaedic Surgery

## 2023-03-14 ENCOUNTER — Telehealth: Payer: Self-pay

## 2023-03-14 ENCOUNTER — Other Ambulatory Visit (INDEPENDENT_AMBULATORY_CARE_PROVIDER_SITE_OTHER): Payer: Medicare Other

## 2023-03-14 ENCOUNTER — Encounter: Payer: Self-pay | Admitting: Orthopaedic Surgery

## 2023-03-14 DIAGNOSIS — Z96641 Presence of right artificial hip joint: Secondary | ICD-10-CM

## 2023-03-14 NOTE — Progress Notes (Signed)
Office Visit Note   Patient: Kelly Mcdowell           Date of Birth: 11/14/1940           MRN: [id number] Visit Date: 03/14/2023              Requested by: [name], MD [address] [place],  [place] [address] PCP: [name], MD   Assessment & Plan: Visit Diagnoses:  1. History of total hip replacement, right   2. Status post total replacement of right hip     Plan: Kelly Mcdowell has done very well from her hip replacement.  She does get quad soreness from overexertion but the hip implant is doing well.  From my standpoint she can follow-up with [place] as needed.  Follow-Up Instructions: No follow-ups on file.   Orders:  Orders Placed This Encounter  Procedures   XR Pelvis 1-2 Views   No orders of the defined types were placed in this encounter.     Procedures: No procedures performed   Clinical Data: No additional findings.   Subjective: Chief Complaint  Patient presents with   Right Hip - Follow-up    Right total hip arthroplasty 12/14/2020    HPI Kelly Mcdowell is now 2 years postop from right total hip replacement.  She has no real complaints. Review of Systems   Objective: Vital Signs: There were no vitals taken for this visit.  Physical Exam  Ortho Exam Examination of right hip shows fully healed surgical scar.  Fluid painless range of motion. Specialty Comments:  No specialty comments available.  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   Epiretinal membrane, right eye 03/21/2022   Epiretinal membrane, left eye 03/21/2022   Posterior vitreous detachment of both eyes 03/21/2022   Lamellar macular hole of right eye 03/21/2022   Status post total replacement of right hip 12/14/2020   Primary osteoarthritis of right hip 09/29/2020   Primary osteoarthritis  of left hip 09/29/2020   Educated about COVID-19 virus infection 02/20/2020   Atrial fibrillation (HCC) 02/20/2020   Age-related osteoporosis without current pathological fracture 03/04/2019   Hypothyroidism 12/15/2016   Hip joint pain 09/16/2013   Lumbar pain with radiation down left leg 09/16/2013   Hypothyroidism (acquired) 06/18/2012   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   Thyroid disease    UTI (urinary tract infection)     Family History  Problem Relation Age of Onset   Congestive Heart Failure Father    Cancer Sister        breast   Breast cancer Sister    Colon polyps Sister    Cancer Maternal Grandmother    Breast cancer Maternal Aunt    Stomach cancer Maternal Aunt    Colon cancer Neg Hx    Esophageal cancer Neg Hx    Rectal cancer Neg Hx     Past Surgical History:  Procedure Laterality Date   CESAREAN SECTION     COLONOSCOPY     TOTAL HIP ARTHROPLASTY Right 12/14/2020   Procedure: RIGHT  TOTAL HIP ARTHROPLASTY ANTERIOR APPROACH;  Surgeon: Tarry Kos, MD;  Location: [facility] OR;  Service: Orthopedics;  Laterality: Right;  3-C   TUBAL LIGATION     Social History   Occupational History   Not on file  Tobacco Use   Smoking status: Former    Current packs/day: 0.00    Types: Cigarettes    Quit date: 03/04/2005    Years since quitting: 18.0   Smokeless tobacco: Never  Vaping Use   Vaping status: Never Used  Substance and Sexual Activity   Alcohol use: No   Drug use: No   Sexual activity: Never    Birth control/protection: Surgical

## 2023-03-14 NOTE — Telephone Encounter (Signed)
-----   Message from [name] sent at 03/14/2023 12:58 PM EDT ----- Call patient.  Ultrasound was overall reassuring, no solid lesions or cysts.  There is a benign-appearing lymph node in that area, possibly reactive.  This can be from inflammation or infection.  If any dental issues, face pain, mouth pain or swelling should be seen.  Otherwise we can recheck that area to make sure it improves over the next few weeks.  Let me know if there are questions.

## 2023-03-14 NOTE — Telephone Encounter (Signed)
Pt is aware of the results

## 2023-03-28 ENCOUNTER — Ambulatory Visit (HOSPITAL_COMMUNITY): Payer: Medicare Other | Attending: Internal Medicine

## 2023-03-28 DIAGNOSIS — I480 Paroxysmal atrial fibrillation: Secondary | ICD-10-CM | POA: Insufficient documentation

## 2023-03-28 LAB — ECHOCARDIOGRAM COMPLETE
Area-P 1/2: 4.54 cm2
P 1/2 time: 483 msec
S' Lateral: 2.6 cm

## 2023-04-04 ENCOUNTER — Other Ambulatory Visit: Payer: Self-pay | Admitting: *Deleted

## 2023-04-04 DIAGNOSIS — I061 Rheumatic aortic insufficiency: Secondary | ICD-10-CM

## 2023-04-05 ENCOUNTER — Encounter: Payer: Self-pay | Admitting: *Deleted

## 2023-04-06 ENCOUNTER — Ambulatory Visit: Payer: Medicare Other | Admitting: *Deleted

## 2023-04-06 DIAGNOSIS — Z0000 Encounter for general adult medical examination without abnormal findings: Secondary | ICD-10-CM | POA: Diagnosis not present

## 2023-04-06 NOTE — Patient Instructions (Signed)
Kelly Mcdowell , Thank you for taking time to come for your Medicare Wellness Visit. I appreciate your ongoing commitment to your health goals. Please review the following plan we discussed and let me know if I can assist you in the future.   Screening recommendations/referrals: Colonoscopy: no longer required Mammogram:  Bone Density: Education provided Recommended yearly ophthalmology/optometry visit for glaucoma screening and checkup Recommended yearly dental visit for hygiene and checkup  Vaccinations: Influenza vaccine: Education provided Pneumococcal vaccine: up tod ate Tdap vaccine: Education provided Shingles vaccine:     Advanced directives: Education provided    Preventive Care 65 Years and Older, Female Preventive care refers to lifestyle choices and visits with your health care provider that can promote health and wellness. What does preventive care include? A yearly physical exam. This is also called an annual well check. Dental exams once or twice a year. Routine eye exams. Ask your health care provider how often you should have your eyes checked. Personal lifestyle choices, including: Daily care of your teeth and gums. Regular physical activity. Eating a healthy diet. Avoiding tobacco and drug use. Limiting alcohol use. Practicing safe sex. Taking low-dose aspirin every day. Taking vitamin and mineral supplements as recommended by your health care provider. What happens during an annual well check? The services and screenings done by your health care provider during your annual well check will depend on your age, overall health, lifestyle risk factors, and family history of disease. Counseling  Your health care provider may ask you questions about your: Alcohol use. Tobacco use. Drug use. Emotional well-being. Home and relationship well-being. Sexual activity. Eating habits. History of falls. Memory and ability to understand (cognition). Work and work  Astronomer. Reproductive health. Screening  You may have the following tests or measurements: Height, weight, and BMI. Blood pressure. Lipid and cholesterol levels. These may be checked every 5 years, or more frequently if you are over 45 years old. Skin check. Lung cancer screening. You may have this screening every year starting at age 68 if you have a 30-pack-year history of smoking and currently smoke or have quit within the past 15 years. Fecal occult blood test (FOBT) of the stool. You may have this test every year starting at age 26. Flexible sigmoidoscopy or colonoscopy. You may have a sigmoidoscopy every 5 years or a colonoscopy every 10 years starting at age 61. Hepatitis C blood test. Hepatitis B blood test. Sexually transmitted disease (STD) testing. Diabetes screening. This is done by checking your blood sugar (glucose) after you have not eaten for a while (fasting). You may have this done every 1-3 years. Bone density scan. This is done to screen for osteoporosis. You may have this done starting at age 72. Mammogram. This may be done every 1-2 years. Talk to your health care provider about how often you should have regular mammograms. Talk with your health care provider about your test results, treatment options, and if necessary, the need for more tests. Vaccines  Your health care provider may recommend certain vaccines, such as: Influenza vaccine. This is recommended every year. Tetanus, diphtheria, and acellular pertussis (Tdap, Td) vaccine. You may need a Td booster every 10 years. Zoster vaccine. You may need this after age 51. Pneumococcal 13-valent conjugate (PCV13) vaccine. One dose is recommended after age 82. Pneumococcal polysaccharide (PPSV23) vaccine. One dose is recommended after age 60. Talk to your health care provider about which screenings and vaccines you need and how often you need them. This information is  not intended to replace advice given to you by  your health care provider. Make sure you discuss any questions you have with your health care provider. Document Released: 07/17/2015 Document Revised: 03/09/2016 Document Reviewed: 04/21/2015 Elsevier Interactive Patient Education  2017 [employer].  Fall Prevention in the Home Falls can cause injuries. They can happen to people of all ages. There are many things you can do to make your home safe and to help prevent falls. What can I do on the outside of my home? Regularly fix the edges of walkways and driveways and fix any cracks. Remove anything that might make you trip as you walk through a door, such as a raised step or threshold. Trim any bushes or trees on the path to your home. Use bright outdoor lighting. Clear any walking paths of anything that might make someone trip, such as rocks or tools. Regularly check to see if handrails are loose or broken. Make sure that both sides of any steps have handrails. Any raised decks and porches should have guardrails on the edges. Have any leaves, snow, or ice cleared regularly. Use sand or salt on walking paths during winter. Clean up any spills in your garage right away. This includes oil or grease spills. What can I do in the bathroom? Use night lights. Install grab bars by the toilet and in the tub and shower. Do not use towel bars as grab bars. Use non-skid mats or decals in the tub or shower. If you need to sit down in the shower, use a plastic, non-slip stool. Keep the floor dry. Clean up any water that spills on the floor as soon as it happens. Remove soap buildup in the tub or shower regularly. Attach bath mats securely with double-sided non-slip rug tape. Do not have throw rugs and other things on the floor that can make you trip. What can I do in the bedroom? Use night lights. Make sure that you have a light by your bed that is easy to reach. Do not use any sheets or blankets that are too big for your bed. They should not hang  down onto the floor. Have a firm chair that has side arms. You can use this for support while you get dressed. Do not have throw rugs and other things on the floor that can make you trip. What can I do in the kitchen? Clean up any spills right away. Avoid walking on wet floors. Keep items that you use a lot in easy-to-reach places. If you need to reach something above you, use a strong step stool that has a grab bar. Keep electrical cords out of the way. Do not use floor polish or wax that makes floors slippery. If you must use wax, use non-skid floor wax. Do not have throw rugs and other things on the floor that can make you trip. What can I do with my stairs? Do not leave any items on the stairs. Make sure that there are handrails on both sides of the stairs and use them. Fix handrails that are broken or loose. Make sure that handrails are as long as the stairways. Check any carpeting to make sure that it is firmly attached to the stairs. Fix any carpet that is loose or worn. Avoid having throw rugs at the top or bottom of the stairs. If you do have throw rugs, attach them to the floor with carpet tape. Make sure that you have a light switch at the top of the  stairs and the bottom of the stairs. If you do not have them, ask someone to add them for you. What else can I do to help prevent falls? Wear shoes that: Do not have high heels. Have rubber bottoms. Are comfortable and fit you well. Are closed at the toe. Do not wear sandals. If you use a stepladder: Make sure that it is fully opened. Do not climb a closed stepladder. Make sure that both sides of the stepladder are locked into place. Ask someone to hold it for you, if possible. Clearly mark and make sure that you can see: Any grab bars or handrails. First and last steps. Where the edge of each step is. Use tools that help you move around (mobility aids) if they are needed. These  include: Canes. Walkers. Scooters. Crutches. Turn on the lights when you go into a dark area. Replace any light bulbs as soon as they burn out. Set up your furniture so you have a clear path. Avoid moving your furniture around. If any of your floors are uneven, fix them. If there are any pets around you, be aware of where they are. Review your medicines with your doctor. Some medicines can make you feel dizzy. This can increase your chance of falling. Ask your doctor what other things that you can do to help prevent falls. This information is not intended to replace advice given to you by your health care provider. Make sure you discuss any questions you have with your health care provider. Document Released: 04/16/2009 Document Revised: 11/26/2015 Document Reviewed: 07/25/2014 Elsevier Interactive Patient Education  2017 [employer].

## 2023-04-06 NOTE — Progress Notes (Signed)
Subjective:   Kelly Mcdowell is a 82 y.o. female who presents for Medicare Annual (Subsequent) preventive examination.  Visit Complete: Virtual  I connected with  Maryjo Rochester on 04/06/23 by a audio enabled telemedicine application and verified that I am speaking with the correct person using two identifiers.  Patient Location: Home  Provider Location: Home Office  I discussed the limitations of evaluation and management by telemedicine. The patient expressed understanding and agreed to proceed.  Patient Medicare AWV questionnaire was completed by the patient on 03-30-2023; I have confirmed that all information answered by patient is correct and no changes since this date.  Because this visit was a virtual/telehealth visit, some criteria may be missing or patient reported. Any vitals not documented were not able to be obtained and vitals that have been documented are patient reported.    Cardiac Risk Factors include: advanced age (>65men, >63 women)     Objective:    There were no vitals filed for this visit. There is no height or weight on file to calculate BMI.     04/06/2023    8:35 AM 03/22/2022    2:52 PM 12/14/2020    3:55 PM 12/14/2020   11:11 AM 12/09/2020   11:08 AM 06/16/2020    1:25 PM 06/16/2020    1:06 PM  Advanced Directives  Does Patient Have a Medical Advance Directive? No Yes No No No No Yes;No  Type of Special educational needs teacher of [place];Living will     Healthcare Power of Attorney  Copy of Healthcare Power of Attorney in Chart?  No - copy requested       Would patient like information on creating a medical advance directive? No - Patient declined  No - Patient declined No - Patient declined No - Patient declined      Current Medications (verified) Outpatient Encounter Medications as of 04/06/2023  Medication Sig   cholecalciferol (VITAMIN D3) 25 MCG (1000 UNIT) tablet Take 2,000 Units by mouth daily.   famotidine (PEPCID) 20 MG tablet Take 20  mg by mouth daily as needed for heartburn or indigestion.   hydrOXYzine (ATARAX) 25 MG tablet TAKE 1/2 TO 1 (ONE-HALF TO ONE) TABLET BY MOUTH ONCE DAILY AS NEEDED FOR ITCHING   levothyroxine (SYNTHROID) 88 MCG tablet Take 1 tablet (88 mcg total) by mouth daily before breakfast. 3days per week - alternating with 1/2 tablet on other 4 days.   Menthol, Topical Analgesic, (BIOFREEZE ROLL-ON) 4 % GEL Apply 1 application. topically daily as needed (pain).   Multiple Vitamins-Minerals (PRESERVISION AREDS 2 PO) Take by mouth.   rivaroxaban (XARELTO) 20 MG TABS tablet Take 1 tablet (20 mg total) by mouth daily with supper.   No facility-administered encounter medications on file as of 04/06/2023.    Allergies (verified) Patient has no allergy information on record.   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   Procedure: RIGHT TOTAL HIP ARTHROPLASTY ANTERIOR APPROACH;  Surgeon: Tarry Kos, MD;  Location: [facility] OR;  Service: Orthopedics;  Laterality: Right;  3-C   TUBAL LIGATION     Family History  Problem Relation Age of Onset   Congestive Heart Failure Father    Cancer Sister        breast   Breast cancer Sister    Colon polyps Sister    Cancer Maternal Grandmother    Breast cancer Maternal Aunt    Stomach cancer Maternal Aunt    Colon cancer Neg Hx    Esophageal cancer Neg Hx    Rectal cancer Neg Hx    Social History   Socioeconomic History   Marital status: Married    Spouse name: Not on file   Number of children: Not on file   Years of education: Not on file   Highest education level: 12th grade  Occupational History    Not on file  Tobacco Use   Smoking status: Former    Current packs/day: 0.00    Types: Cigarettes    Quit date: 03/04/2005    Years since quitting: 18.1   Smokeless tobacco: Never  Vaping Use   Vaping status: Never Used  Substance and Sexual Activity   Alcohol use: No   Drug use: No   Sexual activity: Never    Birth control/protection: Surgical  Other Topics Concern   Not on file  Social History Narrative   Married and lives with husband.  Three daughters.  12 grands.     Social Determinants of Health   Financial Resource Strain: Low Risk  (04/06/2023)   Overall Financial Resource Strain (CARDIA)    Difficulty of Paying Living Expenses: Not hard at all  Food Insecurity: No Food Insecurity (04/06/2023)   Hunger Vital Sign    Worried About Running Out of Food in the Last Year: Never true    Ran Out of Food in the Last Year: Never true  Transportation Needs: No Transportation Needs (04/06/2023)   PRAPARE - Administrator, Civil Service (Medical): No    Lack of Transportation (Non-Medical): No  Physical Activity: Patient Declined (04/06/2023)   Exercise Vital Sign    Days of Exercise per Week: Patient declined    Minutes of Exercise per Session: Patient declined  Stress: No Stress Concern Present (04/06/2023)   [employer] of Occupational Health - Occupational Stress Questionnaire    Feeling of Stress : Only a little  Social Connections: Moderately Integrated (04/06/2023)   Social Connection and Isolation Panel [NHANES]    Frequency of Communication with Friends and Family: More than three times a week    Frequency of Social Gatherings with Friends and Family: Patient declined    Attends Religious Services: More than 4 times per year    Active Member of [employer] or Organizations: No    Attends Engineer, structural: Never    Marital Status: Married    Tobacco Counseling Counseling given: Not Answered   Clinical Intake:  Pre-visit preparation  completed: Yes  Pain : No/denies pain     Diabetes: No  How often do you need to have someone help you when you read instructions, pamphlets, or other written materials from your doctor or pharmacy?: 1 - Never  Interpreter Needed?: No  Information entered by :: Remi Haggard LPN   Activities of Daily Living    04/06/2023    8:39 AM 03/30/2023    2:07 PM  In your present state of health, do you have any difficulty performing the following activities:  Hearing? 0 0  Vision? 0 0  Difficulty concentrating or making decisions? 0 0  Walking or climbing stairs? 0 0  Dressing or bathing? 0 0  Doing errands, shopping? 0 0  Preparing Food and eating ? N N  Using the Toilet? N N  In the past six months, have you accidently leaked urine? Y Y  Do you have problems with loss of bowel control? N   Managing your Medications? N N  Managing your Finances? N N  Housekeeping or managing your Housekeeping? N N    Patient Care Team: [name], MD as PCP - General (Family Medicine) Rollene Rotunda, MD as PCP - Cardiology (Cardiology) Sallye Lat, MD as Consulting Physician (Ophthalmology)  Indicate any recent Medical Services you may have received from other than [facility] providers in the past year (date may be approximate).     Assessment:   This is a routine wellness examination for [place].  Hearing/Vision screen Hearing Screening - Comments:: No hearing aids Vision Screening - Comments:: Up to date [facility] doctor [place]   [facility]   Goals Addressed             This Visit's Progress    Patient Stated       Continue current lifestyle       Depression Screen    04/06/2023    8:40 AM 02/22/2023    9:43 AM 08/24/2022    1:03 PM 03/22/2022    2:47 PM 02/21/2022   11:35 AM 10/12/2021    2:12 PM 02/12/2021   10:45 AM  PHQ 2/9 Scores  PHQ - 2 Score 0 0 0 0 0 0 0  PHQ- 9 Score 1 0    0     Fall Risk    04/06/2023    8:34 AM 03/30/2023    2:07 PM 02/22/2023     9:43 AM 08/24/2022    1:03 PM 03/22/2022    2:46 PM  Fall Risk   Falls in the past year? 0 0 0 0 0  Number falls in past yr: 0  0 0 0  Injury with Fall? 0  0 0 0  Risk for fall due to :   No Fall Risks No Fall Risks No Fall Risks  Follow up Falls evaluation completed;Education provided;Falls prevention discussed  Falls evaluation completed Falls evaluation completed Falls prevention discussed    MEDICARE RISK AT HOME: Medicare Risk at Home Any stairs in or around the home?: Yes If so, are there any without handrails?: No Home free of loose throw rugs in walkways, pet beds, electrical cords, etc?: Yes Adequate lighting in your home to reduce risk of falls?: Yes Life alert?: No Use of a cane, walker or w/c?: No Grab bars in the bathroom?: Yes Shower chair or bench in shower?: No Elevated toilet seat or a handicapped toilet?: No  TIMED UP AND GO:  Was the test performed?  No    Cognitive Function:        04/06/2023    8:39 AM 03/22/2022    2:50 PM 06/16/2020    1:05 PM 05/15/2019    1:05 PM 05/02/2017   10:50 AM  6CIT Screen  What Year? 0 points 0 points 0 points 0 points 0 points  What month? 0 points 0 points 0 points 0 points 0 points  What time? 0 points 0 points 0 points 0 points 0 points  Count back from 20 0 points 0 points 0 points 0 points 0 points  Months in reverse 0 points 0 points 0 points 0 points 0 points  Repeat phrase 0 points 2 points 0 points 0 points 0 points  Total Score 0 points 2 points 0 points 0 points 0 points    Immunizations Immunization History  Administered Date(s) Administered   Pneumococcal Conjugate-13 03/13/2017   Pneumococcal Polysaccharide-23 05/27/2020    TDAP status: Due, Education has been provided regarding the importance of this vaccine. Advised may receive this vaccine at local pharmacy or Health Dept. Aware to provide a copy of the vaccination record if obtained from local pharmacy or Health Dept. Verbalized acceptance and  understanding.  Flu Vaccine status: Due, Education has been provided regarding the importance of this vaccine. Advised may receive this vaccine at local pharmacy or Health Dept. Aware to provide a copy of the vaccination record if obtained from local pharmacy or Health Dept. Verbalized acceptance and understanding.  Pneumococcal vaccine status: Up to date  Covid-19 vaccine status: Declined, Education has been provided regarding the importance of this vaccine but patient still declined. Advised may receive this vaccine at local pharmacy or Health Dept.or vaccine clinic. Aware to provide a copy of the vaccination record if obtained from local pharmacy or Health Dept. Verbalized acceptance and understanding.  Qualifies for Shingles Vaccine? Yes   Zostavax completed No   Shingrix Completed?: No.    Education has been provided regarding the importance of this vaccine. Patient has been advised to call insurance company to determine out of pocket expense if they have not yet received this vaccine. Advised may also receive vaccine at local pharmacy or Health Dept. Verbalized acceptance and understanding.  Screening Tests Health Maintenance  Topic Date Due   Hepatitis C Screening  08/25/2023 (Originally 04/23/1959)   INFLUENZA VACCINE  10/02/2023 (Originally 02/02/2023)   Medicare Annual Wellness (AWV)  04/05/2024   Pneumonia Vaccine 24+ Years old  Completed   DEXA SCAN  Completed   HPV VACCINES  Aged Out   DTaP/Tdap/Td  Discontinued   Colonoscopy  Discontinued   COVID-19 Vaccine  Discontinued   Zoster Vaccines- Shingrix  Discontinued    Health Maintenance  There are no preventive care reminders to display for this patient.   Colorectal cancer screening: No longer required.   Mammogram  Education provided  Bone Density status: Completed 2021. Results reflect: Bone density results: OSTEOPOROSIS. Repeat every 2 years.  Lung Cancer Screening: (Low Dose CT Chest recommended if Age 61-80 years,  20 pack-year currently smoking OR have quit w/in 15years.) does not qualify.   Lung Cancer Screening Referral:   Additional Screening:  Hepatitis C Screening:  never done  Vision Screening: Recommended annual ophthalmology exams for early detection of glaucoma and other disorders of the eye. Is the patient up to date with their annual eye exam?  Yes  Who is the provider or what is the name of the office in which the patient attends annual eye exams? [facility] If pt is not established with a provider, would they like to be referred to a provider to establish care? No .   Dental Screening: Recommended annual dental exams for proper oral hygiene    Community Resource Referral / Chronic Care Management: CRR required this visit?  No   CCM required this visit?  No     Plan:     I have personally reviewed and noted the following in the patient's chart:   Medical and social history Use of alcohol, tobacco or illicit drugs  Current medications and supplements  including opioid prescriptions. Patient is not currently taking opioid prescriptions. Functional ability and status Nutritional status Physical activity Advanced directives List of other physicians Hospitalizations, surgeries, and ER visits in previous 12 months Vitals Screenings to include cognitive, depression, and falls Referrals and appointments  In addition, I have reviewed and discussed with patient certain preventive protocols, quality metrics, and best practice recommendations. A written personalized care plan for preventive services as well as general preventive health recommendations were provided to patient.     Remi Haggard, LPN   16/07/958   After Visit Summary: (MyChart) Due to this being a telephonic visit, the after visit summary with patients personalized plan was offered to patient via MyChart   Nurse Notes:

## 2023-04-24 DIAGNOSIS — H353132 Nonexudative age-related macular degeneration, bilateral, intermediate dry stage: Secondary | ICD-10-CM | POA: Diagnosis not present

## 2023-04-24 DIAGNOSIS — H35373 Puckering of macula, bilateral: Secondary | ICD-10-CM | POA: Diagnosis not present

## 2023-07-28 ENCOUNTER — Telehealth: Payer: Self-pay | Admitting: Cardiology

## 2023-07-28 DIAGNOSIS — H40033 Anatomical narrow angle, bilateral: Secondary | ICD-10-CM | POA: Diagnosis not present

## 2023-07-28 DIAGNOSIS — H35373 Puckering of macula, bilateral: Secondary | ICD-10-CM | POA: Diagnosis not present

## 2023-07-28 DIAGNOSIS — Z961 Presence of intraocular lens: Secondary | ICD-10-CM | POA: Diagnosis not present

## 2023-07-28 DIAGNOSIS — H34211 Partial retinal artery occlusion, right eye: Secondary | ICD-10-CM | POA: Diagnosis not present

## 2023-07-28 DIAGNOSIS — H26492 Other secondary cataract, left eye: Secondary | ICD-10-CM | POA: Diagnosis not present

## 2023-07-28 NOTE — Telephone Encounter (Signed)
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 Provider is recommending a carotid ultrasound and echocardiogram.

## 2023-07-28 NOTE — Telephone Encounter (Signed)
Spoke with pt, she is not sure if the eye doctor knew she had that testing last year. Follow up scheduled per recall. Left message for [facility] to call us back as they are closed. Left message that testing was done last year and if they need testing faxed to them to please call back.

## 2023-08-02 NOTE — Telephone Encounter (Signed)
Spoke with [facility], aware she had the testing last year. Results of carotid and echo faxed to [phone]. They will call back if anything else needed.

## 2023-08-09 DIAGNOSIS — H26492 Other secondary cataract, left eye: Secondary | ICD-10-CM | POA: Diagnosis not present

## 2023-08-25 ENCOUNTER — Ambulatory Visit: Payer: Medicare Other | Admitting: Family Medicine

## 2023-09-25 ENCOUNTER — Other Ambulatory Visit: Payer: Self-pay | Admitting: *Deleted

## 2023-09-25 MED ORDER — RIVAROXABAN 20 MG PO TABS: 20.0000 mg | ORAL_TABLET | Freq: Every day | ORAL | 0 refills | Status: DC

## 2023-09-25 NOTE — Telephone Encounter (Signed)
 Prescription refill request for Xarelto received.  Indication: afib  Last office visit: hochrein, 03/08/2023 Weight: 56.1 kg  Age: 83 yo  Scr: 0.72, 02/22/2023 CrCl: 55 ml/min   Refill sent.

## 2023-09-28 ENCOUNTER — Encounter: Payer: Self-pay | Admitting: Family Medicine

## 2023-09-28 ENCOUNTER — Ambulatory Visit (INDEPENDENT_AMBULATORY_CARE_PROVIDER_SITE_OTHER): Payer: Medicare Other | Admitting: Family Medicine

## 2023-09-28 VITALS — BP 132/70 | HR 63 | Temp 98.7°F | Ht 62.0 in | Wt 117.8 lb

## 2023-09-28 DIAGNOSIS — I4891 Unspecified atrial fibrillation: Secondary | ICD-10-CM | POA: Diagnosis not present

## 2023-09-28 DIAGNOSIS — E039 Hypothyroidism, unspecified: Secondary | ICD-10-CM | POA: Diagnosis not present

## 2023-09-28 LAB — BASIC METABOLIC PANEL WITH GFR
BUN: 17 mg/dL (ref 6–23)
CO2: 30 meq/L (ref 19–32)
Calcium: 9 mg/dL (ref 8.4–10.5)
Chloride: 105 meq/L (ref 96–112)
Creatinine, Ser: 0.66 mg/dL (ref 0.40–1.20)
GFR: 82.84 mL/min (ref >60.00)
Glucose, Bld: 91 mg/dL (ref 70–99)
Potassium: 3.9 meq/L (ref 3.5–5.1)
Sodium: 143 meq/L (ref 135–145)

## 2023-09-28 LAB — CBC
HCT: 41.9 % (ref 36.0–46.0)
Hemoglobin: 14.1 g/dL (ref 12.0–15.0)
MCHC: 33.6 g/dL (ref 30.0–36.0)
MCV: 92.6 fl (ref 78.0–100.0)
Platelets: 270 10*3/uL (ref 150.0–400.0)
RBC: 4.52 Mil/uL (ref 3.87–5.11)
RDW: 13.3 % (ref 11.5–15.5)
WBC: 8.4 10*3/uL (ref 4.0–10.5)

## 2023-09-28 MED ORDER — LEVOTHYROXINE SODIUM 88 MCG PO TABS: 88.0000 ug | ORAL_TABLET | Freq: Every day | ORAL | 2 refills | Status: DC

## 2023-09-28 NOTE — Progress Notes (Signed)
 Subjective:  Patient ID: ALEXSIA KLINDT, female    DOB: 1941/04/01  Age: 83 y.o. MRN: [id number]  CC:  Chief Complaint  Patient presents with   Medical Management of Chronic Issues    Pt is well no concerns    HPI TERRISHA LOPATA presents for follow up - no new concerns.   Hypothyroidism: Lab Results  Component Value Date   TSH 4.87 02/22/2023  Treated with Synthroid 88 mcg on 3 days/week, 44 mcg on other 4 days. Taking medication daily.  No new hot or cold intolerance. No new hair or skin changes, heart palpitations or new fatigue. No new weight changes, some decreased weight. Not eating as much as in past. 2 meals per day and snacks.  Rare need for hydroxyzine- still has if needed.  Wt Readings from Last 3 Encounters:  09/28/23 117 lb 12.8 oz (53.4 kg)  03/08/23 123 lb 9.6 oz (56.1 kg)  02/22/23 123 lb (55.8 kg)   Paroxysmal atrial fibrillation Anticoagulated with Xarelto  -now on 20 mg mg daily without any new bleeding, or dark stools. No new bruising.  Left-sided neck fullness discussed in August.  Carotid Doppler without significant stenosis.  Soft tissue neck, benign-appearing nonpathologically enlarged left cervical lymph node presumably reactive in etiology and no discrete solid or cystic lesions. No recent neck swelling or new areas of concern.   Visit with cardiology, Dr. Antoine Poche March 08, 2023.  In atrial fibrillation although asymptomatic at that time, Xarelto dose changed from 15 to 20 mg.  Did not think there was a reason to pursue rhythm control. Appt planned 10/03/23 planned.  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   Epiretinal membrane, right eye 03/21/2022   Epiretinal membrane, left eye 03/21/2022   Posterior vitreous  detachment of both eyes 03/21/2022   Lamellar macular hole of right eye 03/21/2022   Status post total replacement of right hip 12/14/2020   Primary osteoarthritis of right hip 09/29/2020   Primary osteoarthritis of left hip 09/29/2020   Educated about COVID-19 virus infection 02/20/2020   Atrial fibrillation (HCC) 02/20/2020   Age-related osteoporosis without current pathological fracture 03/04/2019   Hypothyroidism 12/15/2016   Hip joint pain 09/16/2013   Lumbar pain with radiation down left leg 09/16/2013   Hypothyroidism (acquired) 06/18/2012   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 Authorizing Provider  cholecalciferol (VITAMIN D3) 25 MCG (1000 UNIT) tablet Take 2,000 Units by mouth daily.  Yes [provider]  famotidine (PEPCID) 20 MG tablet Take 20 mg by mouth daily as needed for heartburn or indigestion.   Yes [provider]  hydrOXYzine (ATARAX) 25 MG tablet TAKE 1/2 TO 1 (ONE-HALF TO ONE) TABLET BY MOUTH ONCE DAILY AS NEEDED FOR ITCHING 03/06/23  Yes [name], MD  levothyroxine (SYNTHROID) 88 MCG tablet Take 1 tablet (88 mcg total) by mouth daily before breakfast. 3days per week - alternating with 1/2 tablet on other 4 days. 02/22/23  Yes [name], MD  Menthol, Topical Analgesic, (BIOFREEZE ROLL-ON) 4 % GEL Apply 1  application. topically daily as needed (pain).   Yes [provider]  Multiple Vitamins-Minerals (PRESERVISION AREDS 2 PO) Take by mouth.   Yes [provider]  rivaroxaban (XARELTO) 20 MG TABS tablet Take 1 tablet (20 mg total) by mouth daily with supper. 09/25/23  Yes Rollene Rotunda, MD   Social History   Socioeconomic History   Marital status: Married    Spouse name: Not on file   Number of children: Not on file   Years of education: Not on file   Highest education level: 12th grade  Occupational History   Not on file  Tobacco Use   Smoking status: Former    Current packs/day: 0.00    Types: Cigarettes    Quit date: 03/04/2005    Years since quitting: 18.5   Smokeless tobacco: Never  Vaping Use   Vaping status: Never Used  Substance and Sexual Activity   Alcohol use: No   Drug use: No   Sexual activity: Never    Birth control/protection: Surgical  Other Topics Concern   Not on file  Social History Narrative   Married and lives with husband.  Three daughters.  12 grands.     Social Drivers of Corporate investment banker Strain: Low Risk  (09/27/2023)   Overall Financial Resource Strain (CARDIA)    Difficulty of Paying Living Expenses: Not hard at all  Food Insecurity: No Food Insecurity (09/27/2023)   Hunger Vital Sign    Worried About Running Out of Food in the Last Year: Never true    Ran Out of Food in the Last Year: Never true  Transportation Needs: No Transportation Needs (09/27/2023)   PRAPARE - Administrator, Civil Service (Medical): No    Lack of Transportation (Non-Medical): No  Physical Activity: Insufficiently Active (09/27/2023)   Exercise Vital Sign    Days of Exercise per Week: 2 days    Minutes of Exercise per Session: 20 min  Stress: No Stress Concern Present (09/27/2023)   [employer] of Occupational Health - Occupational Stress Questionnaire    Feeling of Stress : Not at all  Social Connections: Unknown (09/27/2023)    Social Connection and Isolation Panel [NHANES]    Frequency of Communication with Friends and Family: More than three times a week    Frequency of Social Gatherings with Friends and Family: Patient declined    Attends Religious Services: Patient declined    Database administrator or Organizations: No    Attends Banker Meetings: Never    Marital Status: Married  Catering manager Violence: Not At Risk (04/06/2023)   Humiliation, Afraid, Rape, and Kick questionnaire    Fear of Current or Ex-Partner: No    Emotionally Abused: No    Physically Abused: No    Sexually Abused: No    Review of Systems  Per HPI.  Objective:  Vitals:   09/28/23 1338  BP: 132/70  Pulse: 63  Temp: 98.7 F (37.1 C)  TempSrc: Temporal  SpO2: 99%  Weight: 117 lb 12.8 oz (53.4 kg)  Height: 5\' 2"  (1.575 m)     Physical Exam Vitals reviewed.  Constitutional:      Appearance: Normal appearance. She is well-developed.  HENT:     Head: Normocephalic and atraumatic.  Eyes:     Conjunctiva/sclera: Conjunctivae normal.     Pupils: Pupils are equal, round, and reactive to light.  Neck:     Vascular: No carotid bruit.  Cardiovascular:     Rate and Rhythm: Normal rate and regular rhythm.     Heart sounds: Normal heart sounds.     Comments: Regular rate, rhythm with rare ectopic beat Pulmonary:     Effort: Pulmonary effort is normal.     Breath sounds: Normal breath sounds.  Abdominal:     Palpations: Abdomen is soft. There is no pulsatile mass.     Tenderness: There is no abdominal tenderness.  Musculoskeletal:     Right lower leg: No edema.     Left lower leg: No edema.  Skin:    General: Skin is warm and dry.  Neurological:     Mental Status: She is alert and oriented to person, place, and time.  Psychiatric:        Mood and Affect: Mood normal.        Behavior: Behavior normal.     Assessment & Plan:  EDMONIA GONSER is a 83 y.o. female . Atrial fibrillation, unspecified  type (HCC) - Plan: Basic metabolic panel with GFR, CBC  -Tolerating current dose of anticoagulant.  Check monitoring labs above, keep follow-up with cardiology as planned.  Few ectopic beats but appears to be in regular rhythm in office today.  EKG was not performed.  Vital signs stable.  Hypothyroidism, unspecified type - Plan: levothyroxine (SYNTHROID) 88 MCG tablet, TSH  -Slightly increased in weight but attributes to some decreased portion sizes.  Still eating regular meals with snacks as needed.  Meal supplements discussed, regular meals and monitoring of weight at home discussed with RTC precautions if weight is decreasing.  Recheck 6 months.  Continue same dose Synthroid for now and adjust plan accordingly based on lab results.  Meds ordered this encounter  Medications   levothyroxine (SYNTHROID) 88 MCG tablet    Sig: Take 1 tablet (88 mcg total) by mouth daily before breakfast. 3days per week - alternating with 1/2 tablet on other 4 days.    Dispense:  90 tablet    Refill:  2   Patient Instructions  Make sure to continue to eat healthy meals and sufficient calories. Supplement like Ensure if needed. If unexplained weight loss, follow up so we can discuss other possible testing.   No med changes at this time.  I will let you know if any concerns on labs.  Keep follow-up with cardiology as planned,  take care!     Signed,   Meredith Staggers, MD [facility], [facility] 09/28/23 2:10 PM

## 2023-09-28 NOTE — Patient Instructions (Addendum)
 Make sure to continue to eat healthy meals and sufficient calories. Supplement like Ensure if needed. If unexplained weight loss, follow up so we can discuss other possible testing.   No med changes at this time.  I will let you know if any concerns on labs.  Keep follow-up with cardiology as planned,  take care!

## 2023-09-29 LAB — TSH: TSH: 0.32 u[IU]/mL — ABNORMAL LOW (ref 0.35–5.50)

## 2023-10-01 ENCOUNTER — Encounter: Payer: Self-pay | Admitting: Family Medicine

## 2023-10-01 NOTE — Progress Notes (Unsigned)
  Cardiology Office Note:   Date:  10/03/2023  ID:  DEVANSHI Mcdowell, DOB 26-Feb-1941, MRN [id number] PCP: [name], MD  [facility] Providers Cardiologist:  Rollene Rotunda, MD {  History of Present Illness:   Kelly Mcdowell is a 83 y.o. female  who presents for evaluation of atrial fibrillation.  Since I last saw her she has done okay.  She still lives with her husband.  She checks her heart rate routinely and it is in the 70s to 80s.  She does not feel any palpitations.  She does not have any presyncope or syncope.  She denies any chest pressure, neck or arm discomfort.  She has severe kyphosis but no significant pain associated.   ROS: As stated in the HPI and negative for all other systems.   Studies Reviewed:    EKG:   NA  Risk Assessment/Calculations:    CHA2DS2-VASc Score = 3   This indicates a 3.2% annual risk of stroke. The patient's score is based upon: CHF History: 0 HTN History: 0 Diabetes History: 0 Stroke History: 0 Vascular Disease History: 0 Age Score: 2 Gender Score: 1    Physical Exam:   VS:  BP 136/70 (BP Location: Left Arm, Patient Position: Sitting, Cuff Size: Normal)   Pulse 70   Ht 5\' 2"  (1.575 m)   Wt 118 lb 3.2 oz (53.6 kg)   SpO2 97%   BMI 21.62 kg/m    Wt Readings from Last 3 Encounters:  10/03/23 118 lb 3.2 oz (53.6 kg)  09/28/23 117 lb 12.8 oz (53.4 kg)  03/08/23 123 lb 9.6 oz (56.1 kg)     GEN: Well nourished, well developed in no acute distress NECK: No JVD; No carotid bruits CARDIAC: Irregular RR, no murmurs, rubs, gallops RESPIRATORY:  Clear to auscultation without rales, wheezing or rhonchi  ABDOMEN: Soft, non-tender, non-distended EXTREMITIES:  No edema; No deformity   ASSESSMENT AND PLAN:   ATRIAL FIB:   The patient had paroxysmal atrial fibrillation.  She tolerates anticoagulation.  She is up-to-date with blood work.  No change in therapy.  She has good rate control.  She does not feel her fibrillation.  There is  no indication for rhythm control.    Follow up with me in 12 months.   Signed, Rollene Rotunda, MD

## 2023-10-02 ENCOUNTER — Other Ambulatory Visit: Payer: Self-pay

## 2023-10-02 DIAGNOSIS — E039 Hypothyroidism, unspecified: Secondary | ICD-10-CM

## 2023-10-02 MED ORDER — LEVOTHYROXINE SODIUM 88 MCG PO TABS: 88.0000 ug | ORAL_TABLET | Freq: Every day | ORAL | 2 refills | Status: AC

## 2023-10-02 NOTE — Telephone Encounter (Signed)
 Copied from CRM [id number]. Topic: Clinical - Request for Lab/Test Order >> Oct 02, 2023  1:55 PM Martinique E wrote: Reason for CRM: Patient called in wanting to schedule future lab work. Scheduled patient for May 7th to have that TSH test completed. Advised by CAL to send message for clinic team to enter in TSH orders.

## 2023-10-02 NOTE — Telephone Encounter (Signed)
 Did send in medication as recommended by Dr.Greene, Called patient and sent a mychart message to schedule lab only visit. Left VM for patient to return call to schedule this.

## 2023-10-02 NOTE — Telephone Encounter (Signed)
 Lab work ordered

## 2023-10-03 ENCOUNTER — Ambulatory Visit: Payer: Medicare Other | Attending: Cardiology | Admitting: Cardiology

## 2023-10-03 ENCOUNTER — Encounter: Payer: Self-pay | Admitting: Cardiology

## 2023-10-03 VITALS — BP 136/70 | HR 70 | Ht 62.0 in | Wt 118.2 lb

## 2023-10-03 DIAGNOSIS — I480 Paroxysmal atrial fibrillation: Secondary | ICD-10-CM

## 2023-10-03 DIAGNOSIS — E039 Hypothyroidism, unspecified: Secondary | ICD-10-CM | POA: Diagnosis not present

## 2023-10-03 NOTE — Patient Instructions (Signed)
 Medication Instructions:  No changes.  *If you need a refill on your cardiac medications before your next appointment, please call your pharmacy*   Follow-Up: At [facility], you and your health needs are our priority.  As part of our continuing mission to provide you with exceptional heart care, our providers are all part of one team.  This team includes your primary Cardiologist (physician) and Advanced Practice Providers or APPs (Physician Assistants and Nurse Practitioners) who all work together to provide you with the care you need, when you need it.  Your next appointment:   1 year(s)  Provider:   Rollene Rotunda, MD     We recommend signing up for the patient portal called "MyChart".  Sign up information is provided on this After Visit Summary.  MyChart is used to connect with patients for Virtual Visits (Telemedicine).  Patients are able to view lab/test results, encounter notes, upcoming appointments, etc.  Non-urgent messages can be sent to your provider as well.   To learn more about what you can do with MyChart, go to [email or web address].   Other Instructions       1st Floor: - Lobby - Registration  - Pharmacy  - Lab - Cafe  2nd Floor: - PV Lab - Diagnostic Testing (echo, CT, nuclear med)  3rd Floor: - Vacant  4th Floor: - TCTS (cardiothoracic surgery) - AFib Clinic - Structural Heart Clinic - Vascular Surgery  - Vascular Ultrasound  5th Floor: - HeartCare Cardiology (general and EP) - Clinical Pharmacy for coumadin, hypertension, lipid, weight-loss medications, and med management appointments    Valet parking services will be available as well.

## 2023-11-08 ENCOUNTER — Encounter: Payer: Self-pay | Admitting: Family Medicine

## 2023-11-08 ENCOUNTER — Other Ambulatory Visit (INDEPENDENT_AMBULATORY_CARE_PROVIDER_SITE_OTHER)

## 2023-11-08 DIAGNOSIS — E039 Hypothyroidism, unspecified: Secondary | ICD-10-CM

## 2023-11-08 LAB — TSH: TSH: 5.01 u[IU]/mL (ref 0.35–5.50)

## 2023-11-09 NOTE — Telephone Encounter (Signed)
 Patient questioning if she needs to stay on both the and the 44 mcg for her thyroid ?

## 2023-11-28 DIAGNOSIS — H353221 Exudative age-related macular degeneration, left eye, with active choroidal neovascularization: Secondary | ICD-10-CM | POA: Diagnosis not present

## 2023-11-28 DIAGNOSIS — H35353 Cystoid macular degeneration, bilateral: Secondary | ICD-10-CM | POA: Diagnosis not present

## 2023-11-28 DIAGNOSIS — H353211 Exudative age-related macular degeneration, right eye, with active choroidal neovascularization: Secondary | ICD-10-CM | POA: Diagnosis not present

## 2023-11-28 DIAGNOSIS — H353132 Nonexudative age-related macular degeneration, bilateral, intermediate dry stage: Secondary | ICD-10-CM | POA: Diagnosis not present

## 2023-11-28 DIAGNOSIS — H35372 Puckering of macula, left eye: Secondary | ICD-10-CM | POA: Diagnosis not present

## 2023-11-28 DIAGNOSIS — H35371 Puckering of macula, right eye: Secondary | ICD-10-CM | POA: Diagnosis not present

## 2023-12-07 DIAGNOSIS — H353221 Exudative age-related macular degeneration, left eye, with active choroidal neovascularization: Secondary | ICD-10-CM | POA: Diagnosis not present

## 2023-12-07 DIAGNOSIS — H353211 Exudative age-related macular degeneration, right eye, with active choroidal neovascularization: Secondary | ICD-10-CM | POA: Diagnosis not present

## 2023-12-07 DIAGNOSIS — H35353 Cystoid macular degeneration, bilateral: Secondary | ICD-10-CM | POA: Diagnosis not present

## 2023-12-07 DIAGNOSIS — H35373 Puckering of macula, bilateral: Secondary | ICD-10-CM | POA: Diagnosis not present

## 2023-12-07 DIAGNOSIS — H353132 Nonexudative age-related macular degeneration, bilateral, intermediate dry stage: Secondary | ICD-10-CM | POA: Diagnosis not present

## 2023-12-08 ENCOUNTER — Other Ambulatory Visit: Payer: Self-pay | Admitting: Cardiology

## 2023-12-08 NOTE — Telephone Encounter (Signed)
 Prescription refill request for Xarelto  received.  Indication:afib Last office visit:4/25 Weight:53.6  kg Age:83 Scr:0.66  3/25 CrCl:57.53  ml/min  Prescription refilled

## 2024-01-11 ENCOUNTER — Encounter: Payer: Self-pay | Admitting: Family Medicine

## 2024-01-11 DIAGNOSIS — H353221 Exudative age-related macular degeneration, left eye, with active choroidal neovascularization: Secondary | ICD-10-CM | POA: Diagnosis not present

## 2024-01-11 DIAGNOSIS — H35353 Cystoid macular degeneration, bilateral: Secondary | ICD-10-CM | POA: Diagnosis not present

## 2024-01-11 DIAGNOSIS — H353132 Nonexudative age-related macular degeneration, bilateral, intermediate dry stage: Secondary | ICD-10-CM | POA: Diagnosis not present

## 2024-01-11 DIAGNOSIS — H353211 Exudative age-related macular degeneration, right eye, with active choroidal neovascularization: Secondary | ICD-10-CM | POA: Diagnosis not present

## 2024-01-11 DIAGNOSIS — H35373 Puckering of macula, bilateral: Secondary | ICD-10-CM | POA: Diagnosis not present

## 2024-01-15 DIAGNOSIS — H35353 Cystoid macular degeneration, bilateral: Secondary | ICD-10-CM | POA: Diagnosis not present

## 2024-01-15 DIAGNOSIS — H353221 Exudative age-related macular degeneration, left eye, with active choroidal neovascularization: Secondary | ICD-10-CM | POA: Diagnosis not present

## 2024-01-15 DIAGNOSIS — H353211 Exudative age-related macular degeneration, right eye, with active choroidal neovascularization: Secondary | ICD-10-CM | POA: Diagnosis not present

## 2024-01-15 DIAGNOSIS — H353132 Nonexudative age-related macular degeneration, bilateral, intermediate dry stage: Secondary | ICD-10-CM | POA: Diagnosis not present

## 2024-01-15 DIAGNOSIS — H35373 Puckering of macula, bilateral: Secondary | ICD-10-CM | POA: Diagnosis not present

## 2024-01-17 DIAGNOSIS — H40033 Anatomical narrow angle, bilateral: Secondary | ICD-10-CM | POA: Diagnosis not present

## 2024-01-17 DIAGNOSIS — Z961 Presence of intraocular lens: Secondary | ICD-10-CM | POA: Diagnosis not present

## 2024-01-17 DIAGNOSIS — H34211 Partial retinal artery occlusion, right eye: Secondary | ICD-10-CM | POA: Diagnosis not present

## 2024-01-17 DIAGNOSIS — H35373 Puckering of macula, bilateral: Secondary | ICD-10-CM | POA: Diagnosis not present

## 2024-02-19 DIAGNOSIS — H353221 Exudative age-related macular degeneration, left eye, with active choroidal neovascularization: Secondary | ICD-10-CM | POA: Diagnosis not present

## 2024-02-19 DIAGNOSIS — H35373 Puckering of macula, bilateral: Secondary | ICD-10-CM | POA: Diagnosis not present

## 2024-02-19 DIAGNOSIS — H353211 Exudative age-related macular degeneration, right eye, with active choroidal neovascularization: Secondary | ICD-10-CM | POA: Diagnosis not present

## 2024-02-19 DIAGNOSIS — H353132 Nonexudative age-related macular degeneration, bilateral, intermediate dry stage: Secondary | ICD-10-CM | POA: Diagnosis not present

## 2024-02-19 DIAGNOSIS — H35353 Cystoid macular degeneration, bilateral: Secondary | ICD-10-CM | POA: Diagnosis not present

## 2024-02-22 DIAGNOSIS — H353132 Nonexudative age-related macular degeneration, bilateral, intermediate dry stage: Secondary | ICD-10-CM | POA: Diagnosis not present

## 2024-02-22 DIAGNOSIS — H35353 Cystoid macular degeneration, bilateral: Secondary | ICD-10-CM | POA: Diagnosis not present

## 2024-02-22 DIAGNOSIS — H353221 Exudative age-related macular degeneration, left eye, with active choroidal neovascularization: Secondary | ICD-10-CM | POA: Diagnosis not present

## 2024-02-22 DIAGNOSIS — H35373 Puckering of macula, bilateral: Secondary | ICD-10-CM | POA: Diagnosis not present

## 2024-02-22 DIAGNOSIS — H353211 Exudative age-related macular degeneration, right eye, with active choroidal neovascularization: Secondary | ICD-10-CM | POA: Diagnosis not present

## 2024-03-11 ENCOUNTER — Ambulatory Visit (HOSPITAL_COMMUNITY)
Admission: RE | Admit: 2024-03-11 | Discharge: 2024-03-11 | Disposition: A | Discharge status: Home / Self Care | Source: Ambulatory Visit | Attending: Internal Medicine | Admitting: Internal Medicine

## 2024-03-11 DIAGNOSIS — I061 Rheumatic aortic insufficiency: Secondary | ICD-10-CM | POA: Insufficient documentation

## 2024-03-11 DIAGNOSIS — I359 Nonrheumatic aortic valve disorder, unspecified: Secondary | ICD-10-CM

## 2024-03-11 LAB — ECHOCARDIOGRAM COMPLETE
Area-P 1/2: 3.42 cm2
P 1/2 time: 383 ms
S' Lateral: 2.8 cm

## 2024-03-13 ENCOUNTER — Ambulatory Visit: Payer: Self-pay | Admitting: Cardiology

## 2024-03-25 DIAGNOSIS — H35372 Puckering of macula, left eye: Secondary | ICD-10-CM | POA: Diagnosis not present

## 2024-03-25 DIAGNOSIS — H353221 Exudative age-related macular degeneration, left eye, with active choroidal neovascularization: Secondary | ICD-10-CM | POA: Diagnosis not present

## 2024-03-25 DIAGNOSIS — H353132 Nonexudative age-related macular degeneration, bilateral, intermediate dry stage: Secondary | ICD-10-CM | POA: Diagnosis not present

## 2024-03-25 DIAGNOSIS — H353211 Exudative age-related macular degeneration, right eye, with active choroidal neovascularization: Secondary | ICD-10-CM | POA: Diagnosis not present

## 2024-03-25 DIAGNOSIS — H35353 Cystoid macular degeneration, bilateral: Secondary | ICD-10-CM | POA: Diagnosis not present

## 2024-03-25 DIAGNOSIS — H35371 Puckering of macula, right eye: Secondary | ICD-10-CM | POA: Diagnosis not present

## 2024-03-28 ENCOUNTER — Ambulatory Visit: Admitting: Family Medicine

## 2024-04-02 DIAGNOSIS — H35371 Puckering of macula, right eye: Secondary | ICD-10-CM | POA: Diagnosis not present

## 2024-04-02 DIAGNOSIS — H35372 Puckering of macula, left eye: Secondary | ICD-10-CM | POA: Diagnosis not present

## 2024-04-02 DIAGNOSIS — H353211 Exudative age-related macular degeneration, right eye, with active choroidal neovascularization: Secondary | ICD-10-CM | POA: Diagnosis not present

## 2024-04-02 DIAGNOSIS — H353221 Exudative age-related macular degeneration, left eye, with active choroidal neovascularization: Secondary | ICD-10-CM | POA: Diagnosis not present

## 2024-04-02 DIAGNOSIS — H353132 Nonexudative age-related macular degeneration, bilateral, intermediate dry stage: Secondary | ICD-10-CM | POA: Diagnosis not present

## 2024-04-02 DIAGNOSIS — H35353 Cystoid macular degeneration, bilateral: Secondary | ICD-10-CM | POA: Diagnosis not present

## 2024-04-02 LAB — OPHTHALMOLOGY REPORT-SCANNED

## 2024-04-03 ENCOUNTER — Ambulatory Visit: Admitting: Family Medicine

## 2024-04-03 ENCOUNTER — Encounter: Payer: Self-pay | Admitting: Family Medicine

## 2024-04-03 VITALS — BP 138/72 | HR 68 | Temp 98.9°F | Wt 119.0 lb

## 2024-04-03 DIAGNOSIS — M25559 Pain in unspecified hip: Secondary | ICD-10-CM | POA: Diagnosis not present

## 2024-04-03 DIAGNOSIS — E039 Hypothyroidism, unspecified: Secondary | ICD-10-CM

## 2024-04-03 DIAGNOSIS — I4891 Unspecified atrial fibrillation: Secondary | ICD-10-CM

## 2024-04-03 NOTE — Patient Instructions (Signed)
 Thanks for coming in today.  Again, I am sorry about your loss earlier this summer.  No change in meds at this time but if any concerns on labs I will let you know.  Take care.

## 2024-04-03 NOTE — Progress Notes (Signed)
 Subjective:  Patient ID: Kelly Mcdowell, female    DOB: February 24, 1941  Age: 83 y.o. MRN: [id number]  CC:  Chief Complaint  Patient presents with   Medical Management of Chronic Issues    6 month recheck     HPI Kelly Mcdowell presents for follow up.  Tough summer. Lost a grandson in July.  Doing ok otherwise. No persistent joint pains. Occasional hip pais that improve with movement. Has some difficulty trying to stand up straight with back, but not limiting activities or pain typically. Needs repeat handicap placard.  Hypothyroidism: Lab Results  Component Value Date   TSH 5.01 11/08/2023  Synthroid  88 mcg 2 days/week, 44 mcg on other 5 days.  No new side effects.  Consistent with medication use. Taking medication daily.  No new hot or cold intolerance. No new hair or skin changes, heart palpitations or new fatigue. No new weight changes.  Wt Readings from Last 3 Encounters:  04/03/24 119 lb (54 kg)  10/03/23 118 lb 3.2 oz (53.6 kg)  09/28/23 117 lb 12.8 oz (53.4 kg)   Paroxysmal atrial fibrillation Anticoagulation with Xarelto  20 mg daily without any bleeding dark stools or new bruising.  Has seen cardiology previously without need to pursue rhythm control. Recent echocardiogram, note reviewed from Dr. Lavona in indicating moderate to severe aortic insufficiency that was unchanged.  Normal left ventricular size and function.  Plan for follow-up in April of next year followed by echo next year.  No change in therapy. No chest pains, rare palpitations at night, infrequent, has discussed with cardiology - fleeting symptoms.   Under care of Dr. Elner for macular degeneration. Receives injections.   History of pruritus Hydroxyzine  as needed in the past with potential side effects and risks discussed. No recent use/need - infrequent use.   Health maintenance: Flu vaccine recommended, declined.   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   Epiretinal membrane, right eye 03/21/2022   Epiretinal membrane, left eye 03/21/2022   Posterior vitreous detachment of both eyes 03/21/2022   Lamellar macular hole of right eye 03/21/2022   Status post total replacement of right hip 12/14/2020   Primary osteoarthritis of right hip 09/29/2020   Primary osteoarthritis of left hip 09/29/2020   Educated about COVID-19 virus infection 02/20/2020   Atrial fibrillation (HCC) 02/20/2020   Age-related osteoporosis without current pathological fracture 03/04/2019   Hypothyroidism 12/15/2016   Hip joint pain 09/16/2013   Lumbar pain with radiation down left leg 09/16/2013   Hypothyroidism (acquired) 06/18/2012   Past Medical History:  Diagnosis Date   Allergy    Arthritis    Cataract    both eyes   Chronic atrial fibrillation (HCC)    Hypothyroidism    Leaky heart valve    Osteoporosis    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 Authorizing Provider  cholecalciferol (VITAMIN D3) 25 MCG (1000 UNIT) tablet Take 2,000 Units by mouth daily.   Yes [provider]  famotidine  (PEPCID ) 20 MG tablet Take 20 mg by mouth daily as needed for heartburn or indigestion.   Yes [provider]  hydrOXYzine  (ATARAX ) 25 MG tablet TAKE 1/2 TO 1 (ONE-HALF TO ONE) TABLET BY MOUTH ONCE DAILY AS NEEDED FOR  ITCHING 03/06/23  Yes Levora Reyes SAUNDERS, MD  Menthol , Topical Analgesic, (BIOFREEZE ROLL-ON) 4 % GEL Apply 1 application. topically daily as needed (pain).   Yes [provider]  Multiple Vitamins-Minerals (PRESERVISION AREDS 2 PO) Take 1 capsule by mouth 2 (two) times daily.   Yes [provider]  XARELTO  20 MG TABS tablet TAKE 1 TABLET EVERY DAY WITH SUPPER 12/08/23  Yes Lavona Agent, MD  levothyroxine  (SYNTHROID ) 88 MCG tablet Take 1 tablet (88 mcg total) by mouth daily before breakfast. Patient taking differently: Take 88 mcg by mouth daily before breakfast. 1Tablet twice weekly and a 1/2 tablet five times weekly 10/02/23   Levora Reyes SAUNDERS, MD   Social History   Socioeconomic History   Marital status: Married    Spouse name: Not on file   Number of children: Not on file   Years of education: Not on file   Highest education level: 12th grade  Occupational History   Not on file  Tobacco Use   Smoking status: Former    Current packs/day: 0.00    Types: Cigarettes    Quit date: 03/04/2005    Years since quitting: 19.0   Smokeless tobacco: Never  Vaping Use   Vaping status: Never Used  Substance and Sexual Activity   Alcohol use: No   Drug use: No   Sexual activity: Never    Birth control/protection: Surgical  Other Topics Concern   Not on file  Social History Narrative   Married and lives with husband.  Three daughters.  12 grands.     Social Drivers of Corporate investment banker Strain: Low Risk  (09/27/2023)   Overall Financial Resource Strain (CARDIA)    Difficulty of Paying Living Expenses: Not hard at all  Food Insecurity: No Food Insecurity (09/27/2023)   Hunger Vital Sign    Worried About Running Out of Food in the Last Year: Never true    Ran Out of Food in the Last Year: Never true  Transportation Needs: No Transportation Needs (09/27/2023)   PRAPARE - Administrator, Civil Service (Medical): No    Lack of Transportation (Non-Medical):  No  Physical Activity: Insufficiently Active (09/27/2023)   Exercise Vital Sign    Days of Exercise per Week: 2 days    Minutes of Exercise per Session: 20 min  Stress: No Stress Concern Present (09/27/2023)   [employer] of Occupational Health - Occupational Stress Questionnaire    Feeling of Stress : Not at all  Social Connections: Unknown (09/27/2023)   Social Connection and Isolation Panel    Frequency of Communication with Friends and Family: More than three times a week    Frequency of Social Gatherings with Friends and Family: Patient declined    Attends Religious Services: Patient declined    Database administrator or Organizations: No    Attends Banker Meetings: Never    Marital Status: Married  Catering manager Violence: Not At Risk (04/06/2023)   Humiliation, Afraid, Rape, and Kick questionnaire  Fear of Current or Ex-Partner: No    Emotionally Abused: No    Physically Abused: No    Sexually Abused: No    Review of Systems  Per HPI  Objective:   Vitals:   04/03/24 1246  BP: 138/72  Pulse: 68  Temp: 98.9 F (37.2 C)  TempSrc: Temporal  SpO2: 98%  Weight: 119 lb (54 kg)     Physical Exam Vitals reviewed.  Constitutional:      Appearance: Normal appearance. She is well-developed.  HENT:     Head: Normocephalic and atraumatic.  Eyes:     Conjunctiva/sclera: Conjunctivae normal.     Pupils: Pupils are equal, round, and reactive to light.  Neck:     Vascular: No carotid bruit.  Cardiovascular:     Rate and Rhythm: Normal rate and regular rhythm.     Heart sounds: Normal heart sounds.  Pulmonary:     Effort: Pulmonary effort is normal.     Breath sounds: Normal breath sounds.  Abdominal:     Palpations: Abdomen is soft. There is no pulsatile mass.     Tenderness: There is no abdominal tenderness.  Musculoskeletal:     Right lower leg: No edema.     Left lower leg: No edema.  Skin:    General: Skin is warm and dry.  Neurological:      Mental Status: She is alert and oriented to person, place, and time.  Psychiatric:        Mood and Affect: Mood normal.        Behavior: Behavior normal.     Assessment & Plan:  Kelly Mcdowell is a 83 y.o. female . Hypothyroidism, unspecified type - Plan: TSH  -  Stable, tolerating current regimen. Medications refilled. Labs pending as above.   Atrial fibrillation, unspecified type (HCC) - Plan: CBC  - Infrequent suspected flares, has seen cardiology, no change in regimen.  Tolerating anticoagulation current dose, continue same, check CBC as above.  Recent echo results and plan per cardiology as above.  87-month follow-up  Intermittent hip pains, arthralgias, stable at this time.  Handicap placard provided.  RTC precautions given.   No orders of the defined types were placed in this encounter.  Patient Instructions  Thanks for coming in today.  Again, I am sorry about your loss earlier this summer.  No change in meds at this time but if any concerns on labs I will let you know.  Take care.     Signed,   Reyes Pines, MD [facility], [facility] 04/03/24 1:21 PM

## 2024-04-04 LAB — CBC
HCT: 41.8 % (ref 36.0–46.0)
Hemoglobin: 13.7 g/dL (ref 12.0–15.0)
MCHC: 32.8 g/dL (ref 30.0–36.0)
MCV: 92.8 fl (ref 78.0–100.0)
Platelets: 225 K/uL (ref 150.0–400.0)
RBC: 4.51 Mil/uL (ref 3.87–5.11)
RDW: 13.6 % (ref 11.5–15.5)
WBC: 5.8 K/uL (ref 4.0–10.5)

## 2024-04-04 LAB — TSH: TSH: 2.27 u[IU]/mL (ref 0.35–5.50)

## 2024-04-08 ENCOUNTER — Ambulatory Visit: Payer: Self-pay | Admitting: Family Medicine

## 2024-04-29 DIAGNOSIS — H35353 Cystoid macular degeneration, bilateral: Secondary | ICD-10-CM | POA: Diagnosis not present

## 2024-04-29 DIAGNOSIS — H353132 Nonexudative age-related macular degeneration, bilateral, intermediate dry stage: Secondary | ICD-10-CM | POA: Diagnosis not present

## 2024-04-29 DIAGNOSIS — H35372 Puckering of macula, left eye: Secondary | ICD-10-CM | POA: Diagnosis not present

## 2024-04-29 DIAGNOSIS — H35371 Puckering of macula, right eye: Secondary | ICD-10-CM | POA: Diagnosis not present

## 2024-04-29 DIAGNOSIS — H353221 Exudative age-related macular degeneration, left eye, with active choroidal neovascularization: Secondary | ICD-10-CM | POA: Diagnosis not present

## 2024-04-29 DIAGNOSIS — H353211 Exudative age-related macular degeneration, right eye, with active choroidal neovascularization: Secondary | ICD-10-CM | POA: Diagnosis not present

## 2024-04-29 LAB — OPHTHALMOLOGY REPORT-SCANNED

## 2024-05-14 DIAGNOSIS — H35353 Cystoid macular degeneration, bilateral: Secondary | ICD-10-CM | POA: Diagnosis not present

## 2024-05-14 DIAGNOSIS — H35371 Puckering of macula, right eye: Secondary | ICD-10-CM | POA: Diagnosis not present

## 2024-05-14 DIAGNOSIS — H353132 Nonexudative age-related macular degeneration, bilateral, intermediate dry stage: Secondary | ICD-10-CM | POA: Diagnosis not present

## 2024-05-14 DIAGNOSIS — H35372 Puckering of macula, left eye: Secondary | ICD-10-CM | POA: Diagnosis not present

## 2024-05-14 DIAGNOSIS — H353221 Exudative age-related macular degeneration, left eye, with active choroidal neovascularization: Secondary | ICD-10-CM | POA: Diagnosis not present

## 2024-05-14 DIAGNOSIS — H353211 Exudative age-related macular degeneration, right eye, with active choroidal neovascularization: Secondary | ICD-10-CM | POA: Diagnosis not present

## 2024-05-14 LAB — OPHTHALMOLOGY REPORT-SCANNED

## 2024-06-03 DIAGNOSIS — H35372 Puckering of macula, left eye: Secondary | ICD-10-CM | POA: Diagnosis not present

## 2024-06-03 DIAGNOSIS — H353132 Nonexudative age-related macular degeneration, bilateral, intermediate dry stage: Secondary | ICD-10-CM | POA: Diagnosis not present

## 2024-06-03 DIAGNOSIS — H35371 Puckering of macula, right eye: Secondary | ICD-10-CM | POA: Diagnosis not present

## 2024-06-03 DIAGNOSIS — H35353 Cystoid macular degeneration, bilateral: Secondary | ICD-10-CM | POA: Diagnosis not present

## 2024-06-03 DIAGNOSIS — H353221 Exudative age-related macular degeneration, left eye, with active choroidal neovascularization: Secondary | ICD-10-CM | POA: Diagnosis not present

## 2024-06-03 DIAGNOSIS — H353211 Exudative age-related macular degeneration, right eye, with active choroidal neovascularization: Secondary | ICD-10-CM | POA: Diagnosis not present

## 2024-06-03 LAB — OPHTHALMOLOGY REPORT-SCANNED

## 2024-06-18 DIAGNOSIS — H35353 Cystoid macular degeneration, bilateral: Secondary | ICD-10-CM | POA: Diagnosis not present

## 2024-06-18 DIAGNOSIS — H35371 Puckering of macula, right eye: Secondary | ICD-10-CM | POA: Diagnosis not present

## 2024-06-18 DIAGNOSIS — H353221 Exudative age-related macular degeneration, left eye, with active choroidal neovascularization: Secondary | ICD-10-CM | POA: Diagnosis not present

## 2024-06-18 DIAGNOSIS — H35372 Puckering of macula, left eye: Secondary | ICD-10-CM | POA: Diagnosis not present

## 2024-06-18 DIAGNOSIS — H353132 Nonexudative age-related macular degeneration, bilateral, intermediate dry stage: Secondary | ICD-10-CM | POA: Diagnosis not present

## 2024-06-18 DIAGNOSIS — H353211 Exudative age-related macular degeneration, right eye, with active choroidal neovascularization: Secondary | ICD-10-CM | POA: Diagnosis not present

## 2024-06-18 LAB — OPHTHALMOLOGY REPORT-SCANNED

## 2024-07-23 LAB — OPHTHALMOLOGY REPORT-SCANNED

## 2024-08-01 ENCOUNTER — Ambulatory Visit: Admitting: *Deleted

## 2024-08-01 VITALS — Ht 62.0 in | Wt 119.0 lb

## 2024-08-01 DIAGNOSIS — Z0000 Encounter for general adult medical examination without abnormal findings: Secondary | ICD-10-CM | POA: Diagnosis not present

## 2024-08-01 NOTE — Patient Instructions (Signed)
 Kelly Mcdowell,  Thank you for taking the time for your Medicare Wellness Visit. I appreciate your continued commitment to your health goals. Please review the care plan we discussed, and feel free to reach out if I can assist you further.  Please note that Annual Wellness Visits do not include a physical exam. Some assessments may be limited, especially if the visit was conducted virtually. If needed, we may recommend an in-person follow-up with your provider.  Ongoing Care Seeing your primary care provider every 3 to 6 months helps us  monitor your health and provide consistent, personalized care.   Referrals If a referral was made during today's visit and you haven't received any updates within two weeks, please contact the referred provider directly to check on the status.  Recommended Screenings:  Health Maintenance  Topic Date Due   Flu Shot  10/01/2024*   Medicare Annual Wellness Visit  08/01/2025   Pneumococcal Vaccine for age over 36  Completed   Osteoporosis screening with Bone Density Scan  Completed   Meningitis B Vaccine  Aged Out   DTaP/Tdap/Td vaccine  Discontinued   Colon Cancer Screening  Discontinued   COVID-19 Vaccine  Discontinued   Zoster (Shingles) Vaccine  Discontinued  *Topic was postponed. The date shown is not the original due date.       08/01/2024   11:32 AM  Advanced Directives  Does Patient Have a Medical Advance Directive? No    Vision: Annual vision screenings are recommended for early detection of glaucoma, cataracts, and diabetic retinopathy. These exams can also reveal signs of chronic conditions such as diabetes and high blood pressure.  Dental: Annual dental screenings help detect early signs of oral cancer, gum disease, and other conditions linked to overall health, including heart disease and diabetes.  Please see the attached documents for additional preventive care recommendations.    Kelly Mcdowell , Thank you for taking time to come for your  Medicare Wellness Visit. I appreciate your ongoing commitment to your health goals. Please review the following plan we discussed and let me know if I can assist you in the future.   Screening recommendations/referrals: Colonoscopy:  Mammogram:  Bone Density:  Recommended yearly ophthalmology/optometry visit for glaucoma screening and checkup Recommended yearly dental visit for hygiene and checkup  Vaccinations: Influenza vaccine:  Pneumococcal vaccine:  Tdap vaccine:  Shingles vaccine:       Preventive Care 65 Years and Older, Female Preventive care refers to lifestyle choices and visits with your health care provider that can promote health and wellness. What does preventive care include? A yearly physical exam. This is also called an annual well check. Dental exams once or twice a year. Routine eye exams. Ask your health care provider how often you should have your eyes checked. Personal lifestyle choices, including: Daily care of your teeth and gums. Regular physical activity. Eating a healthy diet. Avoiding tobacco and drug use. Limiting alcohol use. Practicing safe sex. Taking low-dose aspirin  every day. Taking vitamin and mineral supplements as recommended by your health care provider. What happens during an annual well check? The services and screenings done by your health care provider during your annual well check will depend on your age, overall health, lifestyle risk factors, and family history of disease. Counseling  Your health care provider may ask you questions about your: Alcohol use. Tobacco use. Drug use. Emotional well-being. Home and relationship well-being. Sexual activity. Eating habits. History of falls. Memory and ability to understand (cognition). Work and work  environment. Reproductive health. Screening  You may have the following tests or measurements: Height, weight, and BMI. Blood pressure. Lipid and cholesterol levels. These may be  checked every 5 years, or more frequently if you are over 54 years old. Skin check. Lung cancer screening. You may have this screening every year starting at age 51 if you have a 30-pack-year history of smoking and currently smoke or have quit within the past 15 years. Fecal occult blood test (FOBT) of the stool. You may have this test every year starting at age 54. Flexible sigmoidoscopy or colonoscopy. You may have a sigmoidoscopy every 5 years or a colonoscopy every 10 years starting at age 4. Hepatitis C blood test. Hepatitis B blood test. Sexually transmitted disease (STD) testing. Diabetes screening. This is done by checking your blood sugar (glucose) after you have not eaten for a while (fasting). You may have this done every 1-3 years. Bone density scan. This is done to screen for osteoporosis. You may have this done starting at age 31. Mammogram. This may be done every 1-2 years. Talk to your health care provider about how often you should have regular mammograms. Talk with your health care provider about your test results, treatment options, and if necessary, the need for more tests. Vaccines  Your health care provider may recommend certain vaccines, such as: Influenza vaccine. This is recommended every year. Tetanus, diphtheria, and acellular pertussis (Tdap, Td) vaccine. You may need a Td booster every 10 years. Zoster vaccine. You may need this after age 75. Pneumococcal 13-valent conjugate (PCV13) vaccine. One dose is recommended after age 79. Pneumococcal polysaccharide (PPSV23) vaccine. One dose is recommended after age 65. Talk to your health care provider about which screenings and vaccines you need and how often you need them. This information is not intended to replace advice given to you by your health care provider. Make sure you discuss any questions you have with your health care provider. Document Released: 07/17/2015 Document Revised: 03/09/2016 Document Reviewed:  04/21/2015 Elsevier Interactive Patient Education  2017 [employer].  Fall Prevention in the Home Falls can cause injuries. They can happen to people of all ages. There are many things you can do to make your home safe and to help prevent falls. What can I do on the outside of my home? Regularly fix the edges of walkways and driveways and fix any cracks. Remove anything that might make you trip as you walk through a door, such as a raised step or threshold. Trim any bushes or trees on the path to your home. Use bright outdoor lighting. Clear any walking paths of anything that might make someone trip, such as rocks or tools. Regularly check to see if handrails are loose or broken. Make sure that both sides of any steps have handrails. Any raised decks and porches should have guardrails on the edges. Have any leaves, snow, or ice cleared regularly. Use sand or salt on walking paths during winter. Clean up any spills in your garage right away. This includes oil or grease spills. What can I do in the bathroom? Use night lights. Install grab bars by the toilet and in the tub and shower. Do not use towel bars as grab bars. Use non-skid mats or decals in the tub or shower. If you need to sit down in the shower, use a plastic, non-slip stool. Keep the floor dry. Clean up any water  that spills on the floor as soon as it happens. Remove soap buildup in  the tub or shower regularly. Attach bath mats securely with double-sided non-slip rug tape. Do not have throw rugs and other things on the floor that can make you trip. What can I do in the bedroom? Use night lights. Make sure that you have a light by your bed that is easy to reach. Do not use any sheets or blankets that are too big for your bed. They should not hang down onto the floor. Have a firm chair that has side arms. You can use this for support while you get dressed. Do not have throw rugs and other things on the floor that can make you  trip. What can I do in the kitchen? Clean up any spills right away. Avoid walking on wet floors. Keep items that you use a lot in easy-to-reach places. If you need to reach something above you, use a strong step stool that has a grab bar. Keep electrical cords out of the way. Do not use floor polish or wax that makes floors slippery. If you must use wax, use non-skid floor wax. Do not have throw rugs and other things on the floor that can make you trip. What can I do with my stairs? Do not leave any items on the stairs. Make sure that there are handrails on both sides of the stairs and use them. Fix handrails that are broken or loose. Make sure that handrails are as long as the stairways. Check any carpeting to make sure that it is firmly attached to the stairs. Fix any carpet that is loose or worn. Avoid having throw rugs at the top or bottom of the stairs. If you do have throw rugs, attach them to the floor with carpet tape. Make sure that you have a light switch at the top of the stairs and the bottom of the stairs. If you do not have them, ask someone to add them for you. What else can I do to help prevent falls? Wear shoes that: Do not have high heels. Have rubber bottoms. Are comfortable and fit you well. Are closed at the toe. Do not wear sandals. If you use a stepladder: Make sure that it is fully opened. Do not climb a closed stepladder. Make sure that both sides of the stepladder are locked into place. Ask someone to hold it for you, if possible. Clearly mark and make sure that you can see: Any grab bars or handrails. First and last steps. Where the edge of each step is. Use tools that help you move around (mobility aids) if they are needed. These include: Canes. Walkers. Scooters. Crutches. Turn on the lights when you go into a dark area. Replace any light bulbs as soon as they burn out. Set up your furniture so you have a clear path. Avoid moving your furniture  around. If any of your floors are uneven, fix them. If there are any pets around you, be aware of where they are. Review your medicines with your doctor. Some medicines can make you feel dizzy. This can increase your chance of falling. Ask your doctor what other things that you can do to help prevent falls. This information is not intended to replace advice given to you by your health care provider. Make sure you discuss any questions you have with your health care provider. Document Released: 04/16/2009 Document Revised: 11/26/2015 Document Reviewed: 07/25/2014 Elsevier Interactive Patient Education  2017 [employer].

## 2024-08-01 NOTE — Progress Notes (Signed)
 "  Chief Complaint  Patient presents with   Medicare Wellness     Subjective:   Kelly Mcdowell is a 84 y.o. female who presents for a Medicare Annual Wellness Visit.  No voiced or noted concerns at this time Patient advised to keep follow-up appointment with PCP (10-02-2024)   Visit info / Clinical Intake: Medicare Wellness Visit Type:: Subsequent Annual Wellness Visit Persons participating in visit and providing information:: patient Medicare Wellness Visit Mode:: Telephone If telephone:: video declined Since this visit was completed virtually, some vitals may be partially provided or unavailable. Missing vitals are due to the limitations of the virtual format.: Unable to obtain vitals - no equipment If Telephone or Video please confirm:: I connected with patient using audio/video enable telemedicine. I verified patient identity with two identifiers, discussed telehealth limitations, and patient agreed to proceed. Patient Location:: home Provider Location:: home Interpreter Needed?: No Pre-visit prep was completed: no AWV questionnaire completed by patient prior to visit?: no Living arrangements:: lives with spouse/significant other Patient's Overall Health Status Rating: good Typical amount of pain: none Does pain affect daily life?: no Are you currently prescribed opioids?: no  Dietary Habits and Nutritional Risks How many meals a day?: 2 Eats fruit and vegetables daily?: yes Most meals are obtained by: preparing own meals; eating out In the last 2 weeks, have you had any of the following?: none Diabetic:: no  Functional Status Activities of Daily Living (to include ambulation/medication): Independent Ambulation: Independent with device- listed below Home Assistive Devices/Equipment: Walker (specify Type) Medication Administration: Independent Home Management (perform basic housework or laundry): Independent Manage your own finances?: yes Primary transportation is:  family / friends Concerns about vision?: no *vision screening is required for WTM*  Fall Screening Falls in the past year?: 0 Number of falls in past year: 0 Was there an injury with Fall?: 0 Fall Risk Category Calculator: 0 Patient Fall Risk Level: Low Fall Risk  Fall Risk Patient at Risk for Falls Due to: No Fall Risks Fall risk Follow up: Falls evaluation completed; Education provided; Falls prevention discussed  Home and Transportation Safety: All rugs have non-skid backing?: N/A, no rugs All stairs or steps have railings?: yes Grab bars in the bathtub or shower?: yes Have non-skid surface in bathtub or shower?: (!) no Good home lighting?: yes Regular seat belt use?: yes Hospital stays in the last year:: no  Cognitive Assessment Difficulty concentrating, remembering, or making decisions? : no Will 6CIT or Mini Cog be Completed: yes What year is it?: 0 points What month is it?: 0 points Give patient an address phrase to remember (5 components): its very sunny outside today in January About what time is it?: 0 points Count backwards from 20 to 1: 0 points Say the months of the year in reverse: 0 points Repeat the address phrase from earlier: 0 points 6 CIT Score: 0 points  Advance Directives (For Healthcare) Does Patient Have a Medical Advance Directive?: No  Reviewed/Updated  Reviewed/Updated: Reviewed All (Medical, Surgical, Family, Medications, Allergies, Care Teams, Patient Goals); Surgical History; Family History; Medications; Allergies; Care Teams; Patient Goals; Medical History    Allergies (verified) Patient has no known allergies.   Current Medications (verified) Outpatient Encounter Medications as of 08/01/2024  Medication Sig   cholecalciferol (VITAMIN D3) 25 MCG (1000 UNIT) tablet Take 2,000 Units by mouth daily.   famotidine  (PEPCID ) 20 MG tablet Take 20 mg by mouth daily as needed for heartburn or indigestion.   hydrOXYzine  (ATARAX ) 25 MG  tablet TAKE  1/2 TO 1 (ONE-HALF TO ONE) TABLET BY MOUTH ONCE DAILY AS NEEDED FOR ITCHING   levothyroxine  (SYNTHROID ) 88 MCG tablet Take 1 tablet (88 mcg total) by mouth daily before breakfast. (Patient taking differently: Take 88 mcg by mouth daily before breakfast. 1Tablet twice weekly and a 1/2 tablet five times weekly)   Menthol , Topical Analgesic, (BIOFREEZE ROLL-ON) 4 % GEL Apply 1 application. topically daily as needed (pain).   Multiple Vitamins-Minerals (PRESERVISION AREDS 2 PO) Take 1 capsule by mouth 2 (two) times daily.   XARELTO  20 MG TABS tablet TAKE 1 TABLET EVERY DAY WITH SUPPER   No facility-administered encounter medications on file as of 08/01/2024.    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   Procedure: RIGHT TOTAL HIP ARTHROPLASTY ANTERIOR APPROACH;  Surgeon: Jerri Kay HERO, MD;  Location: [facility] OR;  Service: Orthopedics;  Laterality: Right;  3-C   TUBAL LIGATION     Family History  Problem Relation Age of Onset   Congestive Heart Failure Father    Cancer Sister        breast   Breast cancer Sister    Colon polyps Sister    Cancer Maternal Grandmother    Breast cancer Maternal Aunt    Stomach cancer Maternal Aunt    Colon cancer Neg Hx    Esophageal cancer Neg Hx    Rectal cancer Neg Hx    Social History   Occupational History   Not on file  Tobacco Use   Smoking status: Former    Current packs/day: 0.00    Types: Cigarettes    Quit date: 03/04/2005    Years since quitting: 19.4   Smokeless tobacco: Never  Vaping Use   Vaping status: Never Used  Substance and Sexual Activity   Alcohol use: No    Drug use: No   Sexual activity: Never    Birth control/protection: Surgical   Tobacco Counseling Counseling given: Not Answered  SDOH Screenings   Food Insecurity: No Food Insecurity (08/01/2024)  Housing: Low Risk (08/01/2024)  Transportation Needs: No Transportation Needs (08/01/2024)  Utilities: Not At Risk (08/01/2024)  Alcohol Screen: Low Risk (04/06/2023)  Depression (PHQ2-9): Low Risk (08/01/2024)  Financial Resource Strain: Low Risk (04/14/2024)  Physical Activity: Unknown (08/01/2024)  Social Connections: Moderately Isolated (08/01/2024)  Stress: No Stress Concern Present (08/01/2024)  Tobacco Use: Medium Risk (08/01/2024)  Health Literacy: Adequate Health Literacy (08/01/2024)   See flowsheets for full screening details  Depression Screen PHQ 2 & 9 Depression Scale- Over the past 2 weeks, how often have you been bothered by any of the following problems? Little interest or pleasure in doing things: 0 Feeling down, depressed, or hopeless (PHQ Adolescent also includes...irritable): 0 PHQ-2 Total Score: 0 Trouble falling or staying asleep, or sleeping too much: 0 Feeling tired or having little energy: 0 Poor appetite or overeating (PHQ Adolescent also includes...weight loss): 0 Feeling bad about yourself - or that you are a failure or have let yourself or your family down:  0 Trouble concentrating on things, such as reading the newspaper or watching television [facility] Adolescent also includes...like school work): 0 Moving or speaking so slowly that other people could have noticed. Or the opposite - being so fidgety or restless that you have been moving around a lot more than usual: 0 Thoughts that you would be better off dead, or of hurting yourself in some way: 0 PHQ-9 Total Score: 0 If you checked off any problems, how difficult have these problems made it for you to do your work, take care of things at home, or get along with other people?: Not difficult at all     Goals Addressed              This Visit's Progress    Patient Stated   On track    Be able to move around easily     Patient Stated   On track    Continue current lifestyle     Patient Stated       Continue current lifestyle             Objective:    Today's Vitals   08/01/24 1132  Weight: 119 lb (54 kg)  Height: 5' 2 (1.575 m)   Body mass index is 21.77 kg/m.  Hearing/Vision screen Hearing Screening - Comments:: No trouble hearing Vision Screening - Comments:: Retina Md   [place] Immunizations and Health Maintenance Health Maintenance  Topic Date Due   Influenza Vaccine  10/01/2024 (Originally 02/02/2024)   Medicare Annual Wellness (AWV)  08/01/2025   Pneumococcal Vaccine: 50+ Years  Completed   Bone Density Scan  Completed   Meningococcal B Vaccine  Aged Out   DTaP/Tdap/Td  Discontinued   Colonoscopy  Discontinued   COVID-19 Vaccine  Discontinued   Zoster Vaccines- Shingrix  Discontinued        Assessment/Plan:  This is a routine wellness examination for Kelly Mcdowell.  Patient Care Team: Levora Reyes SAUNDERS, MD as PCP - General (Family Medicine) Lavona Agent, MD as PCP - Cardiology (Cardiology) Octavia Bruckner, MD as Consulting Physician (Ophthalmology)  I have personally reviewed and noted the following in the patients chart:   Medical and social history Use of alcohol, tobacco or illicit drugs  Current medications and supplements including opioid prescriptions. Functional ability and status Nutritional status Physical activity Advanced directives List of other physicians Hospitalizations, surgeries, and ER visits in previous 12 months Vitals Screenings to include cognitive, depression, and falls Referrals and appointments  No orders of the defined types were placed in this encounter.  In addition, I have reviewed and discussed with patient certain preventive protocols, quality metrics, and best practice recommendations. A written personalized care  plan for preventive services as well as general preventive health recommendations were provided to patient.   Mliss Graff, LPN   [id number]/[id number]   Return in 1 year (on 08/01/2025).  After Visit Summary: (MyChart) Due to this being a telephonic visit, the after visit summary with patients personalized plan was offered to patient via MyChart   Nurse Notes:  "

## 2024-10-02 ENCOUNTER — Ambulatory Visit: Admitting: Family Medicine
